# Patient Record
Sex: Male | Born: 1955
Health system: Southern US, Community
[De-identification: ages and names within clinical notes are randomized; demographics above are authoritative.]

## PROBLEM LIST (undated history)

## (undated) DIAGNOSIS — I10 Essential (primary) hypertension: Secondary | ICD-10-CM

## (undated) DIAGNOSIS — H9190 Unspecified hearing loss, unspecified ear: Secondary | ICD-10-CM

## (undated) DIAGNOSIS — F419 Anxiety disorder, unspecified: Secondary | ICD-10-CM

## (undated) DIAGNOSIS — M1990 Unspecified osteoarthritis, unspecified site: Secondary | ICD-10-CM

## (undated) DIAGNOSIS — K219 Gastro-esophageal reflux disease without esophagitis: Secondary | ICD-10-CM

## (undated) DIAGNOSIS — J189 Pneumonia, unspecified organism: Secondary | ICD-10-CM

## (undated) DIAGNOSIS — J449 Chronic obstructive pulmonary disease, unspecified: Secondary | ICD-10-CM

## (undated) DIAGNOSIS — E7800 Pure hypercholesterolemia, unspecified: Secondary | ICD-10-CM

## (undated) DIAGNOSIS — G4730 Sleep apnea, unspecified: Secondary | ICD-10-CM

## (undated) DIAGNOSIS — D649 Anemia, unspecified: Secondary | ICD-10-CM

## (undated) DIAGNOSIS — I2510 Atherosclerotic heart disease of native coronary artery without angina pectoris: Secondary | ICD-10-CM

## (undated) DIAGNOSIS — R519 Headache, unspecified: Secondary | ICD-10-CM

## (undated) DIAGNOSIS — C801 Malignant (primary) neoplasm, unspecified: Secondary | ICD-10-CM

## (undated) DIAGNOSIS — R51 Headache: Secondary | ICD-10-CM

## (undated) DIAGNOSIS — Z972 Presence of dental prosthetic device (complete) (partial): Secondary | ICD-10-CM

## (undated) DIAGNOSIS — Z8719 Personal history of other diseases of the digestive system: Secondary | ICD-10-CM

## (undated) DIAGNOSIS — R0600 Dyspnea, unspecified: Secondary | ICD-10-CM

## (undated) HISTORY — PX: CARDIAC CATHETERIZATION: SHX172

## (undated) HISTORY — PX: OTHER SURGICAL HISTORY: SHX169

## (undated) HISTORY — PX: APPENDECTOMY: SHX54

## (undated) HISTORY — PX: CATARACT EXTRACTION: SUR2

## (undated) HISTORY — PX: TONSILLECTOMY: SUR1361

## (undated) HISTORY — PX: CAROTID ENDARTERECTOMY: SUR193

## (undated) HISTORY — PX: CORONARY ARTERY BYPASS GRAFT: SHX141

---

## 2008-01-27 ENCOUNTER — Ambulatory Visit: Payer: Self-pay | Admitting: Gastroenterology

## 2008-11-01 ENCOUNTER — Ambulatory Visit: Payer: Self-pay | Admitting: Gastroenterology

## 2008-11-08 ENCOUNTER — Ambulatory Visit: Payer: Self-pay | Admitting: Gastroenterology

## 2011-06-04 ENCOUNTER — Ambulatory Visit: Payer: Self-pay

## 2011-06-26 ENCOUNTER — Ambulatory Visit: Payer: Self-pay

## 2011-11-25 ENCOUNTER — Ambulatory Visit: Payer: Self-pay | Admitting: Gastroenterology

## 2012-04-25 ENCOUNTER — Ambulatory Visit: Payer: Self-pay | Admitting: Physician Assistant

## 2012-04-25 ENCOUNTER — Observation Stay: Payer: Self-pay | Admitting: Surgery

## 2012-04-25 LAB — CBC WITH DIFFERENTIAL/PLATELET
Basophil: 2 %
Comment - H1-Com1: NORMAL
Eosinophil #: 0 10*3/uL (ref 0.0–0.7)
Eosinophil %: 0.2 %
HCT: 44 % (ref 40.0–52.0)
HGB: 15.2 g/dL (ref 13.0–18.0)
Lymphocyte #: 1.6 10*3/uL (ref 1.0–3.6)
Lymphocyte %: 7.1 %
Lymphocytes: 5 %
MCH: 32.3 pg (ref 26.0–34.0)
MCHC: 34.6 g/dL (ref 32.0–36.0)
Monocyte %: 6.1 %
Neutrophil %: 86 %
Platelet: 213 10*3/uL (ref 150–440)
RBC: 4.71 10*6/uL (ref 4.40–5.90)
Segmented Neutrophils: 89 %
WBC: 23.4 10*3/uL — ABNORMAL HIGH (ref 3.8–10.6)

## 2012-04-25 LAB — AMYLASE: Amylase: 53 U/L (ref 25–115)

## 2012-04-25 LAB — URINALYSIS, COMPLETE
Bilirubin,UR: NEGATIVE
Leukocyte Esterase: NEGATIVE
Ph: 7 (ref 4.5–8.0)
Protein: 30
RBC,UR: 6 /HPF (ref 0–5)
Specific Gravity: 1.015 (ref 1.003–1.030)
Squamous Epithelial: NONE SEEN
WBC UR: <1 /HPF (ref 0–5)

## 2012-04-25 LAB — COMPREHENSIVE METABOLIC PANEL
Albumin: 3.9 g/dL (ref 3.4–5.0)
Alkaline Phosphatase: 69 U/L (ref 50–136)
Anion Gap: 10 (ref 7–16)
BUN: 15 mg/dL (ref 7–18)
Chloride: 98 mmol/L (ref 98–107)
Creatinine: 0.97 mg/dL (ref 0.60–1.30)
EGFR (African American): >60
Osmolality: 274 (ref 275–301)
Potassium: 3.7 mmol/L (ref 3.5–5.1)
SGOT(AST): 22 U/L (ref 15–37)
Total Protein: 7.6 g/dL (ref 6.4–8.2)

## 2012-04-27 LAB — PATHOLOGY REPORT

## 2012-05-27 ENCOUNTER — Ambulatory Visit: Payer: Self-pay | Admitting: Vascular Surgery

## 2012-05-27 LAB — CREATININE, SERUM: EGFR (Non-African Amer.): >60

## 2012-12-08 DIAGNOSIS — I739 Peripheral vascular disease, unspecified: Secondary | ICD-10-CM | POA: Insufficient documentation

## 2013-12-06 DIAGNOSIS — S92302B Fracture of unspecified metatarsal bone(s), left foot, initial encounter for open fracture: Secondary | ICD-10-CM | POA: Insufficient documentation

## 2014-01-13 ENCOUNTER — Ambulatory Visit: Payer: Self-pay | Admitting: Vascular Surgery

## 2014-01-19 ENCOUNTER — Ambulatory Visit: Payer: Self-pay | Admitting: Vascular Surgery

## 2014-01-19 LAB — CREATININE, SERUM
Creatinine: 0.93 mg/dL (ref 0.60–1.30)
EGFR (African American): >60
EGFR (Non-African Amer.): >60

## 2014-01-19 LAB — BUN: BUN: 17 mg/dL (ref 7–18)

## 2014-02-22 ENCOUNTER — Ambulatory Visit: Payer: Self-pay | Admitting: Vascular Surgery

## 2014-02-22 LAB — BASIC METABOLIC PANEL
ANION GAP: 3 — AB (ref 7–16)
BUN: 12 mg/dL (ref 7–18)
CALCIUM: 8.6 mg/dL (ref 8.5–10.1)
CHLORIDE: 104 mmol/L (ref 98–107)
Co2: 32 mmol/L (ref 21–32)
Creatinine: 0.98 mg/dL (ref 0.60–1.30)
EGFR (African American): >60
EGFR (Non-African Amer.): >60
GLUCOSE: 95 mg/dL (ref 65–99)
Osmolality: 277 (ref 275–301)
Potassium: 3.8 mmol/L (ref 3.5–5.1)
Sodium: 139 mmol/L (ref 136–145)

## 2014-02-22 LAB — URINALYSIS, COMPLETE
BACTERIA: NONE SEEN
BLOOD: NEGATIVE
Bilirubin,UR: NEGATIVE
Glucose,UR: NEGATIVE mg/dL (ref 0–75)
KETONE: NEGATIVE
LEUKOCYTE ESTERASE: NEGATIVE
Nitrite: NEGATIVE
Ph: 8 (ref 4.5–8.0)
Protein: NEGATIVE
Specific Gravity: 1.011 (ref 1.003–1.030)
Squamous Epithelial: NONE SEEN

## 2014-02-22 LAB — CBC
HCT: 41.7 % (ref 40.0–52.0)
HGB: 14.2 g/dL (ref 13.0–18.0)
MCH: 31.8 pg (ref 26.0–34.0)
MCHC: 34.1 g/dL (ref 32.0–36.0)
MCV: 93 fL (ref 80–100)
Platelet: 241 10*3/uL (ref 150–440)
RBC: 4.47 10*6/uL (ref 4.40–5.90)
RDW: 14.1 % (ref 11.5–14.5)
WBC: 8.6 10*3/uL (ref 3.8–10.6)

## 2014-03-01 ENCOUNTER — Inpatient Hospital Stay: Payer: Self-pay | Admitting: Vascular Surgery

## 2014-03-02 LAB — BASIC METABOLIC PANEL
Anion Gap: 4 — ABNORMAL LOW (ref 7–16)
BUN: 13 mg/dL (ref 7–18)
CREATININE: 0.96 mg/dL (ref 0.60–1.30)
Calcium, Total: 7.6 mg/dL — ABNORMAL LOW (ref 8.5–10.1)
Chloride: 106 mmol/L (ref 98–107)
Co2: 29 mmol/L (ref 21–32)
EGFR (African American): >60
EGFR (Non-African Amer.): >60
Glucose: 123 mg/dL — ABNORMAL HIGH (ref 65–99)
Osmolality: 279 (ref 275–301)
POTASSIUM: 3.9 mmol/L (ref 3.5–5.1)
Sodium: 139 mmol/L (ref 136–145)

## 2014-03-02 LAB — CBC WITH DIFFERENTIAL/PLATELET
BASOS ABS: 0 10*3/uL (ref 0.0–0.1)
Basophil %: 0.2 %
EOS ABS: 0 10*3/uL (ref 0.0–0.7)
Eosinophil %: 0 %
HCT: 35 % — ABNORMAL LOW (ref 40.0–52.0)
HGB: 12.1 g/dL — AB (ref 13.0–18.0)
Lymphocyte #: 1.3 10*3/uL (ref 1.0–3.6)
Lymphocyte %: 10 %
MCH: 32.2 pg (ref 26.0–34.0)
MCHC: 34.6 g/dL (ref 32.0–36.0)
MCV: 93 fL (ref 80–100)
MONO ABS: 0.9 x10 3/mm (ref 0.2–1.0)
Monocyte %: 7 %
Neutrophil #: 10.7 10*3/uL — ABNORMAL HIGH (ref 1.4–6.5)
Neutrophil %: 82.8 %
PLATELETS: 210 10*3/uL (ref 150–440)
RBC: 3.77 10*6/uL — ABNORMAL LOW (ref 4.40–5.90)
RDW: 13.5 % (ref 11.5–14.5)
WBC: 13 10*3/uL — ABNORMAL HIGH (ref 3.8–10.6)

## 2014-03-02 LAB — PROTIME-INR
INR: 1.1
PROTHROMBIN TIME: 14 s (ref 11.5–14.7)

## 2014-03-02 LAB — APTT: ACTIVATED PTT: 28.1 s (ref 23.6–35.9)

## 2014-03-03 LAB — PATHOLOGY REPORT

## 2014-07-06 DIAGNOSIS — K219 Gastro-esophageal reflux disease without esophagitis: Secondary | ICD-10-CM | POA: Insufficient documentation

## 2014-07-06 DIAGNOSIS — F419 Anxiety disorder, unspecified: Secondary | ICD-10-CM | POA: Insufficient documentation

## 2014-12-19 DIAGNOSIS — G4733 Obstructive sleep apnea (adult) (pediatric): Secondary | ICD-10-CM | POA: Insufficient documentation

## 2015-01-10 DIAGNOSIS — L409 Psoriasis, unspecified: Secondary | ICD-10-CM | POA: Insufficient documentation

## 2015-01-16 ENCOUNTER — Ambulatory Visit: Payer: Self-pay | Admitting: Ophthalmology

## 2015-01-19 DIAGNOSIS — I447 Left bundle-branch block, unspecified: Secondary | ICD-10-CM | POA: Insufficient documentation

## 2015-01-23 ENCOUNTER — Ambulatory Visit: Payer: Self-pay | Admitting: Internal Medicine

## 2015-01-25 ENCOUNTER — Ambulatory Visit: Payer: Self-pay | Admitting: Ophthalmology

## 2015-02-06 DIAGNOSIS — I2510 Atherosclerotic heart disease of native coronary artery without angina pectoris: Secondary | ICD-10-CM | POA: Insufficient documentation

## 2015-03-31 NOTE — Op Note (Signed)
PATIENT NAME:  Charles Blake, Charles Blake MR#:  [id number] DATE OF BIRTH:  02/25/1956  DATE OF PROCEDURE:  01/19/2014  PREOPERATIVE DIAGNOSES:  1.  Peripheral arterial disease with claudication, bilateral lower extremities, left worse than right.  2.  Carotid disease.  3.  Tobacco dependence.  4.  Hypertension.  POSTOPERATIVE DIAGNOSES: 1.  Peripheral arterial disease with claudication, bilateral lower extremities, left worse than right.  2.  Carotid disease.  3.  Tobacco dependence.  4.  Hypertension.  PROCEDURES:  1.  Ultrasound guidance for vascular access to bilateral femoral arteries.  2.  Catheter placement to aorta from bilateral femoral approaches.  3.  Aortogram and iliofemoral arteriogram.  4.  Kissing balloon angioplasty with 6 mm Lutonix drug-coated balloons to bilateral common iliac arteries and distal aorta.  5.  StarClose closure device, bilateral femoral artery.   SURGEON: Leotis Pain, M.D.   ANESTHESIA: Local with moderate conscious sedation.   ESTIMATED BLOOD LOSS: 25 mL.  FLUOROSCOPY TIME: Approximately 4 minutes.   INDICATION FOR PROCEDURE: This is a 59 year old male with extensive atherosclerotic peripheral arterial disease. He has undergone previous interventions. Despite his young age, he has recurrent claudication symptoms particularly in the left. Noninvasive study showed bilateral common iliac artery elevated velocities and irregularity in the distal aorta. He is brought in for angiography for further evaluation and potential treatment. Risks and benefits were discussed. Informed consent was obtained.   DESCRIPTION OF PROCEDURE: The patient is brought to the vascular interventional radiology suite. Groins were draped and prepped, and a sterile surgical field was created. Ultrasound was used to visualize the femoral arteries. These are accessed under direct ultrasound guidance without difficulty with a Seldinger needle and permanent image was recorded. A 6-French sheath  was placed bilaterally. Intravenous heparin  4000 units given for systemic anticoagulation. Pigtail catheter was placed in the aorta and an AP aortogram of the pelvic obliques performed. This showed normal origins of the visceral vessels. The aorta had moderate irregularity in its distal segment. The left common iliac artery had a moderate stenosis in the 65% to 70% range proximally above the previously placed left iliac stent and there was moderate irregularity within the right common iliac stent with the degree of stenosis difficult to opacify. This appeared to be in the 60% range. The lesions were crossed without difficulty. Magic torque wires were placed bilaterally. 6 mm diameter Lutonix drug-coated balloons were inflated in the proximal common iliac arteries bilaterally encompassing both lesions going down into the stent in the left common iliac artery, encompassing almost the entirety of the stent in the right common iliac artery. These also went to the aorta for several centimeters and the distal aorta was treated as well. These were inflated to burst pressure. The balloons were then deflated and completion angiogram was performed. This showed significant improvement with less than 30% residual stenosis in the left common iliac artery, less than 20% residual stenosis in the right iliac artery and a mild residual stenosis in the 30% to 40% range in the distal aorta. At this point, I elected to terminate the procedure. Sheaths were removed. StarClose closure device was deployed in the usual fashion with excellent hemostatic result. The patient tolerated the procedure well and was taken to the recovery room in stable condition.    ____________________________ Charles Huxley, MD jsd:aw D: 01/19/2014 10:51:54 ET T: 01/19/2014 11:20:57 ET JOB#: [id number]  cc: Charles Huxley, MD, <Dictator> Charles Huxley MD ELECTRONICALLY SIGNED 01/25/2014 10:51

## 2015-03-31 NOTE — Op Note (Signed)
PATIENT NAME:  Charles Blake, COUPER MR#:  [id number] DATE OF BIRTH:  [birth year]  DATE OF PROCEDURE:  03/01/2014  PREOPERATIVE DIAGNOSES: 1. High-grade left carotid artery stenosis.  2. Moderate right carotid artery stenosis.  3. Peripheral vascular disease.  4. Tobacco dependence.   POSTOPERATIVE DIAGNOSES:  1. High-grade left carotid artery stenosis.  2. Moderate right carotid artery stenosis.  3. Peripheral vascular disease.  4. Tobacco dependence.  PROCEDURE: Left carotid endarterectomy with CorMatrix arterial patch reconstruction.   SURGEON: Algernon Huxley, M.D.   ANESTHESIA: General.   ESTIMATED BLOOD LOSS: 150 mL.   INDICATION FOR PROCEDURE: This is a 59 year old gentleman with recent diagnosis of high-grade left carotid artery stenosis, moderate right carotid artery stenosis and peripheral vascular disease, which has already been treated. He was offered carotid endarterectomy for stroke risk reduction and desired to proceed. Risks and benefits were discussed.   DESCRIPTION OF PROCEDURE: The patient is brought to the operative suite, and after an adequate level of general anesthesia was obtained, he was placed in modified beach chair position. His neck was flexed. A roll was placed under shoulders and his neck and chest were sterilely prepped and draped. Incision was then created along the anterior border of the sternocleidomastoid and we dissected through the platysma with electrocautery Wheatland retractor was used to help facilitate our exposure. He had thick, deep neck and two larger Wheatlanders were necessary. Several crossing veins were ligated and divided between silk ties. The facial vein was ligated and divided between silk ties. This exposed the carotid bifurcation. The common carotid artery, external carotid, superior thyroid artery and internal carotid artery and distal lesion were dissected out. The lesion tracked well into the internal carotid artery and we had to ligate  several branches around the hypoglossal nerve to move it out of the way and protect it. The patient was heparinized with 7000 units of intravenous heparin. Control was pulled up on the vessel loops. An anterior wall arteriotomy was created with an 11 blade and extended with Potts scissors. The Pruitt-Inahara shunt was placed first in the internal carotid artery, flushed and de-aired, then in the common carotid artery. Approximately four minutes elapsed from clamp until restoration of flow. I then performed endarterectomy in a standard fashion. The proximal endpoint was cut flush with tenotomy scissors. An eversion endarterectomy was performed on the external carotid artery and a nice feathered distal endpoint was created on the internal carotid artery. The internal carotid artery distal endpoint was tacked down with two 7-0 Prolene sutures and all loose flecks were removed. The vessel was locally heparinized. A CorMatrix patch was brought onto the field, and I cut and beveled. A 6-0 Prolene was started at the distal endpoint and run half the length of the arteriotomy medially and laterally. Then cut and beveled to an appropriate length to match the arteriotomy. A second 6-0 Prolene was started at the proximal endpoint. The medial suture line was run together and tied. The lateral suture line was run together until the shunt had to be removed. The shunt was removed. The vessel was flushed in the internal, external and common carotid arteries and locally heparinized. I then completed the suture line flushing through the external carotid artery with several cardiac cycles prior to releasing control. Approximately three minutes elapsed from shunt removal to restoration of flow. Two 6-0 Prolene patches were used for hemostasis Surgicel and Evicel were placed. The wound was then closed with 3 interrupted 3-0 Vicryl sternocleidomastoid space. The platysma was  closed with a running 3-0 Vicryl and the skin was closed with  4-0 Monocryl. Dermabond was placed as a dressing. The patient was awakened from anesthesia and taken to the recovery room in stable condition, having tolerated the procedure well.   ____________________________ Algernon Huxley, MD jsd:sg D: 03/01/2014 10:26:00 ET T: 03/01/2014 11:53:40 ET JOB#: [id number]  cc: Algernon Huxley, MD, <Dictator> Sofie Hartigan, MD  Algernon Huxley MD ELECTRONICALLY SIGNED 03/13/2014 9:49

## 2015-04-01 NOTE — Consult Note (Signed)
Patient admitted with acute appendicitis. Had appendectomy and is going home today.  Found to have aortoiliac disease on his Ct scan, which I have independently reviewed.  This shows what appears to be a right iliac occlusion, left iliac stenosis, and some aortic disease.  He does have short distance claudication and has atherosclerotic risk factors including current tobacco use.  Discussed pathophysiology and natural history of PAD.  Will see in office as an outpatient and schedule ABIs for further evaluation.    Electronic Signatures: Algernon Huxley (MD)  (Signed on 20-May-13 11:48)  Authored  Last Updated: 20-May-13 11:48 by Algernon Huxley (MD)

## 2015-04-01 NOTE — Consult Note (Signed)
PATIENT NAME:  Charles Blake, Charles Blake MR#:  [id number] DATE OF BIRTH:  07-13-1956  DATE OF CONSULTATION:  04/26/2012  REFERRING PHYSICIAN:   CONSULTING PHYSICIAN:  Algernon Huxley, MD  REASON FOR CONSULTATION: Peripheral arterial disease with claudication.   HISTORY OF PRESENT ILLNESS: This is a patient admitted with acute appendicitis who had an appendectomy yesterday and is going home today. On his CT scan is mention of aortoiliac arterial disease. I have independently reviewed this scan. This appears to show a right iliac occlusion, left iliac stenosis, and some aortic disease.  In discussion with the patient he does have reasonably short-distance claudication. He does not have rest pain or tissue loss. He has multiple atherosclerotic risk factors including ongoing tobacco use and the importance of tobacco cessation was discussed with him today. We discussed the pathophysiology and natural history of peripheral arterial disease as well today and he does seem to fit the profile.   PAST MEDICAL HISTORY:  1. Tobacco use. 2. Hypertension.  HOME MEDICATIONS: Amlodipine, Hyzaar, Lexapro, and Protonix.   ALLERGIES: None known.   FAMILY HISTORY: Noncontributory.   SOCIAL HISTORY: Positive alcohol and tobacco use. 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   REVIEW OF SYSTEMS:  GENERAL: No fevers or chills though he did have his appendix taken out yesterday. EYES: No blurred or double vision.  EARS:  No tinnitus or ear pain. CARDIOVASCULAR: No chest pain or palpitations. RESPIRATORY: No shortness breath or cough. GI: Positive for recent appendectomy. GU: No dysuria or hematuria. ENDOCRINE: No heat or cold intolerance. PSYCH: No anxiety or depression. NEURO: No transient ischemic attack, stroke, or seizure. MUSCULOSKELETAL: Positive for claudication as described above.     PHYSICAL EXAMINATION:  GENERAL: This is well-developed, well-nourished white male not in apparent distress.   VITAL SIGNS: Temperature 98.9,  pulse 94, blood pressure 120/72, saturations are 94%.   HEAD: Normocephalic, atraumatic.   EYES: Sclerae anicteric. Conjunctivae are clear.   EARS: Normal external appearance. Hearing is intact.   NECK: Neck is supple without adenopathy or jugular venous distention.   HEART: Regular rate and rhythm without murmurs, rubs, gallops.   LUNGS: Clear to auscultation bilaterally.   ABDOMEN: Soft. He has some mild tenderness around the incision, which is expected. He does not have rebound or guarding.   EXTREMITIES: His right femoral pulse is not palpable. His left femoral pulse is 1+. His right pedal pulses are trace and his left pedal pulses are 1+. Both feet have good capillary refill.   NEUROLOGIC: Normal strength and tone in all four extremities.   PSYCH: No anxiety or depression.   SKIN: Warm and dry.   LABORATORY, DIAGNOSTIC, AND RADIOLOGICAL DATA: Admission labs showing sodium 136, potassium 3.7, chloride 98, CO2 28, BUN 15, creatinine 0.97, glucose 127. White blood cell count is 23.4, hemoglobin 15.2, platelet count 213,000. CT scan of the abdomen and pelvis was performed which I have independently reviewed and described above.   ASSESSMENT AND PLAN: This is a 59 year old white male with what sounds like reasonably short distance hip and buttock claudication from aortoiliac disease. Right now he will need to recover from his appendectomy and we will arrange to see him in the office with ABIs for further followup.    Smoking cessation was discussed the patient as was lifestyle modifications for peripheral arterial disease.   This is a level-4 consultation.    ____________________________ Algernon Huxley, MD jsd:bjt D: 05/13/2012 13:41:15 ET T: 05/13/2012 14:16:51 ET JOB#: [id number]  cc: Corene Cornea  Bunnie Domino, MD, <Dictator> Algernon Huxley MD ELECTRONICALLY SIGNED 05/27/2012 9:04

## 2015-04-01 NOTE — H&P (Signed)
History of Present Illness Started having RLQ abdominal pain associated with nausea and anorexia this AM - getting worse. No fever. Last meal 6PM last night.   Past Med/Surgical Hx:  Hypertension:   ALLERGIES:  No Known Allergies:   HOME MEDICATIONS: Medication Instructions Status  amlodipine  orally  Active  Hyzaar  orally  Active  Lexapro  orally  Active  Protonix  orally  Active   Family and Social History:   Family History Non-Contributory    Social History positive tobacco (Greater than 1 year), positive ETOH, 1 ppd, works at [employer]   Review of Systems:   Subjective/Chief Complaint has classic 50 yard vbilateral buttock and hip claudication    Fever/Chills No    Cough No    Sputum No    Abdominal Pain Yes    Diarrhea No    Constipation No    Nausea/Vomiting Yes    SOB/DOE No    Chest Pain No    Dysuria No    Tolerating PT Yes    Tolerating Diet No  Nauseated    Medications/Allergies Reviewed Medications/Allergies reviewed   Physical Exam:   GEN well developed, well nourished, no acute distress    HEENT pink conjunctivae, PERRL, hearing intact to voice, moist oral mucosa, Oropharynx clear    NECK supple  thyroid not tender  trachea midline    RESP normal resp effort  clear BS    CARD regular rate  no murmur  no Rub    ABD positive tenderness  rebound and guarding RLQ    LYMPH negative neck    EXTR negative edema    SKIN normal to palpation, No ulcers, skin turgor good    NEURO cranial nerves intact, follows commands, strength:, motor/sensory function intact    PSYCH alert, A+O to time, place, person, good insight   Routine UA:  19-May-13 15:44    Color (UA) Yellow   Clarity (UA) Clear   Glucose (UA) Negative   Bilirubin (UA) Negative   Ketones (UA) 1+   Specific Gravity (UA) 1.015   Blood (UA) 1+   pH (UA) 7.0   Protein (UA) 30 mg/dL   Nitrite (UA) Negative   Leukocyte Esterase (UA)  Negative   RBC (UA) 6 /HPF   WBC (UA) <1 /HPF   Radiology Results: CT:    19-May-13 16:15, CT Abdomen and Pelvis With Contrast   CT Abdomen and Pelvis With Contrast   REASON FOR EXAM:    (1) RLQ pain; (2) RLQ pain  COMMENTS:       PROCEDURE: CT  - CT ABDOMEN / PELVIS  W  - Apr 25 2012  4:15PM     RESULT: Comparison:  None    Technique: Multiple axial images of the abdomen and pelvis were performed   from the lung bases to the pubic symphysis, with p.o. contrast and with   100 mL of Isovue 370 intravenous contrast.    Findings:  There is a small hiatal hernia. Minimal low-attenuation along the   falciform ligament likely represents focal fatty deposition. The   gallbladder, spleen, adrenals, and pancreas are unremarkable. The kidneys     enhance normally.    The small and large bowel are normal in caliber. There are several   prominent perirectal vessels, which is nonspecific. There is an   appendicolith inthe base of the appendix. The appendix is enlarged,   measuring 14 mm in  diameter. There is mild adjacent inflammatory   stranding. No discrete extraluminal fluid collection identified. There is   a fat-containing left inguinal hernia. There is a prominent degree of fat   in the right inguinal canal. Atherosclerotic plaques are demonstrated in   the abdominal aorta and iliac arteries. There is a high-grade stenosis in   the right common iliac artery.    No aggressive lytic or sclerotic osseous lesions are identified. There   are pars defects at L5 bilaterally.    IMPRESSION:   1. Acute appendicitis.  This was called to Dr. Graciella Freer at 1620   hours 04/25/2012.  2. High-grade stenosis in the right common iliac artery.          Verified By: Gregor Hams, M.D., MD     Assessment/Admission Diagnosis Acute appendicitis Aortoiliac PVOD    Plan Lap appy Vascular consult in AM   Electronic Signatures: Consuela Mimes (MD)  (Signed 19-May-13  16:50)  Authored: CHIEF COMPLAINT and HISTORY, PAST MEDICAL/SURGIAL HISTORY, ALLERGIES, HOME MEDICATIONS, FAMILY AND SOCIAL HISTORY, REVIEW OF SYSTEMS, PHYSICAL EXAM, LABS, Radiology, ASSESSMENT AND PLAN   Last Updated: 19-May-13 16:50 by Consuela Mimes (MD)

## 2015-04-01 NOTE — Op Note (Signed)
PATIENT NAME:  Charles Blake, Charles Blake MR#:  [id number] DATE OF BIRTH:  [birth year]  DATE OF PROCEDURE:  04/25/2012  PREOPERATIVE DIAGNOSIS: Acute appendicitis.   POSTOPERATIVE DIAGNOSIS: Acute appendicitis, gangrenous.   OPERATION PERFORMED: Laparoscopic appendectomy.   SURGEON: Consuela Mimes, M.D.   ANESTHESIA: General.   PROCEDURE IN DETAIL: The patient was placed supine on the operating room table and prepped and draped in the usual sterile fashion. A limited midline incision was made above the umbilicus for the insertion of a Hassan cannula and after cutting through the linea alba a preperitoneal artery was cut which necessitated enlargement of the skin incision and slight enlargement of the fascial incision. Multiple hemoclips were placed on this artery as well as a 3-0 silk ligature and then the fascia was partially closed with running 0 PDS suture and the peritoneum was entered and the Hassan cannula was introduced and a 15 mmHg CO2 pneumoperitoneum was created. Two additional 5 mm trocars were placed under direct visualization. The patient was noted to have a gangrenous appendix with minimal fluid surrounding it, but no true perforation. The mesoappendix was taken down with the Harmonic scalpel and again appendicular artery necessitated clipping with endoscopic hemoclips and then the appendectomy was performed right at the junction of the appendiceal base and the cecum with an Endo GIA stapling device. The appendix was placed in an Endo Catch bag and extracted from the abdomen via the epigastric port. The right lower quadrant was irrigated with warm normal saline. This was suctioned clear including the pelvis. Hemostasis was excellent. The peritoneum was desufflated and decannulated and the linea alba closure with a running 0 PDS          was completed and all three skin sites were closed with skin staples and sterile dressings were applied. The patient tolerated the procedure well.  There were no complications.  ____________________________ Consuela Mimes, MD wfm:slb D: 04/25/2012 19:18:00 ET T: 04/26/2012 08:42:01 ET JOB#: [id number]  cc: Consuela Mimes, MD, <Dictator> Consuela Mimes MD ELECTRONICALLY SIGNED 04/28/2012 9:20

## 2015-04-01 NOTE — Op Note (Signed)
PATIENT NAME:  Charles Blake, Charles Blake MR#:  [id number] DATE OF BIRTH:  03/04/1956  DATE OF PROCEDURE:  05/27/2012  PREOPERATIVE DIAGNOSES:  1. Peripheral arterial disease with claudication bilateral lower extremities.  2. Hypertension.   POSTOPERATIVE DIAGNOSES: 1. Peripheral arterial disease with claudication bilateral lower extremities.  2. Hypertension.  PROCEDURES:  1. Ultrasound guidance for vascular access, left femoral artery.  2. Catheter placement to right femoral artery from left femoral approach.  3. Aortogram and selective right lower extremity angiogram.  4. Percutaneous transluminal angioplasty of bilateral iliac arteries with 7 mm diameter angioplasty balloon.  5. Balloon expandable stent placement to proximal right common iliac artery with 7 mm diameter x 4 cm length balloon expandable stent.  6. Self-expanding stent placement to distal left common iliac artery at the hypogastric origin with an 8 mm diameter self-expanding stent, both of these for greater than 50% residual stenosis after angioplasty.  7. StarClose closure device left femoral artery.   SURGEON: Algernon Huxley, MD   ANESTHESIA: Local with moderate conscious sedation.   ESTIMATED BLOOD LOSS: Minimal.   INDICATION FOR PROCEDURE: This is a gentleman who was referred for claudication. He had significantly reduced ABIs bilaterally and aortoiliac disease had been seen on a previous CT although this was not a CT angiogram so evaluation was somewhat limited. His claudication was lifestyle limiting and he desired revascularization. Risks and benefits were discussed. Informed consent was obtained.   DESCRIPTION OF PROCEDURE: The patient was brought to the Vascular Interventional Radiology Suite. Groins were shaved and prepped and a sterile surgical field was created. The left femoral head was localized with fluoroscopy and the left femoral artery was accessed without difficulty with a Seldinger needle under direct ultrasound  guidance. Permanent image was recorded. J-wire and 5 French sheath were placed. There was a near occlusion in the distal left common iliac artery seen on a retrograde arteriogram through the left femoral sheath. I was able to cross this lesion without difficulty with the Terumo advantage wire and park a pigtail catheter in the aorta. Aortogram showed significant disease was seen in the aorta but this was not flow-limiting. The right common iliac artery occluded approximately 1 cm beyond the origin and there was a near occlusion in the distal left common iliac artery. I took a rim catheter with the advantage wire and was able to cross this occlusion with surprisingly little difficulty and confirm intraluminal flow in the right femoral artery. Right lower extremity angiogram showed no significant infrarenal disease. I then parked a Terumo advantage wire while in the superficial femoral artery. Balloon angioplasty was performed of both iliacs separately with a 7 mm diameter angioplasty balloon and both iliacs had a greater than 50% residual stenosis after angioplasty. Due to locations, a balloon expandable stent was placed on the right and a self-expanding stent was placed on the left. The balloon expandable was a 7 x 4 and the self-expanding stent was an 8 x 4. The self-expanding stent was ironed out with a 7 mm balloon. Completion angiogram after the stent placement showed widely patent stents without significant residual stenosis and StarClose closure device was deployed in the usual fashion with excellent hemostatic result. The patient tolerated the procedure well and was taken to the recovery room in stable condition.   ____________________________ Algernon Huxley, MD jsd:drc D: 05/27/2012 12:58:55 ET T: 05/27/2012 13:07:39 ET JOB#: [id number]  cc: Algernon Huxley, MD, <Dictator> Algernon Huxley MD ELECTRONICALLY SIGNED 06/03/2012 9:22

## 2015-04-08 NOTE — Op Note (Signed)
PATIENT NAME:  Charles Blake, Charles Blake MR#:  [id number] DATE OF BIRTH:  October 06, 1956  DATE OF PROCEDURE:  01/25/2015  PREOPERATIVE DIAGNOSIS:  Nuclear sclerotic cataract, left eye. Diagnosis code H25.12.  POSTOPERATIVE DIAGNOSIS:  Nuclear sclerotic cataract, left eye. Diagnosis code H25.12.  ANESTHESIA: Topical anesthesia.  PROCEDURE:  Phacoemulsification with posterior chamber intraocular lens left eye, model SN60WF  SURGEON:  Lyla Glassing, MD  INDICATIONS:  This is a 59 year old male with decreased vision in the left eye.  PROCEDURE:  The risks and benefits of cataract surgery were discussed at length with the patient, including bleeding, infection, retinal detachment, re-operation, diplopia, ptosis, loss of vision, and loss of the eye. Informed consent was obtained. On the day of surgery, several sets of preoperative medication were administered to the operative eye including 0.5% tetracaine,1% cyclopentolate, 10% phenylephrine, 0.5% ketorolac, 0.5% gatifloxacin, and 2% lidocaine .  The patient was taken to the operating room and sedated via IV sedation. Topical tetracaine was placed in the eye. The operative eye was prepped using a diluted 10% Betadine solution and then covered in sterile drapes leaving only the operative eye exposed. A Lieberman lid speculum was placed to provide exposure. Using 0.12 forceps and a sideport blade, a paracentesis was created. Then a mixture of BSS, preservative free lidocaine, and epinephrine was injected into the anterior chamber. Next, a 2.4 mm keratome blade was used to create a two-step full-thickness clear corneal incision temporally. The cystitome and Utrata forceps were used to create a continuous capsulorrhexis in the anterior lens capsule. BSS on a hydrodissection cannula was used to perform gentle hydrodissection. Phacoemulsification was then performed to remove the nucleus. Irrigation and aspiration was performed to remove the remaining cortical material.  Provisc was injected to fill the capsular bag and anterior chamber. A 23.5-diopter SN60WF intraocular lens was injected into the capsular bag. The Connor wand was used to rotate it into proper position in the capsular bag. Irrigation and aspiration was performed to remove the remaining Viscoelastic material from the eye. BSS on a 30-gauge cannula was used to hydrate the wound. An intracameral antibiotic was administered. The wounds were checked and found to be watertight. The lid speculum and drapes were carefully removed. Several drops of Vigamox were placed in the operative eye. The eye was covered with protective eyewear. The patient was taken to the recovery area in good condition. There were no complications.   ____________________________ Lyla Glassing, MD nm:mw D: 01/25/2015 10:56:07 ET T: 01/25/2015 11:17:08 ET JOB#: [id number]  cc: Lyla Glassing, MD, <Dictator> Lyla Glassing MD ELECTRONICALLY SIGNED 02/01/2015 7:41

## 2015-05-28 ENCOUNTER — Encounter: Payer: Self-pay | Admitting: *Deleted

## 2015-05-29 ENCOUNTER — Encounter
Admission: RE | Disposition: A | Discharge status: Home / Self Care | Payer: Self-pay | Source: Ambulatory Visit | Attending: Gastroenterology

## 2015-05-29 ENCOUNTER — Ambulatory Visit: Payer: BLUE CROSS/BLUE SHIELD | Admitting: Anesthesiology

## 2015-05-29 ENCOUNTER — Ambulatory Visit
Admission: RE | Admit: 2015-05-29 | Discharge: 2015-05-29 | Disposition: A | Discharge status: Home / Self Care | Payer: BLUE CROSS/BLUE SHIELD | Source: Ambulatory Visit | Attending: Gastroenterology | Admitting: Gastroenterology

## 2015-05-29 DIAGNOSIS — Z79899 Other long term (current) drug therapy: Secondary | ICD-10-CM | POA: Insufficient documentation

## 2015-05-29 DIAGNOSIS — K219 Gastro-esophageal reflux disease without esophagitis: Secondary | ICD-10-CM | POA: Insufficient documentation

## 2015-05-29 DIAGNOSIS — Z800 Family history of malignant neoplasm of digestive organs: Secondary | ICD-10-CM | POA: Diagnosis not present

## 2015-05-29 DIAGNOSIS — K5730 Diverticulosis of large intestine without perforation or abscess without bleeding: Secondary | ICD-10-CM | POA: Insufficient documentation

## 2015-05-29 DIAGNOSIS — Z7982 Long term (current) use of aspirin: Secondary | ICD-10-CM | POA: Insufficient documentation

## 2015-05-29 DIAGNOSIS — K621 Rectal polyp: Secondary | ICD-10-CM | POA: Diagnosis not present

## 2015-05-29 DIAGNOSIS — Z8371 Family history of colonic polyps: Secondary | ICD-10-CM | POA: Insufficient documentation

## 2015-05-29 DIAGNOSIS — M1990 Unspecified osteoarthritis, unspecified site: Secondary | ICD-10-CM | POA: Diagnosis not present

## 2015-05-29 DIAGNOSIS — K449 Diaphragmatic hernia without obstruction or gangrene: Secondary | ICD-10-CM | POA: Insufficient documentation

## 2015-05-29 DIAGNOSIS — K648 Other hemorrhoids: Secondary | ICD-10-CM | POA: Diagnosis not present

## 2015-05-29 DIAGNOSIS — Z1211 Encounter for screening for malignant neoplasm of colon: Secondary | ICD-10-CM | POA: Insufficient documentation

## 2015-05-29 DIAGNOSIS — I10 Essential (primary) hypertension: Secondary | ICD-10-CM | POA: Diagnosis not present

## 2015-05-29 HISTORY — PX: COLONOSCOPY: SHX5424

## 2015-05-29 HISTORY — DX: Personal history of other diseases of the digestive system: Z87.19

## 2015-05-29 HISTORY — DX: Essential (primary) hypertension: I10

## 2015-05-29 HISTORY — DX: Gastro-esophageal reflux disease without esophagitis: K21.9

## 2015-05-29 HISTORY — DX: Unspecified osteoarthritis, unspecified site: M19.90

## 2015-05-29 SURGERY — COLONOSCOPY
Anesthesia: General

## 2015-05-29 MED ORDER — SODIUM CHLORIDE 0.9 % IV SOLN: INTRAVENOUS | Status: DC

## 2015-05-29 MED ORDER — FENTANYL CITRATE (PF) 100 MCG/2ML IJ SOLN
INTRAMUSCULAR | Status: DC | PRN
  Administered 2015-05-29: 50 ug via INTRAVENOUS

## 2015-05-29 MED ORDER — MIDAZOLAM HCL 2 MG/2ML IJ SOLN
INTRAMUSCULAR | Status: DC | PRN
  Administered 2015-05-29: 1 mg via INTRAVENOUS

## 2015-05-29 NOTE — Anesthesia Postprocedure Evaluation (Signed)
  Anesthesia Post-op Note  Patient: Charles Blake  Procedure(s) Performed: Procedure(s): COLONOSCOPY (N/A)  Anesthesia type:General  Patient location: PACU  Post pain: Pain level controlled  Post assessment: Post-op Vital signs reviewed, Patient's Cardiovascular Status Stable, Respiratory Function Stable, Patent Airway and No signs of Nausea or vomiting  Post vital signs: Reviewed and stable  Last Vitals:  Filed Vitals:   05/29/15 1420  BP: 138/86  Pulse: 62  Temp:   Resp: 18    Level of consciousness: awake, alert  and patient cooperative  Complications: No apparent anesthesia complications

## 2015-05-29 NOTE — H&P (Signed)
Outpatient short stay form Pre-procedure 05/29/2015 1:20 PM Lollie Sails MD   Primary Physician: Dr Thereasa Distance  Reason for visit:  colonoscopy  History of present illness:  Patient with a family history of colon cancer and colon polyps. last colonoscopy was 11/ 2012. Tolerated his prep well. He is not taking any anticoagulation or aspirin medications.    Current facility-administered medications:  .  0.9 %  sodium chloride infusion, , Intravenous, Continuous, Lollie Sails, MD  Prescriptions prior to admission  Medication Sig Dispense Refill Last Dose  . acetaminophen (TYLENOL) 650 MG CR tablet Take 650 mg by mouth every 8 (eight) hours as needed for pain.     Marland Kitchen amLODipine (NORVASC) 10 MG tablet Take 10 mg by mouth daily.   05/29/2015 at 0700  . aspirin 81 MG tablet Take 81 mg by mouth daily.   05/29/2015 at 0700  . carvedilol (COREG) 3.125 MG tablet Take 3.125 mg by mouth 2 (two) times daily with a meal.   05/29/2015 at 0700  . clonazePAM (KLONOPIN) 0.5 MG tablet Take 0.5 mg by mouth at bedtime.   05/29/2015 at 0700  . escitalopram (LEXAPRO) 20 MG tablet Take 20 mg by mouth daily.   05/29/2015 at 0700  . losartan-hydrochlorothiazide (HYZAAR) 100-25 MG per tablet Take 1 tablet by mouth daily.   05/29/2015 at 0700  . pantoprazole (PROTONIX) 40 MG tablet Take 40 mg by mouth daily.   05/29/2015 at 0700  . pravastatin (PRAVACHOL) 80 MG tablet Take 80 mg by mouth daily.     . tadalafil (CIALIS) 20 MG tablet Take 20 mg by mouth daily as needed for erectile dysfunction.   Not Taking at Unknown time     Allergies  Allergen Reactions  . Ace Inhibitors      Past Medical History  Diagnosis Date  . Hypertension   . GERD (gastroesophageal reflux disease)   . Arthritis   . History of hiatal hernia     Review of systems:      Physical Exam    Heart and lungs: Regular rate and rhythm without rub or gallop lungs are bilaterally clear    HEENT: Norm cephalic atraumatic eyes  are anicteric    Other:     Pertinant exam for procedure: Soft nontender nondistended bowel sounds positive normoactive    Planned proceedures: Colonoscopy and indicated procedures I have discussed the risks benefits and complications of procedures to include not limited to bleeding, infection, perforation and the risk of sedation and the patient wishes to proceed.    Lollie Sails, MD Gastroenterology 05/29/2015  1:20 PM

## 2015-05-29 NOTE — Anesthesia Preprocedure Evaluation (Addendum)
Anesthesia Evaluation  Patient identified by MRN, date of birth, ID band Patient awake    Reviewed: Allergy & Precautions, NPO status , Patient's Chart, lab work & pertinent test results, reviewed documented beta blocker date and time   History of Anesthesia Complications Negative for: history of anesthetic complications  Airway Mallampati: II  TM Distance: >3 FB Neck ROM: Full    Dental  (+) Upper Dentures   Pulmonary sleep apnea and Continuous Positive Airway Pressure Ventilation ,  breath sounds clear to auscultation  Pulmonary exam normal       Cardiovascular hypertension, Pt. on medications and Pt. on home beta blockers + CAD Normal cardiovascular examRhythm:Regular Rate:Normal     Neuro/Psych negative neurological ROS  negative psych ROS   GI/Hepatic Neg liver ROS, hiatal hernia, GERD-  Medicated and Controlled,  Endo/Other  negative endocrine ROS  Renal/GU negative Renal ROS  negative genitourinary   Musculoskeletal  (+) Arthritis -, Osteoarthritis,    Abdominal   Peds negative pediatric ROS (+)  Hematology negative hematology ROS (+)   Anesthesia Other Findings   Reproductive/Obstetrics negative OB ROS                           Anesthesia Physical Anesthesia Plan  ASA: III  Anesthesia Plan: General   Post-op Pain Management:    Induction: Intravenous  Airway Management Planned: Nasal Cannula  Additional Equipment:   Intra-op Plan:   Post-operative Plan:   Informed Consent: I have reviewed the patients History and Physical, chart, labs and discussed the procedure including the risks, benefits and alternatives for the proposed anesthesia with the patient or authorized representative who has indicated his/her understanding and acceptance.     Plan Discussed with: CRNA and Surgeon  Anesthesia Plan Comments:        Anesthesia Quick Evaluation

## 2015-05-29 NOTE — Transfer of Care (Signed)
Immediate Anesthesia Transfer of Care Note  Patient: Charles Blake  Procedure(s) Performed: Procedure(s): COLONOSCOPY (N/A)  Patient Location: PACU  Anesthesia Type:General  Level of Consciousness: awake and alert   Airway & Oxygen Therapy: Patient Spontanous Breathing and Patient connected to nasal cannula oxygen  Post-op Assessment: Report given to RN  Post vital signs: stable  Last Vitals:  Filed Vitals:   05/29/15 1350  BP: 134/74  Pulse: 80  Temp: 36.3 C  Resp: 18    Complications: No apparent anesthesia complications

## 2015-05-29 NOTE — Op Note (Signed)
[facility] Gastroenterology Patient Name: Charles Blake Procedure Date: 05/29/2015 1:08 PM MRN: [id number] Account #: [id number] Date of Birth: 1956/03/02 Admit Type: Outpatient Age: 59 Room: [facility] ENDO ROOM 3 Gender: Male Note Status: Finalized Procedure:         Colonoscopy Indications:       Colon cancer screening in patient at increased risk:                     Family history of colon polyps, Screening in patient at                     increased risk: Family history of 1st-degree relative with                     colorectal cancer Providers:         Lollie Sails, MD Referring MD:      Sofie Hartigan (Referring MD) Medicines:         Monitored Anesthesia Care Complications:     No immediate complications. Procedure:         Pre-Anesthesia Assessment:                    - ASA Grade Assessment: II - A patient with mild systemic                     disease.                    After obtaining informed consent, the colonoscope was                     passed under direct vision. Throughout the procedure, the                     patient's blood pressure, pulse, and oxygen saturations                     were monitored continuously. The Colonoscope was                     introduced through the anus and advanced to the the cecum,                     identified by appendiceal orifice and ileocecal valve. The                     colonoscopy was performed without difficulty. The patient                     tolerated the procedure well. The quality of the bowel                     preparation was good. Findings:      A few small-mouthed diverticula were found in the sigmoid colon and in       the descending colon.      A less than 1 mm polyp was found in the rectum. The polyp was sessile.       The polyp was removed with a cold biopsy forceps. Resection and       retrieval were complete.      The digital rectal exam was normal.      Non-bleeding internal  hemorrhoids were found during retroflexion. The       hemorrhoids were small.  No additional abnormalities were found on retroflexion. Impression:        - Diverticulosis in the sigmoid colon and in the                     descending colon.                    - One less than 1 mm polyp in the rectum. Resected and                     retrieved.                    - Non-bleeding internal hemorrhoids. Recommendation:    - Await pathology results.                    - Telephone GI clinic for pathology results in 1 week. Procedure Code(s): --- Professional ---                    [phone], Colonoscopy, flexible; with biopsy, single or                     multiple Diagnosis Code(s): --- Professional ---                    V16.0, Family history of malignant neoplasm of                     gastrointestinal tract                    V18.51, Family history of colonic polyps                    569.0, Anal and rectal polyp                    455.0, Internal hemorrhoids without mention of complication                    562.10, Diverticulosis of colon (without mention of                     hemorrhage) CPT copyright 2014 American Medical Association. All rights reserved. The codes documented in this report are preliminary and upon coder review may  be revised to meet current compliance requirements. Lollie Sails, MD 05/29/2015 1:47:01 PM This report has been signed electronically. Number of Addenda: 0 Note Initiated On: 05/29/2015 1:08 PM Scope Withdrawal Time: 0 hours 7 minutes 55 seconds  Total Procedure Duration: 0 hours 11 minutes 12 seconds       [facility]

## 2015-05-30 ENCOUNTER — Encounter: Payer: Self-pay | Admitting: Gastroenterology

## 2015-05-30 LAB — SURGICAL PATHOLOGY

## 2015-06-14 DIAGNOSIS — I10 Essential (primary) hypertension: Secondary | ICD-10-CM | POA: Insufficient documentation

## 2015-11-02 ENCOUNTER — Encounter: Payer: Self-pay | Admitting: Emergency Medicine

## 2015-11-02 ENCOUNTER — Ambulatory Visit
Admission: EM | Admit: 2015-11-02 | Discharge: 2015-11-02 | Disposition: A | Discharge status: Home / Self Care | Payer: BLUE CROSS/BLUE SHIELD | Attending: Family Medicine | Admitting: Family Medicine

## 2015-11-02 DIAGNOSIS — J0101 Acute recurrent maxillary sinusitis: Secondary | ICD-10-CM

## 2015-11-02 DIAGNOSIS — H6983 Other specified disorders of Eustachian tube, bilateral: Secondary | ICD-10-CM | POA: Diagnosis not present

## 2015-11-02 MED ORDER — AMOXICILLIN-POT CLAVULANATE 875-125 MG PO TABS: 1.0000 | ORAL_TABLET | Freq: Two times a day (BID) | ORAL | Status: DC

## 2015-11-02 MED ORDER — IBUPROFEN 800 MG PO TABS
800.0000 mg | ORAL_TABLET | Freq: Once | ORAL | Status: AC
  Administered 2015-11-02: 800 mg via ORAL

## 2015-11-02 NOTE — ED Provider Notes (Signed)
CSN: [id number]     Arrival date & time 11/02/15  1253 History   First MD Initiated Contact with Patient 11/02/15 1327     Chief Complaint  Patient presents with  . URI  . Otalgia   (Consider location/radiation/quality/duration/timing/severity/associated sxs/prior Treatment) HPI 59 yo M presents feeling "miserable'. Has had cough and congestion URI 10 days - past 5 days  increasing with ear pressure R>L, eyes draining green, evening fevers,throat pain, facial pressure. Still smoking ! Has PCP - Dr Ellison Hughs    Past Medical History  Diagnosis Date  . Hypertension   . GERD (gastroesophageal reflux disease)   . Arthritis   . History of hiatal hernia    Past Surgical History  Procedure Laterality Date  . Tonsillectomy    . Colonoscopy N/A 05/29/2015    Procedure: COLONOSCOPY;  Surgeon: Lollie Sails, MD;  Location: [facility] ENDOSCOPY;  Service: Endoscopy;  Laterality: N/A;   No family history on file. Social History  Substance Use Topics  . Smoking status: Current Every Day Smoker    Types: Cigarettes  . Smokeless tobacco: None  . Alcohol Use: No    Review of Systems.10 Systems reviewed and are negative for acute change except as noted in the HPI.  Allergies  Ace inhibitors  Home Medications   Prior to Admission medications   Medication Sig Start Date End Date Taking? Authorizing Provider  acetaminophen (TYLENOL) 650 MG CR tablet Take 650 mg by mouth every 8 (eight) hours as needed for pain.   Yes Historical Provider, MD  amLODipine (NORVASC) 10 MG tablet Take 10 mg by mouth daily.   Yes Historical Provider, MD  aspirin 81 MG tablet Take 81 mg by mouth daily.   Yes Historical Provider, MD  carvedilol (COREG) 3.125 MG tablet Take 3.125 mg by mouth 2 (two) times daily with a meal.   Yes Historical Provider, MD  clonazePAM (KLONOPIN) 0.5 MG tablet Take 0.5 mg by mouth at bedtime.   Yes Historical Provider, MD  escitalopram (LEXAPRO) 20 MG tablet Take 20 mg by mouth daily.    Yes Historical Provider, MD  losartan-hydrochlorothiazide (HYZAAR) 100-25 MG per tablet Take 1 tablet by mouth daily.   Yes Historical Provider, MD  pantoprazole (PROTONIX) 40 MG tablet Take 40 mg by mouth daily.   Yes Historical Provider, MD  pravastatin (PRAVACHOL) 80 MG tablet Take 80 mg by mouth daily.   Yes Historical Provider, MD  tadalafil (CIALIS) 20 MG tablet Take 20 mg by mouth daily as needed for erectile dysfunction.   Yes Historical Provider, MD  amoxicillin-clavulanate (AUGMENTIN) 875-125 MG tablet Take 1 tablet by mouth every 12 (twelve) hours. 11/02/15   Jan Fireman, PA-C   Meds Ordered and Administered this Visit   Medications  ibuprofen (ADVIL,MOTRIN) tablet 800 mg (800 mg Oral Given 11/02/15 1405)  pt. preferred over IM- well tolerated  BP 118/67 mmHg  Pulse 81  Temp(Src) 98.7 F (37.1 C) (Tympanic)  Resp 18  Ht 5\' 7"  (1.702 m)  Wt 196 lb (88.905 kg)  BMI 30.69 kg/m2  SpO2 98% No data found.   Physical Exam   Constitutional -alert and oriented, tired appearing  General: Mild URI distress Head-atraumatic, normocephalic- discomfort with percussion sinuses frontal and maxillary Eyes- conjunctiva normal, EOMI ,conjugate gaze- tear ducts draining purulent discharge, denies tenderness Ears: Right ear feels blocked- hearing reported decreased- fluid behind TM, TM bulge, dull; left canal neg and TM dull Nose- nasal  congestion and rhinorrhea Mouth/throat- mucous membranes moist ,  oropharynx non-erythematous Neck- supple without glandular enlargement CV- regular rate and rhythmn Resp-no distress, normal respiratory effort,clear to auscultation bilaterally GI- soft,non-tender,no distention GU-  not examined MSK- non tender, normal ROM, ambulatory, no gait instability,on /off table solo Neuro- normal speech and language, no gross focal neurological deficit appreciated,  Skin-warm,dry ,intact;  Psych-mood and affect grossly normal; speech and behavior grossly  normal  ED Course  Procedures (including critical care time)  Labs Review Labs Reviewed - No data to display  Imaging Review No results found.   MDM   1. Acute recurrent maxillary sinusitis   2. Eustachian tube dysfunction, bilateral     Discussed therapeutic interventions- Strongly encouraged to stop/cut back smoking esp during acute illness- Techniques reviewed- pleased/encouraged/will try Reports issues with hearing decrease noted at job site- to f/u with PCP after Rx complete May need ENTconsultation /intervention Add Flonase BID x 3 then daily  Add Claritin BID x 3 then daily Add Augmentin Add guaifenesin DM Tylenol/ibuprofen- steam- increase fluids Diagnosis and treatment discussed.  Questions fielded, expectations and recommendations reviewed.Discussed follow up and return parameters including no resolution or any worsening condition.. Patient expresses understanding  and agrees to plan. Will return to [facility] with questions, concerns or exacerbation.    Discharge Medication List as of 11/02/2015  2:02 PM    START taking these medications   Details  amoxicillin-clavulanate (AUGMENTIN) 875-125 MG tablet Take 1 tablet by mouth every 12 (twelve) hours., Starting 11/02/2015, Until Discontinued, Print        Jan Fireman, PA-C 11/05/15 [phone]

## 2015-11-02 NOTE — Discharge Instructions (Signed)
Antibiotic by mouth twice daily x 10 days  Flonase ( fluticasone) 1 spray per nosrtril twice daily for 3 days then daily  Claritin ( loratadine) 10 mg 1 tablet daily   Robitussin DM  ( guaifenesin DM ) 1 tsp every 4 hours   Increased liquids by mouth---increased warm steamy showers- vaporizer  SECOND HALF OF EVERY CIGARETTE IS MINE !!!! Start " Buy Back " with yourself  !!!    Barotitis Media Barotitis media is inflammation of your middle ear. This occurs when the auditory tube (eustachian tube) leading from the back of your nose (nasopharynx) to your eardrum is blocked. This blockage may result from a cold, environmental allergies, or an upper respiratory infection. Unresolved barotitis media may lead to damage or hearing loss (barotrauma), which may become permanent. HOME CARE INSTRUCTIONS   Use medicines as recommended by your health care provider. Over-the-counter medicines will help unblock the canal and can help during times of air travel.  Do not put anything into your ears to clean or unplug them. Eardrops will not be helpful.  Do not swim, dive, or fly until your health care provider says it is all right to do so. If these activities are necessary, chewing gum with frequent, forceful swallowing may help. It is also helpful to hold your nose and gently blow to pop your ears for equalizing pressure changes. This forces air into the eustachian tube.  Only take over-the-counter or prescription medicines for pain, discomfort, or fever as directed by your health care provider.  A decongestant may be helpful in decongesting the middle ear and make pressure equalization easier. SEEK MEDICAL CARE IF:  You experience a serious form of dizziness in which you feel as if the room is spinning and you feel nauseated (vertigo).  Your symptoms only involve one ear. SEEK IMMEDIATE MEDICAL CARE IF:   You develop a severe headache, dizziness, or severe ear pain.  You have bloody or  pus-like drainage from your ears.  You develop a fever.  Your problems do not improve or become worse. MAKE SURE YOU:   Understand these instructions.  Will watch your condition.  Will get help right away if you are not doing well or get worse.   This information is not intended to replace advice given to you by your health care provider. Make sure you discuss any questions you have with your health care provider.   Document Released: 11/21/2000 Document Revised: 09/14/2013 Document Reviewed: 06/21/2013 Elsevier Interactive Patient Education [employer].

## 2015-11-02 NOTE — ED Notes (Signed)
Cough, congested, runny nose, ears stopped up for 5 days

## 2015-11-05 ENCOUNTER — Encounter: Payer: Self-pay | Admitting: Physician Assistant

## 2017-01-27 DIAGNOSIS — G4733 Obstructive sleep apnea (adult) (pediatric): Secondary | ICD-10-CM | POA: Diagnosis not present

## 2017-02-02 ENCOUNTER — Ambulatory Visit
Admission: EM | Admit: 2017-02-02 | Discharge: 2017-02-02 | Disposition: A | Discharge status: Home / Self Care | Payer: BLUE CROSS/BLUE SHIELD | Attending: Emergency Medicine | Admitting: Emergency Medicine

## 2017-02-02 ENCOUNTER — Encounter: Payer: Self-pay | Admitting: *Deleted

## 2017-02-02 ENCOUNTER — Ambulatory Visit (INDEPENDENT_AMBULATORY_CARE_PROVIDER_SITE_OTHER): Payer: BLUE CROSS/BLUE SHIELD

## 2017-02-02 DIAGNOSIS — R05 Cough: Secondary | ICD-10-CM | POA: Diagnosis not present

## 2017-02-02 DIAGNOSIS — R059 Cough, unspecified: Secondary | ICD-10-CM

## 2017-02-02 DIAGNOSIS — J189 Pneumonia, unspecified organism: Secondary | ICD-10-CM

## 2017-02-02 MED ORDER — HYDROCOD POLST-CPM POLST ER 10-8 MG/5ML PO SUER: 5.0000 mL | Freq: Every evening | ORAL | 0 refills | Status: DC | PRN

## 2017-02-02 MED ORDER — IPRATROPIUM-ALBUTEROL 0.5-2.5 (3) MG/3ML IN SOLN
3.0000 mL | Freq: Four times a day (QID) | RESPIRATORY_TRACT | Status: DC
  Administered 2017-02-02: 3 mL via RESPIRATORY_TRACT

## 2017-02-02 MED ORDER — PREDNISONE 10 MG PO TABS: ORAL_TABLET | ORAL | 0 refills | Status: DC

## 2017-02-02 MED ORDER — BENZONATATE 100 MG PO CAPS: 100.0000 mg | ORAL_CAPSULE | Freq: Three times a day (TID) | ORAL | 0 refills | Status: DC | PRN

## 2017-02-02 MED ORDER — AMOXICILLIN-POT CLAVULANATE ER 1000-62.5 MG PO TB12: 2.0000 | ORAL_TABLET | Freq: Two times a day (BID) | ORAL | 0 refills | Status: AC

## 2017-02-02 MED ORDER — DOXYCYCLINE HYCLATE 100 MG PO CAPS: 100.0000 mg | ORAL_CAPSULE | Freq: Two times a day (BID) | ORAL | 0 refills | Status: AC

## 2017-02-02 MED ORDER — ALBUTEROL SULFATE HFA 108 (90 BASE) MCG/ACT IN AERS: 2.0000 | INHALATION_SPRAY | RESPIRATORY_TRACT | 0 refills | Status: AC | PRN

## 2017-02-02 NOTE — ED Triage Notes (Signed)
Productive cough- clear, fever, chills, body aches, x1 week.

## 2017-02-02 NOTE — Discharge Instructions (Signed)
Take medication as prescribed. Take with food. Rest. Drink plenty of fluids.   Follow up with your primary care physician this week as discussed. Return to Urgent care or Emergency room for new or worsening concerns.

## 2017-02-02 NOTE — ED Provider Notes (Signed)
[facility] ____________________________________________  Time seen: Approximately 8:30 AM  I have reviewed the triage vital signs and the nursing notes.   HISTORY  Chief Complaint Cough; Fever; Chills; and Generalized Body Aches  HPI Charles Blake is a 61 y.o. male presenting for the complaints of approximately 2 weeks of runny nose, nasal congestion and cough. Patient reports that initially his wife had something very similar to influenza, and reports he then contacted shortly after her. Patient reports he has had intermittent fevers. Reports last fever was 100 yesterday. Reports some nasal congestion continues, but she needs with cough and chest congestion. Reports cough is a hacking cough, occasionally productive. Denies hemoptysis. Reports occasionally wheezing. Reports does have a history of COPD and intermittently has wheezing at baseline, as well as a smoker. Denies chest pain or shortness of breath. Reports has continued to remain active. Reports continues to eat and drink well. Denies pain at this time. Reports symptoms unresolved with over-the-counter cough and congestion medications.  Denies chest pain, shortness of breath, abdominal pain, dysuria, extremity pain, extremity swelling or rash. Denies recent sickness. Denies recent antibiotic use. Denies renal insufficiency.  [facility], DALE E, MD: PCP    Past Medical History:  Diagnosis Date  . Arthritis   . GERD (gastroesophageal reflux disease)   . History of hiatal hernia   . Hypertension     There are no active problems to display for this patient.   Past Surgical History:  Procedure Laterality Date  . COLONOSCOPY N/A 05/29/2015   Procedure: COLONOSCOPY;  Surgeon: Lollie Sails, MD;  Location: [facility] ENDOSCOPY;  Service: Endoscopy;  Laterality: N/A;  . TONSILLECTOMY        Current Facility-Administered Medications:  .  ipratropium-albuterol (DUONEB) 0.5-2.5 (3) MG/3ML nebulizer solution 3  mL, 3 mL, Nebulization, Q6H, Marylene Land, NP, 3 mL at 02/02/17 0847  Current Outpatient Prescriptions:  .  acetaminophen (TYLENOL) 650 MG CR tablet, Take 650 mg by mouth every 8 (eight) hours as needed for pain., Disp: , Rfl:  .  amLODipine (NORVASC) 10 MG tablet, Take 10 mg by mouth daily., Disp: , Rfl:  .  aspirin 81 MG tablet, Take 81 mg by mouth daily., Disp: , Rfl:  .  carvedilol (COREG) 3.125 MG tablet, Take 3.125 mg by mouth 2 (two) times daily with a meal., Disp: , Rfl:  .  clonazePAM (KLONOPIN) 0.5 MG tablet, Take 0.5 mg by mouth at bedtime., Disp: , Rfl:  .  escitalopram (LEXAPRO) 20 MG tablet, Take 20 mg by mouth daily., Disp: , Rfl:  .  losartan-hydrochlorothiazide (HYZAAR) 100-25 MG per tablet, Take 1 tablet by mouth daily., Disp: , Rfl:  .  pantoprazole (PROTONIX) 40 MG tablet, Take 40 mg by mouth daily., Disp: , Rfl:  .  pravastatin (PRAVACHOL) 80 MG tablet, Take 80 mg by mouth daily., Disp: , Rfl:  .  albuterol (PROVENTIL HFA;VENTOLIN HFA) 108 (90 Base) MCG/ACT inhaler, Inhale 2 puffs into the lungs every 4 (four) hours as needed for wheezing., Disp: 1 Inhaler, Rfl: 0 .  amoxicillin-clavulanate (AUGMENTIN XR) 1000-62.5 MG 12 hr tablet, Take 2 tablets by mouth 2 (two) times daily., Disp: 28 tablet, Rfl: 0 .  benzonatate (TESSALON PERLES) 100 MG capsule, Take 1 capsule (100 mg total) by mouth 3 (three) times daily as needed for cough., Disp: 15 capsule, Rfl: 0 .  chlorpheniramine-HYDROcodone (TUSSIONEX PENNKINETIC ER) 10-8 MG/5ML SUER, Take 5 mLs by mouth at bedtime as needed for cough. do not drive or operate  machinery while taking as can cause drowsiness., Disp: 75 mL, Rfl: 0 .  doxycycline (VIBRAMYCIN) 100 MG capsule, Take 1 capsule (100 mg total) by mouth 2 (two) times daily., Disp: 14 capsule, Rfl: 0 .  predniSONE (DELTASONE) 10 MG tablet, Start 60 mg po day one, then 50 mg po day two, taper by 10 mg daily until complete., Disp: 21 tablet, Rfl: 0 .  tadalafil (CIALIS) 20 MG  tablet, Take 20 mg by mouth daily as needed for erectile dysfunction., Disp: , Rfl:   Allergies Patient has no known allergies.  History reviewed. No pertinent family history.  Social History Social History  Substance Use Topics  . Smoking status: Current Every Day Smoker    Types: Cigarettes  . Smokeless tobacco: Never Used  . Alcohol use No    Review of Systems Constitutional: As above.  Eyes: No visual changes. ENT: States occasional scratchy throat, denies sore throat.  Cardiovascular: Denies chest pain. Respiratory: Denies shortness of breath. Gastrointestinal: No abdominal pain.  No nausea, no vomiting.  No diarrhea.  No constipation. Genitourinary: Negative for dysuria. Musculoskeletal: Negative for back pain. Skin: Negative for rash. Neurological: Negative for headaches, focal weakness or numbness.  10-point ROS otherwise negative.  ____________________________________________   PHYSICAL EXAM:  VITAL SIGNS: ED Triage Vitals  Enc Vitals Group     BP 02/02/17 0816 135/66     Pulse Rate 02/02/17 0816 73     Resp 02/02/17 0816 16     Temp 02/02/17 0816 99.3 F (37.4 C)     Temp Source 02/02/17 0816 Oral     SpO2 02/02/17 0816 96 %     Weight 02/02/17 0818 200 lb (90.7 kg)     Height 02/02/17 0818 5\' 7"  (1.702 m)     Head Circumference --      Peak Flow --      Pain Score --      Pain Loc --      Pain Edu? --      Excl. in [place]? --     Constitutional: Alert and oriented. Well appearing and in no acute distress. Eyes: Conjunctivae are normal. PERRL. EOMI. Head: Atraumatic. No sinus tenderness to palpation. No swelling. No erythema.  Ears: no erythema, normal TMs bilaterally.   Nose:Nasal congestion with clear rhinorrhea  Mouth/Throat: Mucous membranes are moist. No pharyngeal erythema. No tonsillar swelling or exudate.  Neck: No stridor.  No cervical spine tenderness to palpation. Hematological/Lymphatic/Immunilogical: No cervical  lymphadenopathy. Cardiovascular: Normal rate, regular rhythm. Grossly normal heart sounds.  Good peripheral circulation. Respiratory: Normal respiratory effort.  No retractions. Good air movement. Scattered inspiratory wheezes. Mild scattered rhonchi. Dry intermittent cough noted in room.  Gastrointestinal: Soft and nontender. No CVA tenderness. Musculoskeletal: Ambulatory with steady gait. No cervical, thoracic or lumbar tenderness to palpation. Bilateral lower extremities nontender and no edema noted. Neurologic:  Normal speech and language. No gait instability. Skin:  Skin appears warm, dry and intact. No rash noted. Psychiatric: Mood and affect are normal. Speech and behavior are normal. ___________________________________________   LABS (all labs ordered are listed, but only abnormal results are displayed)  Labs Reviewed - No data to display ____________________________________________  RADIOLOGY  Dg Chest 2 View  Result Date: 02/02/2017 CLINICAL DATA:  Productive cough and wheezing with fever for the past 2 weeks. Current smoker. History of bronchitis -Celsius OPD. EXAM: CHEST  2 VIEW COMPARISON:  Chest x-ray of February 22, 2014 FINDINGS: The lungs are mildly hyperinflated. There is no focal  infiltrate but there is subtle increased density that projects over the posterior aspect of the mid thoracic spine on the lateral view. There is no pleural effusion or pneumothorax. The heart and pulmonary vascularity are normal. There is calcification in the wall of the aortic arch. There is mild multilevel degenerative disc disease of the thoracic spine. IMPRESSION: COPD. No definite acute pneumonia. Subtle nonspecific increased density projects over the posterior aspect of the T5-6 disc level which may reflect osteophyte but is more conspicuous than in the past. Followup PA and lateral chest X-ray is recommended in 3-4 weeks following trial of antibiotic therapy to ensure resolution and exclude  underlying malignancy. Electronically Signed   By: David  Martinique M.D.   On: 02/02/2017 09:12   ____________________________________________   PROCEDURES Procedures   INITIAL IMPRESSION / ASSESSMENT AND PLAN / ED COURSE  Pertinent labs & imaging results that were available during my care of the patient were reviewed by me and considered in my medical decision making (see chart for details).  Well-appearing patient. No acute distress. DuoNeb administered once. Will evaluate chest x-ray.  After duoneb, patient reevaluated, reports feeling better, wheezes fully resolved. Chest x-ray per radiologist reviewed, per radiologist, no definite acute pneumonia, subtle nonspecific increased density projects over the posterior aspect may reflect osteophyte but more conspicuous in the past, recommend follow-up post antibiotic to exclude underlying malignancy. Discussed in detail with patient regarding these results. As patient also with history CAD as well as complete left bundle branch block, and concern for acute pneumonia, will place patient on combination therapy of high-dose oral Augmentin and doxycycline, prednisone taper, when necessary albuterol inhaler. When necessary Tessalon and Tussionex as needed for cough. Discussed with patient strict follow-up and return parameters. Patient will follow up with PCP at the end of this week. Patient also has preplanned cardiology follow-up tomorrow.Discussed indication, risks and benefits of medications with patient. As patient take with food as well as discussed use of probiotics.  Discussed follow up with Primary care physician this week. Discussed follow up and return parameters including no resolution or any worsening concerns. Patient verbalized understanding and agreed to plan.   ____________________________________________   FINAL CLINICAL IMPRESSION(S) / ED DIAGNOSES  Final diagnoses:  Cough  Pneumonia, unspecified organism     New Prescriptions    ALBUTEROL (PROVENTIL HFA;VENTOLIN HFA) 108 (90 BASE) MCG/ACT INHALER    Inhale 2 puffs into the lungs every 4 (four) hours as needed for wheezing.   AMOXICILLIN-CLAVULANATE (AUGMENTIN XR) 1000-62.5 MG 12 HR TABLET    Take 2 tablets by mouth 2 (two) times daily.   BENZONATATE (TESSALON PERLES) 100 MG CAPSULE    Take 1 capsule (100 mg total) by mouth 3 (three) times daily as needed for cough.   CHLORPHENIRAMINE-HYDROCODONE (TUSSIONEX PENNKINETIC ER) 10-8 MG/5ML SUER    Take 5 mLs by mouth at bedtime as needed for cough. do not drive or operate machinery while taking as can cause drowsiness.   DOXYCYCLINE (VIBRAMYCIN) 100 MG CAPSULE    Take 1 capsule (100 mg total) by mouth 2 (two) times daily.   PREDNISONE (DELTASONE) 10 MG TABLET    Start 60 mg po day one, then 50 mg po day two, taper by 10 mg daily until complete.    Note: This dictation was prepared with Dragon dictation along with smaller phrase technology. Any transcriptional errors that result from this process are unintentional.         Marylene Land, NP 02/02/17 1114

## 2017-02-03 DIAGNOSIS — I447 Left bundle-branch block, unspecified: Secondary | ICD-10-CM | POA: Diagnosis not present

## 2017-02-03 DIAGNOSIS — I739 Peripheral vascular disease, unspecified: Secondary | ICD-10-CM | POA: Diagnosis not present

## 2017-02-03 DIAGNOSIS — R0602 Shortness of breath: Secondary | ICD-10-CM | POA: Diagnosis not present

## 2017-02-03 DIAGNOSIS — I25118 Atherosclerotic heart disease of native coronary artery with other forms of angina pectoris: Secondary | ICD-10-CM | POA: Diagnosis not present

## 2017-02-06 DIAGNOSIS — J189 Pneumonia, unspecified organism: Secondary | ICD-10-CM | POA: Diagnosis not present

## 2017-02-19 DIAGNOSIS — G4733 Obstructive sleep apnea (adult) (pediatric): Secondary | ICD-10-CM | POA: Diagnosis not present

## 2017-02-24 DIAGNOSIS — G4733 Obstructive sleep apnea (adult) (pediatric): Secondary | ICD-10-CM | POA: Diagnosis not present

## 2017-03-02 DIAGNOSIS — I25118 Atherosclerotic heart disease of native coronary artery with other forms of angina pectoris: Secondary | ICD-10-CM | POA: Diagnosis not present

## 2017-03-02 DIAGNOSIS — R0602 Shortness of breath: Secondary | ICD-10-CM | POA: Diagnosis not present

## 2017-03-03 DIAGNOSIS — I2510 Atherosclerotic heart disease of native coronary artery without angina pectoris: Secondary | ICD-10-CM | POA: Diagnosis not present

## 2017-03-03 DIAGNOSIS — I420 Dilated cardiomyopathy: Secondary | ICD-10-CM | POA: Diagnosis not present

## 2017-03-03 DIAGNOSIS — I10 Essential (primary) hypertension: Secondary | ICD-10-CM | POA: Diagnosis not present

## 2017-03-03 DIAGNOSIS — R0602 Shortness of breath: Secondary | ICD-10-CM | POA: Diagnosis not present

## 2017-03-12 DIAGNOSIS — K219 Gastro-esophageal reflux disease without esophagitis: Secondary | ICD-10-CM | POA: Diagnosis not present

## 2017-03-12 DIAGNOSIS — E782 Mixed hyperlipidemia: Secondary | ICD-10-CM | POA: Diagnosis not present

## 2017-03-12 DIAGNOSIS — F419 Anxiety disorder, unspecified: Secondary | ICD-10-CM | POA: Diagnosis not present

## 2017-03-12 DIAGNOSIS — I10 Essential (primary) hypertension: Secondary | ICD-10-CM | POA: Diagnosis not present

## 2017-03-16 DIAGNOSIS — E782 Mixed hyperlipidemia: Secondary | ICD-10-CM | POA: Diagnosis not present

## 2017-03-17 DIAGNOSIS — C44521 Squamous cell carcinoma of skin of breast: Secondary | ICD-10-CM | POA: Diagnosis not present

## 2017-03-17 DIAGNOSIS — Z85820 Personal history of malignant melanoma of skin: Secondary | ICD-10-CM | POA: Diagnosis not present

## 2017-03-17 DIAGNOSIS — D485 Neoplasm of uncertain behavior of skin: Secondary | ICD-10-CM | POA: Diagnosis not present

## 2017-03-17 DIAGNOSIS — D0461 Carcinoma in situ of skin of right upper limb, including shoulder: Secondary | ICD-10-CM | POA: Diagnosis not present

## 2017-03-17 DIAGNOSIS — Z872 Personal history of diseases of the skin and subcutaneous tissue: Secondary | ICD-10-CM | POA: Diagnosis not present

## 2017-03-17 DIAGNOSIS — Z859 Personal history of malignant neoplasm, unspecified: Secondary | ICD-10-CM | POA: Diagnosis not present

## 2017-03-17 DIAGNOSIS — L570 Actinic keratosis: Secondary | ICD-10-CM | POA: Diagnosis not present

## 2017-03-22 DIAGNOSIS — G4733 Obstructive sleep apnea (adult) (pediatric): Secondary | ICD-10-CM | POA: Diagnosis not present

## 2017-03-26 DIAGNOSIS — R1011 Right upper quadrant pain: Secondary | ICD-10-CM | POA: Diagnosis not present

## 2017-03-27 DIAGNOSIS — G4733 Obstructive sleep apnea (adult) (pediatric): Secondary | ICD-10-CM | POA: Diagnosis not present

## 2017-03-30 ENCOUNTER — Emergency Department: Payer: BLUE CROSS/BLUE SHIELD

## 2017-03-30 ENCOUNTER — Encounter: Payer: Self-pay | Admitting: *Deleted

## 2017-03-30 ENCOUNTER — Ambulatory Visit
Admission: EM | Admit: 2017-03-30 | Discharge: 2017-03-30 | Disposition: A | Discharge status: Home / Self Care | Payer: BLUE CROSS/BLUE SHIELD | Attending: Family Medicine | Admitting: Family Medicine

## 2017-03-30 ENCOUNTER — Emergency Department
Admission: EM | Admit: 2017-03-30 | Discharge: 2017-03-30 | Disposition: A | Discharge status: Home / Self Care | Payer: BLUE CROSS/BLUE SHIELD | Attending: Emergency Medicine | Admitting: Emergency Medicine

## 2017-03-30 DIAGNOSIS — Z7982 Long term (current) use of aspirin: Secondary | ICD-10-CM | POA: Insufficient documentation

## 2017-03-30 DIAGNOSIS — A0472 Enterocolitis due to Clostridium difficile, not specified as recurrent: Secondary | ICD-10-CM | POA: Diagnosis not present

## 2017-03-30 DIAGNOSIS — R109 Unspecified abdominal pain: Secondary | ICD-10-CM | POA: Insufficient documentation

## 2017-03-30 DIAGNOSIS — Z79899 Other long term (current) drug therapy: Secondary | ICD-10-CM | POA: Insufficient documentation

## 2017-03-30 DIAGNOSIS — R197 Diarrhea, unspecified: Secondary | ICD-10-CM

## 2017-03-30 DIAGNOSIS — I10 Essential (primary) hypertension: Secondary | ICD-10-CM | POA: Insufficient documentation

## 2017-03-30 DIAGNOSIS — R110 Nausea: Secondary | ICD-10-CM

## 2017-03-30 DIAGNOSIS — F17210 Nicotine dependence, cigarettes, uncomplicated: Secondary | ICD-10-CM | POA: Diagnosis not present

## 2017-03-30 DIAGNOSIS — K529 Noninfective gastroenteritis and colitis, unspecified: Secondary | ICD-10-CM

## 2017-03-30 HISTORY — DX: Malignant (primary) neoplasm, unspecified: C80.1

## 2017-03-30 LAB — COMPREHENSIVE METABOLIC PANEL
ALT: 28 U/L (ref 17–63)
AST: 26 U/L (ref 15–41)
Albumin: 4 g/dL (ref 3.5–5.0)
Alkaline Phosphatase: 62 U/L (ref 38–126)
Anion gap: 9 (ref 5–15)
BUN: 12 mg/dL (ref 6–20)
CO2: 27 mmol/L (ref 22–32)
Calcium: 8.6 mg/dL — ABNORMAL LOW (ref 8.9–10.3)
Chloride: 97 mmol/L — ABNORMAL LOW (ref 101–111)
Creatinine, Ser: 0.87 mg/dL (ref 0.61–1.24)
GFR calc Af Amer: >60 mL/min (ref >60)
GFR calc non Af Amer: >60 mL/min (ref >60)
Glucose, Bld: 126 mg/dL — ABNORMAL HIGH (ref 65–99)
Potassium: 3.2 mmol/L — ABNORMAL LOW (ref 3.5–5.1)
Sodium: 133 mmol/L — ABNORMAL LOW (ref 135–145)
Total Bilirubin: 1.2 mg/dL (ref 0.3–1.2)
Total Protein: 7.9 g/dL (ref 6.5–8.1)

## 2017-03-30 LAB — GASTROINTESTINAL PANEL BY PCR, STOOL (REPLACES STOOL CULTURE)

## 2017-03-30 LAB — CBC WITH DIFFERENTIAL/PLATELET
Basophils Absolute: 0.2 10*3/uL — ABNORMAL HIGH (ref 0–0.1)
Basophils Relative: 1 %
Eosinophils Absolute: 0.1 10*3/uL (ref 0–0.7)
Eosinophils Relative: 1 %
HCT: 46.6 % (ref 40.0–52.0)
Hemoglobin: 16.3 g/dL (ref 13.0–18.0)
Lymphocytes Relative: 14 %
Lymphs Abs: 2.9 10*3/uL (ref 1.0–3.6)
MCH: 31.9 pg (ref 26.0–34.0)
MCHC: 34.9 g/dL (ref 32.0–36.0)
MCV: 91.3 fL (ref 80.0–100.0)
Monocytes Absolute: 1.6 10*3/uL — ABNORMAL HIGH (ref 0.2–1.0)
Monocytes Relative: 8 %
Neutro Abs: 15.4 10*3/uL — ABNORMAL HIGH (ref 1.4–6.5)
Neutrophils Relative %: 76 %
Platelets: 312 10*3/uL (ref 150–440)
RBC: 5.11 MIL/uL (ref 4.40–5.90)
RDW: 14.1 % (ref 11.5–14.5)
WBC: 20.2 10*3/uL — ABNORMAL HIGH (ref 3.8–10.6)

## 2017-03-30 MED ORDER — METRONIDAZOLE 500 MG PO TABS: 500.0000 mg | ORAL_TABLET | Freq: Three times a day (TID) | ORAL | 0 refills | Status: AC

## 2017-03-30 MED ORDER — ONDANSETRON HCL 4 MG/2ML IJ SOLN
4.0000 mg | Freq: Once | INTRAMUSCULAR | Status: AC
  Administered 2017-03-30: 4 mg via INTRAVENOUS
  Filled 2017-03-30: qty 2

## 2017-03-30 MED ORDER — IOPAMIDOL (ISOVUE-300) INJECTION 61%
100.0000 mL | Freq: Once | INTRAVENOUS | Status: AC | PRN
  Administered 2017-03-30: 100 mL via INTRAVENOUS
  Filled 2017-03-30: qty 100

## 2017-03-30 MED ORDER — SODIUM CHLORIDE 0.9 % IV BOLUS (SEPSIS)
1000.0000 mL | Freq: Once | INTRAVENOUS | Status: AC
  Administered 2017-03-30: 1000 mL via INTRAVENOUS

## 2017-03-30 MED ORDER — HYDROCODONE-ACETAMINOPHEN 5-325 MG PO TABS: 1.0000 | ORAL_TABLET | ORAL | 0 refills | Status: DC | PRN

## 2017-03-30 MED ORDER — IOPAMIDOL (ISOVUE-300) INJECTION 61%
30.0000 mL | Freq: Once | INTRAVENOUS | Status: AC | PRN
  Administered 2017-03-30: 30 mL via ORAL
  Filled 2017-03-30: qty 30

## 2017-03-30 MED ORDER — MORPHINE SULFATE (PF) 4 MG/ML IV SOLN
4.0000 mg | Freq: Once | INTRAVENOUS | Status: AC
  Administered 2017-03-30: 4 mg via INTRAVENOUS
  Filled 2017-03-30: qty 1

## 2017-03-30 MED ORDER — METRONIDAZOLE 500 MG PO TABS
500.0000 mg | ORAL_TABLET | Freq: Once | ORAL | Status: AC
  Administered 2017-03-30: 500 mg via ORAL

## 2017-03-30 NOTE — ED Triage Notes (Signed)
Diffuse abd pain, and N/V/D x1 week. Seen at [facility] last week for same and was advised if no better by today to come here.

## 2017-03-30 NOTE — Discharge Instructions (Signed)
Please take your antibiotics for their entire course. Please take pain medication as needed, as prescribed. Please follow-up with your primary care doctor in the next 1-2 weeks for recheck/reevaluation. Return to the emergency department for any worsening abdominal pain, fever, or if concerned about dehydration.

## 2017-03-30 NOTE — ED Notes (Signed)
Pt had blood work drawn at [facility] this AM.

## 2017-03-30 NOTE — ED Provider Notes (Signed)
CSN: [id number]     Arrival date & time 03/30/17  0805 History   First MD Initiated Contact with Patient 03/30/17 0845     Chief Complaint  Patient presents with  . Abdominal Pain  . Nausea  . Emesis   (Consider location/radiation/quality/duration/timing/severity/associated sxs/prior Treatment) HPI  This is a 61 year old male who presents with abdominal pain indicating a periumbilical which is worse with defecation but no nausea or vomiting for 1 week. He has had diarrhea multiple times per day which is described as watery without blood or mucus. He already had 3 bowel movements today. He was seen at [facility] clinic last week , not placed on any medications. He was taking a different medication for his psoriasis prescribed by his dermatologist on a weekly basis but discontinued that of to see if that was causing his diarrhea. He also states that the diarrhea started prior to initiation of that medication. In reviewing his medical records he had a colonoscopy in 2016 and remembers the gastroenterologist telling him that he had the onset of early Crohn's disease but he wasn't quite sure that that's exactly what he said. Procedure note did not specifically mentioned Crohn's disease.       Past Medical History:  Diagnosis Date  . Arthritis   . GERD (gastroesophageal reflux disease)   . History of hiatal hernia   . Hypertension    Past Surgical History:  Procedure Laterality Date  . COLONOSCOPY N/A 05/29/2015   Procedure: COLONOSCOPY;  Surgeon: Lollie Sails, MD;  Location: [facility] ENDOSCOPY;  Service: Endoscopy;  Laterality: N/A;  . TONSILLECTOMY     History reviewed. No pertinent family history. Social History  Substance Use Topics  . Smoking status: Current Every Day Smoker    Types: Cigarettes  . Smokeless tobacco: Never Used  . Alcohol use No    Review of Systems  Constitutional: Positive for activity change and chills. Negative for appetite change, fatigue and fever.   Gastrointestinal: Positive for abdominal distention, abdominal pain and diarrhea. Negative for nausea and vomiting.  All other systems reviewed and are negative.   Allergies  Patient has no known allergies.  Home Medications   Prior to Admission medications   Medication Sig Start Date End Date Taking? Authorizing Provider  amLODipine (NORVASC) 10 MG tablet Take 10 mg by mouth daily.   Yes Historical Provider, MD  aspirin 81 MG tablet Take 81 mg by mouth daily.   Yes Historical Provider, MD  carvedilol (COREG) 3.125 MG tablet Take 3.125 mg by mouth 2 (two) times daily with a meal.   Yes Historical Provider, MD  clonazePAM (KLONOPIN) 0.5 MG tablet Take 0.5 mg by mouth at bedtime.   Yes Historical Provider, MD  escitalopram (LEXAPRO) 20 MG tablet Take 20 mg by mouth daily.   Yes Historical Provider, MD  losartan-hydrochlorothiazide (HYZAAR) 100-25 MG per tablet Take 1 tablet by mouth daily.   Yes Historical Provider, MD  pantoprazole (PROTONIX) 40 MG tablet Take 40 mg by mouth daily.   Yes Historical Provider, MD  pravastatin (PRAVACHOL) 80 MG tablet Take 80 mg by mouth daily.   Yes Historical Provider, MD  acetaminophen (TYLENOL) 650 MG CR tablet Take 650 mg by mouth every 8 (eight) hours as needed for pain.    Historical Provider, MD  albuterol (PROVENTIL HFA;VENTOLIN HFA) 108 (90 Base) MCG/ACT inhaler Inhale 2 puffs into the lungs every 4 (four) hours as needed for wheezing. 02/02/17   Marylene Land, NP  benzonatate (TESSALON PERLES) 100  MG capsule Take 1 capsule (100 mg total) by mouth 3 (three) times daily as needed for cough. 02/02/17   Marylene Land, NP  tadalafil (CIALIS) 20 MG tablet Take 20 mg by mouth daily as needed for erectile dysfunction.    Historical Provider, MD   Meds Ordered and Administered this Visit  Medications - No data to display  BP (!) 158/79 (BP Location: Left Arm)   Pulse 88   Temp 99 F (37.2 C) (Oral)   Resp 16   Ht 5\' 1"  (1.549 m)   Wt 210 lb (95.3 kg)    SpO2 96%   BMI 39.68 kg/m  No data found.   Physical Exam  Constitutional: He is oriented to person, place, and time. He appears well-developed and well-nourished. No distress.  HENT:  Head: Normocephalic and atraumatic.  Eyes: Pupils are equal, round, and reactive to light.  Neck: Normal range of motion.  Pulmonary/Chest: Effort normal and breath sounds normal.  Abdominal: Soft. Bowel sounds are normal. He exhibits distension. He exhibits no mass. There is tenderness. There is no rebound and no guarding.  Patient has tenderness in the right upper quadrant as well as the mid quadrant just lateral to the umbilicus. Is no rebound and only slight guarding. The belly does appear to be distended.  Musculoskeletal: Normal range of motion.  Neurological: He is alert and oriented to person, place, and time.  Skin: Skin is warm and dry. He is not diaphoretic.  Psychiatric: He has a normal mood and affect. His behavior is normal. Judgment and thought content normal.  Nursing note and vitals reviewed.   Urgent Care Course     Procedures (including critical care time)  Labs Review Labs Reviewed  CBC WITH DIFFERENTIAL/PLATELET - Abnormal; Notable for the following:       Result Value   WBC 20.2 (*)    Neutro Abs 15.4 (*)    Monocytes Absolute 1.6 (*)    Basophils Absolute 0.2 (*)    All other components within normal limits  COMPREHENSIVE METABOLIC PANEL - Abnormal; Notable for the following:    Sodium 133 (*)    Potassium 3.2 (*)    Chloride 97 (*)    Glucose, Bld 126 (*)    Calcium 8.6 (*)    All other components within normal limits  GASTROINTESTINAL PANEL BY PCR, STOOL (REPLACES STOOL CULTURE)    Imaging Review No results found.   Visual Acuity Review  Right Eye Distance:   Left Eye Distance:   Bilateral Distance:    Right Eye Near:   Left Eye Near:    Bilateral Near:         MDM   1. Diarrhea of presumed infectious origin    I've had a discussion with  the patient regarding the laboratory and the findings on physical exam. His white count is elevated and his continued pain is bothersome. Need more of a workup then can be provided here. He will likely need imaging studies with contrast. He is agreeable and wishes to go to [facility]. I talked with April the triage nurse to notify her of  His arrival and  condition. He left facility in stable condition and went by privately owned vehicle.    Lorin Picket, PA-C 03/30/17 1019

## 2017-03-30 NOTE — ED Notes (Signed)
Return from CT scan. AAOx3.  Skin warm and dry.  Ambulates with easy and steady gait.  NAD

## 2017-03-30 NOTE — ED Notes (Signed)
AAOx3.  Skin warm and dry.  NAD 

## 2017-03-30 NOTE — ED Triage Notes (Signed)
Pt states abd cramping for a week with diarrhea. Denies vomiting. Seen at [facility] and told if got worse to come back. Saw UC this am and sent over here. Was told WBC is low. No distress noted, ambulatory. Alert, oriented.

## 2017-03-30 NOTE — ED Provider Notes (Signed)
[facility] Emergency Department Provider Note  Time seen: 2:37 PM  I have reviewed the triage vital signs and the nursing notes.   HISTORY  Chief Complaint Abdominal Cramping and Diarrhea    HPI Charles Blake is a 61 y.o. male with a past medical history of arthritis, gastric reflux, hypertension, presents to the emergency department for abdominal pain. According to the patient for the past one week he has had fairly diffuse abdominal pain left greater than right. Patient is also been experiencing diarrhea, up to 10-20 bowel movements per day but low volume. Denies black or bloody stool. Denies hematuria states mild burning occasionally with urination. Describes his pain as moderate diffuse, 7/10 dull aching pain. Patient went to urgent care last week they recommended a bland diet, he returned today was found that the white blood cell count of 20,000 and referred to the emergency department for further evaluation.  Past Medical History:  Diagnosis Date  . Arthritis   . GERD (gastroesophageal reflux disease)   . History of hiatal hernia   . Hypertension     There are no active problems to display for this patient.   Past Surgical History:  Procedure Laterality Date  . COLONOSCOPY N/A 05/29/2015   Procedure: COLONOSCOPY;  Surgeon: Lollie Sails, MD;  Location: [facility] ENDOSCOPY;  Service: Endoscopy;  Laterality: N/A;  . TONSILLECTOMY      Prior to Admission medications   Medication Sig Start Date End Date Taking? Authorizing Provider  acetaminophen (TYLENOL) 650 MG CR tablet Take 650 mg by mouth every 8 (eight) hours as needed for pain.    Historical Provider, MD  albuterol (PROVENTIL HFA;VENTOLIN HFA) 108 (90 Base) MCG/ACT inhaler Inhale 2 puffs into the lungs every 4 (four) hours as needed for wheezing. 02/02/17   Marylene Land, NP  amLODipine (NORVASC) 10 MG tablet Take 10 mg by mouth daily.    Historical Provider, MD  aspirin 81 MG tablet Take 81  mg by mouth daily.    Historical Provider, MD  benzonatate (TESSALON PERLES) 100 MG capsule Take 1 capsule (100 mg total) by mouth 3 (three) times daily as needed for cough. 02/02/17   Marylene Land, NP  carvedilol (COREG) 3.125 MG tablet Take 3.125 mg by mouth 2 (two) times daily with a meal.    Historical Provider, MD  clonazePAM (KLONOPIN) 0.5 MG tablet Take 0.5 mg by mouth at bedtime.    Historical Provider, MD  escitalopram (LEXAPRO) 20 MG tablet Take 20 mg by mouth daily.    Historical Provider, MD  losartan-hydrochlorothiazide (HYZAAR) 100-25 MG per tablet Take 1 tablet by mouth daily.    Historical Provider, MD  pantoprazole (PROTONIX) 40 MG tablet Take 40 mg by mouth daily.    Historical Provider, MD  pravastatin (PRAVACHOL) 80 MG tablet Take 80 mg by mouth daily.    Historical Provider, MD  tadalafil (CIALIS) 20 MG tablet Take 20 mg by mouth daily as needed for erectile dysfunction.    Historical Provider, MD    No Known Allergies  History reviewed. No pertinent family history.  Social History Social History  Substance Use Topics  . Smoking status: Current Every Day Smoker    Types: Cigarettes  . Smokeless tobacco: Never Used  . Alcohol use No    Review of Systems Constitutional: Negative for fever. Eyes: Negative for visual changes. ENT: Negative for congestion Cardiovascular: Negative for chest pain. Respiratory: Negative for shortness of breath. Gastrointestinal: Fairly diffuse abdominal discomfort. Positive for  diarrhea. Genitourinary: Negative for dysuria. Musculoskeletal: Negative for back pain. Skin: Negative for rash. Neurological: Negative for headache 10-point ROS otherwise negative.  ____________________________________________   PHYSICAL EXAM:  VITAL SIGNS: ED Triage Vitals  Enc Vitals Group     BP 03/30/17 1112 140/79     Pulse Rate 03/30/17 1112 93     Resp 03/30/17 1112 18     Temp 03/30/17 1112 98.7 F (37.1 C)     Temp Source 03/30/17 1112  Oral     SpO2 03/30/17 1112 97 %     Weight 03/30/17 1113 210 lb (95.3 kg)     Height 03/30/17 1113 5\' 7"  (1.702 m)     Head Circumference --      Peak Flow --      Pain Score 03/30/17 1111 8     Pain Loc --      Pain Edu? --      Excl. in [place]? --     Constitutional: Alert and oriented. Well appearing and in no distress. Eyes: Normal exam ENT   Head: Normocephalic and atraumatic.   Mouth/Throat: Mucous membranes are moist. Cardiovascular: Normal rate, regular rhythm. No murmur Respiratory: Normal respiratory effort without tachypnea nor retractions. Breath sounds are clear Gastrointestinal: Soft, moderate diffuse abdominal tenderness somewhat worse than the left upper and left lower quadrant. No rebound or guarding. No appreciable distention. Musculoskeletal: Nontender with normal range of motion in all extremities. Neurologic:  Normal speech and language. No gross focal neurologic deficits  Skin:  Skin is warm, dry and intact.  Psychiatric: Mood and affect are normal.  ____________________________________________   RADIOLOGY  IMPRESSION: Small sliding-type hiatal hernia.  Aortic atherosclerosis is noted. 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  Moderate size fat containing left inguinal hernia.  Diffuse wall thickening of the colon and rectum is noted suggesting infectious or inflammatory colitis.  ____________________________________________   INITIAL IMPRESSION / ASSESSMENT AND PLAN / ED COURSE  Pertinent labs & imaging results that were available during my care of the patient were reviewed by me and considered in my medical decision making (see chart for details).  The patient presents to the emergency department for abdominal discomfort and diarrhea ongoing for 1 week. Patient's labs from urgent care shows significant leukocytosis of 20,000. On exam patient has moderate left-sided abdominal tenderness with mild fairly  diffuse tenderness. Given the patient's leukocytosis with abdominal pain and diarrhea we'll obtain a stool culture and perform a CT scan of the abdomen to further evaluate. Differential this time would include colitis as well as diverticulitis. Urinalysis is pending.  CT is positive for diffuse rectal colitis. C. difficile is positive. I discussed results with the patient. He states he feels like he is well enough to go home. He feels occasional keep up with his fluids at home. We will place the patient on Flagyl for the next 2 weeks. Patient will follow up with his primary care doctor for further evaluation. I discussed return precautions for abdominal pain, fever or signs of dehydration.  ____________________________________________   FINAL CLINICAL IMPRESSION(S) / ED DIAGNOSES  Abdominal pain Diarrhea Clostridium difficile colitis   Harvest Dark, MD 03/30/17 1643

## 2017-03-30 NOTE — ED Notes (Signed)
Patient denies pain and is resting comfortably.  

## 2017-04-26 DIAGNOSIS — G4733 Obstructive sleep apnea (adult) (pediatric): Secondary | ICD-10-CM | POA: Diagnosis not present

## 2017-04-29 DIAGNOSIS — L570 Actinic keratosis: Secondary | ICD-10-CM | POA: Diagnosis not present

## 2017-04-29 DIAGNOSIS — C44521 Squamous cell carcinoma of skin of breast: Secondary | ICD-10-CM | POA: Diagnosis not present

## 2017-04-29 DIAGNOSIS — C44622 Squamous cell carcinoma of skin of right upper limb, including shoulder: Secondary | ICD-10-CM | POA: Diagnosis not present

## 2017-04-29 DIAGNOSIS — C44529 Squamous cell carcinoma of skin of other part of trunk: Secondary | ICD-10-CM | POA: Diagnosis not present

## 2017-04-30 DIAGNOSIS — G4733 Obstructive sleep apnea (adult) (pediatric): Secondary | ICD-10-CM | POA: Diagnosis not present

## 2017-04-30 DIAGNOSIS — E669 Obesity, unspecified: Secondary | ICD-10-CM | POA: Diagnosis not present

## 2017-05-29 ENCOUNTER — Encounter (INDEPENDENT_AMBULATORY_CARE_PROVIDER_SITE_OTHER): Payer: Self-pay

## 2017-05-29 ENCOUNTER — Ambulatory Visit (INDEPENDENT_AMBULATORY_CARE_PROVIDER_SITE_OTHER): Payer: Self-pay | Admitting: Vascular Surgery

## 2017-07-01 DIAGNOSIS — I2510 Atherosclerotic heart disease of native coronary artery without angina pectoris: Secondary | ICD-10-CM | POA: Diagnosis not present

## 2017-07-01 DIAGNOSIS — E782 Mixed hyperlipidemia: Secondary | ICD-10-CM | POA: Diagnosis not present

## 2017-07-01 DIAGNOSIS — I447 Left bundle-branch block, unspecified: Secondary | ICD-10-CM | POA: Diagnosis not present

## 2017-07-01 DIAGNOSIS — I10 Essential (primary) hypertension: Secondary | ICD-10-CM | POA: Diagnosis not present

## 2017-07-06 ENCOUNTER — Other Ambulatory Visit (INDEPENDENT_AMBULATORY_CARE_PROVIDER_SITE_OTHER): Payer: Self-pay | Admitting: Vascular Surgery

## 2017-07-06 DIAGNOSIS — I739 Peripheral vascular disease, unspecified: Secondary | ICD-10-CM

## 2017-07-06 DIAGNOSIS — I708 Atherosclerosis of other arteries: Principal | ICD-10-CM

## 2017-07-06 DIAGNOSIS — I700 Atherosclerosis of aorta: Secondary | ICD-10-CM

## 2017-07-06 DIAGNOSIS — I779 Disorder of arteries and arterioles, unspecified: Secondary | ICD-10-CM

## 2017-07-07 ENCOUNTER — Ambulatory Visit (INDEPENDENT_AMBULATORY_CARE_PROVIDER_SITE_OTHER): Payer: BLUE CROSS/BLUE SHIELD | Admitting: Vascular Surgery

## 2017-07-07 ENCOUNTER — Encounter (INDEPENDENT_AMBULATORY_CARE_PROVIDER_SITE_OTHER): Payer: Self-pay | Admitting: Vascular Surgery

## 2017-07-07 ENCOUNTER — Ambulatory Visit (INDEPENDENT_AMBULATORY_CARE_PROVIDER_SITE_OTHER): Payer: BLUE CROSS/BLUE SHIELD

## 2017-07-07 VITALS — BP 136/79 | HR 66 | Resp 16 | Wt 214.0 lb

## 2017-07-07 DIAGNOSIS — I708 Atherosclerosis of other arteries: Secondary | ICD-10-CM

## 2017-07-07 DIAGNOSIS — I6529 Occlusion and stenosis of unspecified carotid artery: Secondary | ICD-10-CM | POA: Insufficient documentation

## 2017-07-07 DIAGNOSIS — I700 Atherosclerosis of aorta: Secondary | ICD-10-CM

## 2017-07-07 DIAGNOSIS — I779 Disorder of arteries and arterioles, unspecified: Secondary | ICD-10-CM | POA: Diagnosis not present

## 2017-07-07 DIAGNOSIS — I6523 Occlusion and stenosis of bilateral carotid arteries: Secondary | ICD-10-CM

## 2017-07-07 DIAGNOSIS — I739 Peripheral vascular disease, unspecified: Secondary | ICD-10-CM

## 2017-07-07 DIAGNOSIS — I10 Essential (primary) hypertension: Secondary | ICD-10-CM

## 2017-07-07 NOTE — Patient Instructions (Signed)
Carotid Artery Disease The carotid arteries are arteries on both sides of the neck. They carry blood to the brain. Carotid artery disease is when the arteries get smaller (narrow) or get blocked. If these arteries get smaller or get blocked, you are more likely to have a stroke or warning stroke (transient ischemic attack). Follow these instructions at home:  Take medicines as told by your doctor. Make sure you understand all your medicine instructions. Do not stop your medicines without talking to your doctor first.  Follow your doctor's diet instructions. It is important to eat a healthy diet that includes plenty of: ? Fresh fruits. ? Vegetables. ? Lean meats.  Avoid: ? High-fat foods. ? High-sodium foods. ? Foods that are fried, overly processed, or have poor nutritional value.  Stay a healthy weight.  Stay active. Get at least 30 minutes of activity every day.  Do not smoke.  Limit alcohol use to: ? No more than 2 drinks a day for men. ? No more than 1 drink a day for women who are not pregnant.  Do not use illegal drugs.  Keep all doctor visits as told. Get help right away if:  You have sudden weakness or loss of feeling (numbness) on one side of the body, such as the face, arm, or leg.  You have sudden confusion.  You have trouble speaking (aphasia) or understanding.  You have sudden trouble seeing out of one or both eyes.  You have sudden trouble walking.  You have dizziness or feel like you might pass out (faint).  You have a loss of balance or your movements are not steady (uncoordinated).  You have a sudden, severe headache with no known cause.  You have trouble swallowing (dysphagia). Call your local emergency services (911 in [place] Do notdrive yourself to the clinic or hospital. This information is not intended to replace advice given to you by your health care provider. Make sure you discuss any questions you have with your health care  provider. Document Released: 11/10/2012 Document Revised: 05/01/2016 Document Reviewed: 05/25/2013 Elsevier Interactive Patient Education  2018 Elsevier Inc.  

## 2017-07-07 NOTE — Assessment & Plan Note (Signed)
blood pressure control important in reducing the progression of atherosclerotic disease. On appropriate oral medications.  

## 2017-07-07 NOTE — Assessment & Plan Note (Signed)
His duplex today shows good velocities in his left carotid endarterectomy site with no significant recurrent stenosis but his right carotid artery has had increased velocities and now follows in the 60-79%. This represents a significant progression from his study last year. He will continue his aspirin and statin agent. At this point with a significant progression in his disease, I would recommend shortening his follow-up and rechecking him in 6 months.

## 2017-07-07 NOTE — Assessment & Plan Note (Signed)
Several years status post aortoiliac intervention for short distance claudication. Markedly improved symptoms. ABIs are normal today at 1.03 on the right and 0.99 on the left with stable mildly elevated velocities in the aortoiliac system. Continue current medical regimen. Recommend formal exercise regimen. Recheck in 1 year.

## 2017-07-07 NOTE — Assessment & Plan Note (Signed)
Status post aortoiliac intervention several years ago and doing well.

## 2017-07-07 NOTE — Progress Notes (Signed)
MRN : [id number]  Charles Blake is a 61 y.o. (11-15-1956) male who presents with chief complaint of  Chief Complaint  Patient presents with  . ultrasound follow up  .  History of Present Illness: Patient returns today in follow up of carotid disease and aortoiliac disease. He denies any focal neurologic symptoms. He is about 3 years status post left carotid endarterectomy. His duplex today shows good velocities in his left carotid endarterectomy site with no significant recurrent stenosis but his right carotid artery has had increased velocities and now follows in the 60-79%. This represents a significant progression from his study last year. His claudication symptoms have been essentially resolved with his intervention several years ago. No rest pain, ulceration, or infection. No fever or chills. ABIs are normal today at 1.03 on the right and 0.99 on the left with stable mildly elevated velocities in the aortoiliac system.  Current Outpatient Prescriptions  Medication Sig Dispense Refill  . acetaminophen (TYLENOL) 650 MG CR tablet Take 650 mg by mouth every 8 (eight) hours as needed for pain.    Marland Kitchen albuterol (PROVENTIL HFA;VENTOLIN HFA) 108 (90 Base) MCG/ACT inhaler Inhale 2 puffs into the lungs every 4 (four) hours as needed for wheezing. 1 Inhaler 0  . amLODipine (NORVASC) 10 MG tablet Take 10 mg by mouth daily.    Marland Kitchen aspirin 81 MG tablet Take 81 mg by mouth daily.    Marland Kitchen atorvastatin (LIPITOR) 40 MG tablet Take by mouth.    . benzonatate (TESSALON PERLES) 100 MG capsule Take 1 capsule (100 mg total) by mouth 3 (three) times daily as needed for cough. 15 capsule 0  . carvedilol (COREG) 3.125 MG tablet Take 3.125 mg by mouth 2 (two) times daily with a meal.    . clonazePAM (KLONOPIN) 0.5 MG tablet Take 0.5 mg by mouth at bedtime.    Marland Kitchen escitalopram (LEXAPRO) 20 MG tablet Take 20 mg by mouth daily.    Marland Kitchen losartan-hydrochlorothiazide (HYZAAR) 100-25 MG per tablet Take 1 tablet by mouth daily.     . pantoprazole (PROTONIX) 40 MG tablet Take 40 mg by mouth daily.    Marland Kitchen HYDROcodone-acetaminophen (NORCO/VICODIN) 5-325 MG tablet Take 1 tablet by mouth every 4 (four) hours as needed. (Patient not taking: Reported on 07/07/2017) 15 tablet 0  . pravastatin (PRAVACHOL) 80 MG tablet Take 80 mg by mouth daily.    . tadalafil (CIALIS) 20 MG tablet Take 20 mg by mouth daily as needed for erectile dysfunction.     No current facility-administered medications for this visit.     Past Medical History:  Diagnosis Date  . Arthritis   . Cancer ([place])   . GERD (gastroesophageal reflux disease)   . History of hiatal hernia   . Hypertension     Past Surgical History:  Procedure Laterality Date  . COLONOSCOPY N/A 05/29/2015   Procedure: COLONOSCOPY;  Surgeon: Lollie Sails, MD;  Location[facility] ENDOSCOPY;  Service: Endoscopy;  Laterality: N/A;  . TONSILLECTOMY      Social History Social History  Substance Use Topics  . Smoking status: Current Every Day Smoker    Types: Cigarettes  . Smokeless tobacco: Never Used  . Alcohol use No   No IV drug use  Family History No bleeding or clotting disorders, no aneurysms, no autoimmune diseases, no porphyria  No Known Allergies   REVIEW OF SYSTEMS (Negative unless checked)  Constitutional: [] Weight loss  [] Fever  [] Chills Cardiac: [] Chest pain   [] Chest pressure   []   Palpitations   [] Shortness of breath when laying flat   [] Shortness of breath at rest   [] Shortness of breath with exertion. Vascular:  [] Pain in legs with walking   [] Pain in legs at rest   [] Pain in legs when laying flat   [] Claudication   [] Pain in feet when walking  [] Pain in feet at rest  [] Pain in feet when laying flat   [] History of DVT   [] Phlebitis   [] Swelling in legs   [] Varicose veins   [] Non-healing ulcers Pulmonary:   [] Uses home oxygen   [] Productive cough   [] Hemoptysis   [] Wheeze  [] COPD   [] Asthma Neurologic:  [] Dizziness  [] Blackouts   [] Seizures   [] History of  stroke   [] History of TIA  [] Aphasia   [] Temporary blindness   [] Dysphagia   [] Weakness or numbness in arms   [] Weakness or numbness in legs Musculoskeletal:  [x] Arthritis   [] Joint swelling   [] Joint pain   [] Low back pain Hematologic:  [] Easy bruising  [] Easy bleeding   [] Hypercoagulable state   [] Anemic   Gastrointestinal:  [] Blood in stool   [] Vomiting blood  [x] Gastroesophageal reflux/heartburn   [] Abdominal pain Genitourinary:  [] Chronic kidney disease   [] Difficult urination  [] Frequent urination  [] Burning with urination   [] Hematuria Skin:  [] Rashes   [] Ulcers   [] Wounds Psychological:  [] History of anxiety   []  History of major depression.  Physical Examination  BP 136/79   Pulse 66   Resp 16   Wt 214 lb (97.1 kg)   BMI 33.52 kg/m  Gen:  WD/WN, NAD Head: [place]/AT, No temporalis wasting. Ear/Nose/Throat: Hearing grossly intact, nares w/o erythema or drainage, trachea midline Eyes: Conjunctiva clear. Sclera non-icteric Neck: Supple.  No JVD. Right carotid bruit is present Pulmonary:  Good air movement, no use of accessory muscles.  Cardiac: RRR, normal S1, S2 Vascular:  Vessel Right Left  Radial Palpable Palpable                          PT Palpable Palpable  DP Palpable Palpable   Gastrointestinal: soft, non-tender/non-distended. No guarding/reflex.  Musculoskeletal: M/S 5/5 throughout.  No deformity or atrophy. Trace lower extremity edema. Neurologic: Sensation grossly intact in extremities.  Symmetrical.  Speech is fluent.  Psychiatric: Judgment intact, Mood & affect appropriate for pt's clinical situation. Dermatologic: No rashes or ulcers noted.  No cellulitis or open wounds.       Labs No results found for this or any previous visit (from the past 2160 hour(s)).  Radiology No results found.   Assessment/Plan  Hypertension blood pressure control important in reducing the progression of atherosclerotic disease. On appropriate oral  medications.   Aortic atherosclerosis ([place]) Status post aortoiliac intervention several years ago and doing well.  Carotid artery stenosis His duplex today shows good velocities in his left carotid endarterectomy site with no significant recurrent stenosis but his right carotid artery has had increased velocities and now follows in the 60-79%. This represents a significant progression from his study last year. He will continue his aspirin and statin agent. At this point with a significant progression in his disease, I would recommend shortening his follow-up and rechecking him in 6 months.  PAD (peripheral artery disease) ([facility]) Several years status post aortoiliac intervention for short distance claudication. Markedly improved symptoms. ABIs are normal today at 1.03 on the right and 0.99 on the left with stable mildly elevated velocities in the aortoiliac system. Continue current medical regimen.  Recommend formal exercise regimen. Recheck in 1 year.    Leotis Pain, MD  07/07/2017 11:55 AM    This note was created with Dragon medical transcription system.  Any errors from dictation are purely unintentional

## 2017-08-06 DIAGNOSIS — G4733 Obstructive sleep apnea (adult) (pediatric): Secondary | ICD-10-CM | POA: Diagnosis not present

## 2017-08-06 DIAGNOSIS — E782 Mixed hyperlipidemia: Secondary | ICD-10-CM | POA: Diagnosis not present

## 2017-08-06 DIAGNOSIS — I2510 Atherosclerotic heart disease of native coronary artery without angina pectoris: Secondary | ICD-10-CM | POA: Diagnosis not present

## 2017-08-06 DIAGNOSIS — I10 Essential (primary) hypertension: Secondary | ICD-10-CM | POA: Diagnosis not present

## 2017-09-22 DIAGNOSIS — L570 Actinic keratosis: Secondary | ICD-10-CM | POA: Diagnosis not present

## 2017-09-22 DIAGNOSIS — Z872 Personal history of diseases of the skin and subcutaneous tissue: Secondary | ICD-10-CM | POA: Diagnosis not present

## 2017-09-22 DIAGNOSIS — L578 Other skin changes due to chronic exposure to nonionizing radiation: Secondary | ICD-10-CM | POA: Diagnosis not present

## 2017-09-22 DIAGNOSIS — Z859 Personal history of malignant neoplasm, unspecified: Secondary | ICD-10-CM | POA: Diagnosis not present

## 2017-09-22 DIAGNOSIS — Z85820 Personal history of malignant melanoma of skin: Secondary | ICD-10-CM | POA: Diagnosis not present

## 2017-10-12 DIAGNOSIS — G4733 Obstructive sleep apnea (adult) (pediatric): Secondary | ICD-10-CM | POA: Diagnosis not present

## 2017-12-10 DIAGNOSIS — E782 Mixed hyperlipidemia: Secondary | ICD-10-CM | POA: Diagnosis not present

## 2017-12-10 DIAGNOSIS — I447 Left bundle-branch block, unspecified: Secondary | ICD-10-CM | POA: Diagnosis not present

## 2017-12-10 DIAGNOSIS — I2510 Atherosclerotic heart disease of native coronary artery without angina pectoris: Secondary | ICD-10-CM | POA: Diagnosis not present

## 2017-12-10 DIAGNOSIS — I10 Essential (primary) hypertension: Secondary | ICD-10-CM | POA: Diagnosis not present

## 2017-12-15 DIAGNOSIS — E782 Mixed hyperlipidemia: Secondary | ICD-10-CM | POA: Diagnosis not present

## 2017-12-15 DIAGNOSIS — K219 Gastro-esophageal reflux disease without esophagitis: Secondary | ICD-10-CM | POA: Diagnosis not present

## 2017-12-15 DIAGNOSIS — I10 Essential (primary) hypertension: Secondary | ICD-10-CM | POA: Diagnosis not present

## 2017-12-15 DIAGNOSIS — I739 Peripheral vascular disease, unspecified: Secondary | ICD-10-CM | POA: Diagnosis not present

## 2017-12-28 DIAGNOSIS — R739 Hyperglycemia, unspecified: Secondary | ICD-10-CM | POA: Diagnosis not present

## 2018-01-05 ENCOUNTER — Ambulatory Visit (INDEPENDENT_AMBULATORY_CARE_PROVIDER_SITE_OTHER): Payer: BLUE CROSS/BLUE SHIELD | Admitting: Vascular Surgery

## 2018-01-05 ENCOUNTER — Encounter (INDEPENDENT_AMBULATORY_CARE_PROVIDER_SITE_OTHER): Payer: BLUE CROSS/BLUE SHIELD

## 2018-01-12 ENCOUNTER — Ambulatory Visit (INDEPENDENT_AMBULATORY_CARE_PROVIDER_SITE_OTHER): Payer: BLUE CROSS/BLUE SHIELD | Admitting: Vascular Surgery

## 2018-01-12 ENCOUNTER — Encounter (INDEPENDENT_AMBULATORY_CARE_PROVIDER_SITE_OTHER): Payer: Self-pay | Admitting: Vascular Surgery

## 2018-01-12 ENCOUNTER — Ambulatory Visit (INDEPENDENT_AMBULATORY_CARE_PROVIDER_SITE_OTHER): Payer: BLUE CROSS/BLUE SHIELD

## 2018-01-12 VITALS — BP 134/70 | HR 69 | Resp 16 | Ht 66.0 in | Wt 225.0 lb

## 2018-01-12 DIAGNOSIS — I2510 Atherosclerotic heart disease of native coronary artery without angina pectoris: Secondary | ICD-10-CM | POA: Diagnosis not present

## 2018-01-12 DIAGNOSIS — I6523 Occlusion and stenosis of bilateral carotid arteries: Secondary | ICD-10-CM

## 2018-01-12 DIAGNOSIS — G4733 Obstructive sleep apnea (adult) (pediatric): Secondary | ICD-10-CM | POA: Diagnosis not present

## 2018-01-12 DIAGNOSIS — E782 Mixed hyperlipidemia: Secondary | ICD-10-CM | POA: Diagnosis not present

## 2018-01-12 DIAGNOSIS — I700 Atherosclerosis of aorta: Secondary | ICD-10-CM

## 2018-01-12 DIAGNOSIS — I10 Essential (primary) hypertension: Secondary | ICD-10-CM | POA: Diagnosis not present

## 2018-01-12 NOTE — Patient Instructions (Signed)
Carotid Artery Disease The carotid arteries are the two main arteries on either side of the neck that supply blood to the brain. Carotid artery disease, also called carotid artery stenosis, is the narrowing or blockage of one or both carotid arteries. Carotid artery disease increases your risk for a stroke or a transient ischemic attack (TIA). A TIA is an episode in which a waxy, fatty substance that accumulates within the artery (plaque) blocks blood flow to the brain. A TIA is considered a "warning stroke." What are the causes?  Buildup of plaque inside the carotid arteries (atherosclerosis) (common).  A weakened outpouching in an artery (aneurysm).  Inflammation of the carotid artery (arteritis).  A fibrous growth within the carotid artery (fibromuscular dysplasia).  Tissue death within the carotid artery due to radiation treatment (post-radiation necrosis).  Decreased blood flow due to spasms of the carotid artery (vasospasm).  Separation of the walls of the carotid artery (carotid dissection). What increases the risk?  High cholesterol (dyslipidemia).  High blood pressure (hypertension).  Smoking.  Obesity.  Diabetes.  Family history of cardiovascular disease.  Inactivity or lack of regular exercise.  Being male. Men have an increased risk of developing atherosclerosis earlier in life than women. What are the signs or symptoms? Carotid artery disease does not cause symptoms. How is this diagnosed? Diagnosis of carotid artery disease may include:  A physical exam. Your health care provider may hear an abnormal sound (bruit) when listening to the carotid arteries.  Specific tests that look at the blood flow in the carotid arteries. These tests include: ? Carotid artery ultrasonography. ? Carotid or cerebral angiography. ? Computerized tomographic angiography (CTA). ? Magnetic resonance angiography (MRA).  How is this treated? Treatment of carotid artery disease can  include a combination of treatments. Treatment options include:  Surgery. You may have: ? A carotid endarterectomy. This is a surgery to remove the blockages in the carotid arteries. ? A carotid angioplasty with stenting. This is a nonsurgical interventional procedure. A wire mesh (stent) is used to widen the blocked carotid arteries.  Medicines to control blood pressure, cholesterol, and reduce blood clotting (antiplatelet therapy).  Adjusting your diet.  Lifestyle changes such as: ? Quitting smoking. ? Exercising as tolerated or as directed by your health care provider. ? Controlling and maintaining a good blood pressure. ? Keeping cholesterol levels under control.  Follow these instructions at home:  Take medicines only as directed by your health care provider. Make sure you understand all your medicine instructions. Do not stop your medicines without talking to your health care provider.  Follow your health care provider's diet instructions. It is important to eat a healthy diet that is low in saturated fats and includes plenty of fresh fruits, vegetables, and lean meats. High-fat, high-sodium foods as well as foods that are fried, overly processed, or have poor nutritional value should be avoided.  Maintain a healthy weight.  Stay physically active. It is recommended that you get at least 30 minutes of activity every day.  Do not use any tobacco products including cigarettes, chewing tobacco, or electronic cigarettes. If you need help quitting, ask your health care provider.  Limit alcohol use to: ? No more than 2 drinks per day for men. ? No more than 1 drink per day for nonpregnant women.  Do not use illegal drugs.  Keep all follow-up visits as directed by your health care provider. Get help right away if: You develop TIA or stroke symptoms. These include:  Sudden   weakness or numbness on one side of the body, such as in the face, arm, or leg.  Sudden  confusion.  Trouble speaking (aphasia) or understanding.  Sudden trouble seeing out of one or both eyes.  Sudden trouble walking.  Dizziness or feeling like you might faint.  Loss of balance or coordination.  Sudden severe headache with no known cause.  Sudden trouble swallowing (dysphagia).  If you have any of these symptoms, call your local emergency services (911 in [place] Do not drive yourself to the clinic or hospital. This is a medical emergency. This information is not intended to replace advice given to you by your health care provider. Make sure you discuss any questions you have with your health care provider. Document Released: 02/16/2012 Document Revised: 05/01/2016 Document Reviewed: 05/25/2013 Elsevier Interactive Patient Education  2017 [employer]vier Inc.  

## 2018-01-12 NOTE — Progress Notes (Signed)
MRN : [id number]  Charles Blake is a 62 y.o. (December 13, 1955) male who presents with chief complaint of  Chief Complaint  Patient presents with  . Follow-up  . Carotid  .  History of Present Illness: Patient returns in follow-up of his carotid disease.  He is about 4 years status post left carotid endarterectomy for high-grade stenosis.  He is doing well currently without any focal neurologic symptoms. Specifically, the patient denies amaurosis fugax, speech or swallowing difficulties, or arm or leg weakness or numbness.  His carotid duplex today actually shows slightly better velocities than on his last visit which now measure in the upper end of the 40-59% range on the right.  His left carotid endarterectomy has mild intimal hyperplasia but no significant stenosis.  Previously, his right carotid measured on the lower end of the 60-79%.         Current Outpatient Prescriptions  Medication Sig Dispense Refill  . acetaminophen (TYLENOL) 650 MG CR tablet Take 650 mg by mouth every 8 (eight) hours as needed for pain.    Marland Kitchen albuterol (PROVENTIL HFA;VENTOLIN HFA) 108 (90 Base) MCG/ACT inhaler Inhale 2 puffs into the lungs every 4 (four) hours as needed for wheezing. 1 Inhaler 0  . amLODipine (NORVASC) 10 MG tablet Take 10 mg by mouth daily.    Marland Kitchen aspirin 81 MG tablet Take 81 mg by mouth daily.    Marland Kitchen atorvastatin (LIPITOR) 40 MG tablet Take by mouth.    . benzonatate (TESSALON PERLES) 100 MG capsule Take 1 capsule (100 mg total) by mouth 3 (three) times daily as needed for cough. 15 capsule 0  . carvedilol (COREG) 3.125 MG tablet Take 3.125 mg by mouth 2 (two) times daily with a meal.    . clonazePAM (KLONOPIN) 0.5 MG tablet Take 0.5 mg by mouth at bedtime.    Marland Kitchen escitalopram (LEXAPRO) 20 MG tablet Take 20 mg by mouth daily.    Marland Kitchen losartan-hydrochlorothiazide (HYZAAR) 100-25 MG per tablet Take 1 tablet by mouth daily.    . pantoprazole (PROTONIX) 40 MG tablet Take 40 mg by mouth  daily.    Marland Kitchen HYDROcodone-acetaminophen (NORCO/VICODIN) 5-325 MG tablet Take 1 tablet by mouth every 4 (four) hours as needed. (Patient not taking: Reported on 07/07/2017) 15 tablet 0  . pravastatin (PRAVACHOL) 80 MG tablet Take 80 mg by mouth daily.    . tadalafil (CIALIS) 20 MG tablet Take 20 mg by mouth daily as needed for erectile dysfunction.     No current facility-administered medications for this visit.         Past Medical History:  Diagnosis Date  . Arthritis   . Cancer ([place])   . GERD (gastroesophageal reflux disease)   . History of hiatal hernia   . Hypertension          Past Surgical History:  Procedure Laterality Date  . COLONOSCOPY N/A 05/29/2015   Procedure: COLONOSCOPY;  Surgeon: Lollie Sails, MD;  Location: [facility] ENDOSCOPY;  Service: Endoscopy;  Laterality: N/A;  . TONSILLECTOMY      Social History      Social History  Substance Use Topics  . Smoking status: Current Every Day Smoker    Types: Cigarettes  . Smokeless tobacco: Never Used  . Alcohol use No   No IV drug use  Family History No bleeding or clotting disorders, no aneurysms, no autoimmune diseases, no porphyria  No Known Allergies   REVIEW OF SYSTEMS (Negative unless checked)  Constitutional: [] Weight loss  []   Fever  [] Chills Cardiac: [] Chest pain   [] Chest pressure   [] Palpitations   [] Shortness of breath when laying flat   [] Shortness of breath at rest   [] Shortness of breath with exertion. Vascular:  [] Pain in legs with walking   [] Pain in legs at rest   [] Pain in legs when laying flat   [] Claudication   [] Pain in feet when walking  [] Pain in feet at rest  [] Pain in feet when laying flat   [] History of DVT   [] Phlebitis   [] Swelling in legs   [] Varicose veins   [] Non-healing ulcers Pulmonary:   [] Uses home oxygen   [] Productive cough   [] Hemoptysis   [] Wheeze  [] COPD   [] Asthma Neurologic:  [] Dizziness  [] Blackouts   [] Seizures   [] History of stroke   [] History of  TIA  [] Aphasia   [] Temporary blindness   [] Dysphagia   [] Weakness or numbness in arms   [] Weakness or numbness in legs Musculoskeletal:  [x] Arthritis   [] Joint swelling   [] Joint pain   [] Low back pain Hematologic:  [] Easy bruising  [] Easy bleeding   [] Hypercoagulable state   [] Anemic   Gastrointestinal:  [] Blood in stool   [] Vomiting blood  [x] Gastroesophageal reflux/heartburn   [] Abdominal pain Genitourinary:  [] Chronic kidney disease   [] Difficult urination  [] Frequent urination  [] Burning with urination   [] Hematuria Skin:  [] Rashes   [] Ulcers   [] Wounds Psychological:  [] History of anxiety   []  History of major depression.     Physical Examination  Vitals:   01/12/18 1002  BP: 134/70  Pulse: 69  Resp: 16  Weight: 102.1 kg (225 lb)  Height: 5\' 6"  (1.676 m)   Body mass index is 36.32 kg/m. Gen:  WD/WN, NAD Head: [place]/AT, No temporalis wasting. Ear/Nose/Throat: Hearing grossly intact, nares w/o erythema or drainage, trachea midline Eyes: Conjunctiva clear. Sclera non-icteric Neck: Supple.  No bruits. Left CEA incision well healed.  Pulmonary:  Good air movement, equal and clear to auscultation bilaterally.  Cardiac: RRR, no JVD Vascular:  Vessel Right Left  Radial Palpable Palpable                                    Musculoskeletal: M/S 5/5 throughout.  No deformity or atrophy.  Neurologic: CN 2-12 intact. Sensation grossly intact in extremities.  Symmetrical.  Speech is fluent. Motor exam as listed above. Psychiatric: Judgment intact, Mood & affect appropriate for pt's clinical situation. Dermatologic: No rashes or ulcers noted.  No cellulitis or open wounds.      CBC Lab Results  Component Value Date   WBC 20.2 (H) 03/30/2017   HGB 16.3 03/30/2017   HCT 46.6 03/30/2017   MCV 91.3 03/30/2017   PLT 312 03/30/2017    BMET    Component Value Date/Time   NA 133 (L) 03/30/2017 0914   NA 139 03/02/2014 0430   K 3.2 (L) 03/30/2017 0914   K 3.9 03/02/2014  0430   CL 97 (L) 03/30/2017 0914   CL 106 03/02/2014 0430   CO2 27 03/30/2017 0914   CO2 29 03/02/2014 0430   GLUCOSE 126 (H) 03/30/2017 0914   GLUCOSE 123 (H) 03/02/2014 0430   BUN 12 03/30/2017 0914   BUN 13 03/02/2014 0430   CREATININE 0.87 03/30/2017 0914   CREATININE 0.96 03/02/2014 0430   CALCIUM 8.6 (L) 03/30/2017 0914   CALCIUM 7.6 (L) 03/02/2014 0430   GFRNONAA >60 03/30/2017 9323  GFRNONAA >60 03/02/2014 0430   GFRAA >60 03/30/2017 0914   GFRAA >60 03/02/2014 0430   CrCl cannot be calculated (Patient's most recent lab result is older than the maximum 21 days allowed.).  COAG Lab Results  Component Value Date   INR 1.1 03/02/2014    Radiology No results found.    Assessment/Plan  Hypertension blood pressure control important in reducing the progression of atherosclerotic disease. On appropriate oral medications.   Aortic atherosclerosis ([place]) Status post aortoiliac intervention several years ago and doing well.   Carotid artery stenosis His carotid duplex today actually shows slightly better velocities than on his last visit which now measure in the upper end of the 40-59% range on the right.  His left carotid endarterectomy has mild intimal hyperplasia but no significant stenosis.  Previously, his right carotid measured on the lower end of the 60-79%. He will continue his aspirin and statin agent.  I will go ahead and see him back in 6 months rather than a year given his moderate stenosis that is on the borderline higher degree today and has previously been in the 60-79% range.     Leotis Pain, MD  01/12/2018 10:58 AM    This note was created with Dragon medical transcription system.  Any errors from dictation are purely unintentional

## 2018-01-12 NOTE — Assessment & Plan Note (Signed)
His carotid duplex today actually shows slightly better velocities than on his last visit which now measure in the upper end of the 40-59% range on the right.  His left carotid endarterectomy has mild intimal hyperplasia but no significant stenosis.  Previously, his right carotid measured on the lower end of the 60-79%. He will continue his aspirin and statin agent.  I will go ahead and see him back in 6 months rather than a year given his moderate stenosis that is on the borderline higher degree today and has previously been in the 60-79% range.

## 2018-01-16 ENCOUNTER — Other Ambulatory Visit: Payer: Self-pay

## 2018-01-16 ENCOUNTER — Encounter: Payer: Self-pay | Admitting: Emergency Medicine

## 2018-01-16 ENCOUNTER — Emergency Department
Admission: EM | Admit: 2018-01-16 | Discharge: 2018-01-16 | Disposition: A | Discharge status: Home / Self Care | Payer: BLUE CROSS/BLUE SHIELD | Attending: Emergency Medicine | Admitting: Emergency Medicine

## 2018-01-16 DIAGNOSIS — Y93H2 Activity, gardening and landscaping: Secondary | ICD-10-CM | POA: Insufficient documentation

## 2018-01-16 DIAGNOSIS — Z79899 Other long term (current) drug therapy: Secondary | ICD-10-CM | POA: Insufficient documentation

## 2018-01-16 DIAGNOSIS — I10 Essential (primary) hypertension: Secondary | ICD-10-CM | POA: Diagnosis not present

## 2018-01-16 DIAGNOSIS — S79921A Unspecified injury of right thigh, initial encounter: Secondary | ICD-10-CM | POA: Diagnosis present

## 2018-01-16 DIAGNOSIS — S76911A Strain of unspecified muscles, fascia and tendons at thigh level, right thigh, initial encounter: Secondary | ICD-10-CM | POA: Diagnosis not present

## 2018-01-16 DIAGNOSIS — X500XXA Overexertion from strenuous movement or load, initial encounter: Secondary | ICD-10-CM | POA: Diagnosis not present

## 2018-01-16 DIAGNOSIS — Y998 Other external cause status: Secondary | ICD-10-CM | POA: Insufficient documentation

## 2018-01-16 DIAGNOSIS — Z7982 Long term (current) use of aspirin: Secondary | ICD-10-CM | POA: Insufficient documentation

## 2018-01-16 DIAGNOSIS — Y92007 Garden or yard of unspecified non-institutional (private) residence as the place of occurrence of the external cause: Secondary | ICD-10-CM | POA: Diagnosis not present

## 2018-01-16 DIAGNOSIS — S86911A Strain of unspecified muscle(s) and tendon(s) at lower leg level, right leg, initial encounter: Secondary | ICD-10-CM | POA: Diagnosis not present

## 2018-01-16 DIAGNOSIS — F17210 Nicotine dependence, cigarettes, uncomplicated: Secondary | ICD-10-CM | POA: Insufficient documentation

## 2018-01-16 MED ORDER — CYCLOBENZAPRINE HCL 10 MG PO TABS
10.0000 mg | ORAL_TABLET | Freq: Once | ORAL | Status: AC
Start: 2018-01-16 — End: 2018-01-16
  Administered 2018-01-16: 10 mg via ORAL
  Filled 2018-01-16: qty 1

## 2018-01-16 MED ORDER — KETOROLAC TROMETHAMINE 30 MG/ML IJ SOLN
30.0000 mg | Freq: Once | INTRAMUSCULAR | Status: AC
  Administered 2018-01-16: 30 mg via INTRAMUSCULAR
  Filled 2018-01-16: qty 1

## 2018-01-16 MED ORDER — TRAMADOL HCL 50 MG PO TABS: 50.0000 mg | ORAL_TABLET | Freq: Four times a day (QID) | ORAL | 0 refills | Status: DC | PRN

## 2018-01-16 MED ORDER — CYCLOBENZAPRINE HCL 10 MG PO TABS: 10.0000 mg | ORAL_TABLET | Freq: Three times a day (TID) | ORAL | 0 refills | Status: DC | PRN

## 2018-01-16 MED ORDER — TRAMADOL HCL 50 MG PO TABS
50.0000 mg | ORAL_TABLET | Freq: Once | ORAL | Status: AC
Start: 2018-01-16 — End: 2018-01-16
  Administered 2018-01-16: 50 mg via ORAL
  Filled 2018-01-16: qty 1

## 2018-01-16 MED ORDER — KETOROLAC TROMETHAMINE 10 MG PO TABS: 10.0000 mg | ORAL_TABLET | Freq: Four times a day (QID) | ORAL | 0 refills | Status: DC | PRN

## 2018-01-16 NOTE — Discharge Instructions (Signed)
Take medication as directed.

## 2018-01-16 NOTE — ED Triage Notes (Signed)
Pt to ed with c/o right leg pain starting at knee and radiating up to thigh.  Pt states pain x 3 days.

## 2018-01-16 NOTE — ED Provider Notes (Signed)
[facility] Emergency Department Provider Note   ____________________________________________   First MD Initiated Contact with Patient 01/16/18 1158     (approximate)  I have reviewed the triage vital signs and the nursing notes.   HISTORY  Chief Complaint Leg Injury    HPI Charles Blake is a 62 y.o. male patient complain posterior right thigh pain for 3 days.  Patient states there has been increased physical activity [place] is warm weather performing yard work.  Patient denies chest pain or dyspnea.  Patient rates the pain as a 10/10.  Patient described the pain is "aching".  Patient states pain increases with flexion of the knee and squatting.  Patient had no relief and anti-inflammatory medications.  Patient state pain is  interfering with sleeping.   Past Medical History:  Diagnosis Date  . Arthritis   . Cancer ([place])   . GERD (gastroesophageal reflux disease)   . History of hiatal hernia   . Hypertension     Patient Active Problem List   Diagnosis Date Noted  . Hypertension 07/07/2017  . Aortic atherosclerosis ([place]) 07/07/2017  . Carotid artery stenosis 07/07/2017  . PAD (peripheral artery disease) ([place]) 07/07/2017    Past Surgical History:  Procedure Laterality Date  . COLONOSCOPY N/A 05/29/2015   Procedure: COLONOSCOPY;  Surgeon: Lollie Sails, MD;  Location: [facility] ENDOSCOPY;  Service: Endoscopy;  Laterality: N/A;  . TONSILLECTOMY      Prior to Admission medications   Medication Sig Start Date End Date Taking? Authorizing Provider  acetaminophen (TYLENOL) 650 MG CR tablet Take 650 mg by mouth every 8 (eight) hours as needed for pain.    [provider]  albuterol (PROVENTIL HFA;VENTOLIN HFA) 108 (90 Base) MCG/ACT inhaler Inhale 2 puffs into the lungs every 4 (four) hours as needed for wheezing. 02/02/17   Marylene Land, NP  amLODipine (NORVASC) 10 MG tablet Take 10 mg by mouth daily.    [provider]  aspirin  81 MG tablet Take 81 mg by mouth daily.    [provider]  atorvastatin (LIPITOR) 40 MG tablet Take by mouth. 07/01/17 07/01/18  [provider]  benzonatate (TESSALON PERLES) 100 MG capsule Take 1 capsule (100 mg total) by mouth 3 (three) times daily as needed for cough. Patient not taking: Reported on 01/12/2018 02/02/17   Marylene Land, NP  carvedilol (COREG) 3.125 MG tablet Take 3.125 mg by mouth 2 (two) times daily with a meal.    [provider]  clonazePAM (KLONOPIN) 0.5 MG tablet Take 0.5 mg by mouth at bedtime.    [provider]  cyclobenzaprine (FLEXERIL) 10 MG tablet Take 1 tablet (10 mg total) by mouth 3 (three) times daily as needed. 01/16/18   Sable Feil, PA-C  escitalopram (LEXAPRO) 20 MG tablet Take 20 mg by mouth daily.    [provider]  HYDROcodone-acetaminophen (NORCO/VICODIN) 5-325 MG tablet Take 1 tablet by mouth every 4 (four) hours as needed. Patient not taking: Reported on 07/07/2017 03/30/17   Harvest Dark, MD  ketorolac (TORADOL) 10 MG tablet Take 1 tablet (10 mg total) by mouth every 6 (six) hours as needed. 01/16/18   Sable Feil, PA-C  losartan-hydrochlorothiazide (HYZAAR) 100-25 MG per tablet Take 1 tablet by mouth daily.    [provider]  pantoprazole (PROTONIX) 40 MG tablet Take 40 mg by mouth daily.    [provider]  pravastatin (PRAVACHOL) 80 MG tablet Take 80 mg by mouth daily.  [provider]  tadalafil (CIALIS) 20 MG tablet Take 20 mg by mouth daily as needed for erectile dysfunction.    [provider]  tiotropium (SPIRIVA) 18 MCG inhalation capsule Place 18 mcg into inhaler and inhale daily.    [provider]  traMADol (ULTRAM) 50 MG tablet Take 1 tablet (50 mg total) by mouth every 6 (six) hours as needed for moderate pain. 01/16/18   Sable Feil, PA-C    Allergies Patient has no known allergies.  History reviewed. No pertinent family  history.  Social History Social History   Tobacco Use  . Smoking status: Current Every Day Smoker    Types: Cigarettes  . Smokeless tobacco: Never Used  Substance Use Topics  . Alcohol use: No  . Drug use: Not on file    Review of Systems Constitutional: No fever/chills Eyes: No visual changes. ENT: No sore throat. Cardiovascular: Denies chest pain. Respiratory: Denies shortness of breath. Gastrointestinal: No abdominal pain.  No nausea, no vomiting.  No diarrhea.  No constipation. Genitourinary: Negative for dysuria. Musculoskeletal: Right posterior thigh pain. Skin: Negative for rash. Neurological: Negative for headaches, focal weakness or numbness. Psychiatric: Endocrine:Hypertension ____________________________________________   PHYSICAL EXAM:  VITAL SIGNS: ED Triage Vitals  Enc Vitals Group     BP 01/16/18 1029 (!) 158/75     Pulse Rate 01/16/18 1029 81     Resp 01/16/18 1029 18     Temp 01/16/18 1029 97.7 F (36.5 C)     Temp Source 01/16/18 1029 Oral     SpO2 01/16/18 1029 99 %     Weight 01/16/18 1030 225 lb (102.1 kg)     Height --      Head Circumference --      Peak Flow --      Pain Score 01/16/18 1030 10     Pain Loc --      Pain Edu? --      Excl. in [place]? --    Constitutional: Alert and oriented. Well appearing and in no acute distress. Cardiovascular: Normal rate, regular rhythm. Grossly normal heart sounds.  Good peripheral circulation. Respiratory: Normal respiratory effort.  No retractions. Lungs CTAB. Musculoskeletal: No obvious deformity of the right lower leg.  No edema or ecchymosis.  Patient has moderate guarding posterior right thigh.   Neurologic:  Normal speech and language. No gross focal neurologic deficits are appreciated. No gait instability. Skin:  Skin is warm, dry and intact. No rash noted. Psychiatric: Mood and affect are normal. Speech and behavior are normal.  ____________________________________________   LABS (all  labs ordered are listed, but only abnormal results are displayed)  Labs Reviewed - No data to display ____________________________________________  EKG   ____________________________________________  RADIOLOGY  ED MD interpretation:    Official radiology report(s): No results found.  ____________________________________________   PROCEDURES  Procedure(s) performed: None  Procedures  Critical Care performed: No  ____________________________________________   INITIAL IMPRESSION / ASSESSMENT AND PLAN / ED COURSE  As part of my medical decision making, I reviewed the following data within the electronic MEDICAL RECORD NUMBER    Leg pain secondary to muscle strain the right posterior thigh.  Patient given discharge care instruction.  Patient advised take medication as directed.  Patient advised follow-up PCP in 1 week if no improvement.      ____________________________________________   FINAL CLINICAL IMPRESSION(S) / ED DIAGNOSES  Final diagnoses:  Muscle strain of right thigh, initial encounter     ED Discharge Orders  Ordered    cyclobenzaprine (FLEXERIL) 10 MG tablet  3 times daily PRN     01/16/18 1315    traMADol (ULTRAM) 50 MG tablet  Every 6 hours PRN     01/16/18 1315    ketorolac (TORADOL) 10 MG tablet  Every 6 hours PRN     01/16/18 1315       Note:  This document was prepared using Dragon voice recognition software and may include unintentional dictation errors.    Linsey, Hirota, PA-C 01/16/18 1317    Lisa Roca, MD 01/16/18 [phone]

## 2018-01-16 NOTE — ED Notes (Signed)
Pt c/o right thigh pain, states it has been a few days he has been having pain but starting to get worse especially when he bends his leg. Pt states he recently started working out with his weight bench doing leg curls and may have hurt himself that way.

## 2018-02-27 DIAGNOSIS — H524 Presbyopia: Secondary | ICD-10-CM | POA: Diagnosis not present

## 2018-03-15 DIAGNOSIS — G4733 Obstructive sleep apnea (adult) (pediatric): Secondary | ICD-10-CM | POA: Diagnosis not present

## 2018-03-24 DIAGNOSIS — Z872 Personal history of diseases of the skin and subcutaneous tissue: Secondary | ICD-10-CM | POA: Diagnosis not present

## 2018-03-24 DIAGNOSIS — L578 Other skin changes due to chronic exposure to nonionizing radiation: Secondary | ICD-10-CM | POA: Diagnosis not present

## 2018-03-24 DIAGNOSIS — Z85820 Personal history of malignant melanoma of skin: Secondary | ICD-10-CM | POA: Diagnosis not present

## 2018-03-24 DIAGNOSIS — L570 Actinic keratosis: Secondary | ICD-10-CM | POA: Diagnosis not present

## 2018-03-24 DIAGNOSIS — Z859 Personal history of malignant neoplasm, unspecified: Secondary | ICD-10-CM | POA: Diagnosis not present

## 2018-04-12 DIAGNOSIS — H2511 Age-related nuclear cataract, right eye: Secondary | ICD-10-CM | POA: Diagnosis not present

## 2018-04-19 DIAGNOSIS — G4734 Idiopathic sleep related nonobstructive alveolar hypoventilation: Secondary | ICD-10-CM | POA: Diagnosis not present

## 2018-04-19 DIAGNOSIS — I10 Essential (primary) hypertension: Secondary | ICD-10-CM | POA: Diagnosis not present

## 2018-04-19 DIAGNOSIS — E669 Obesity, unspecified: Secondary | ICD-10-CM | POA: Diagnosis not present

## 2018-04-19 DIAGNOSIS — G4733 Obstructive sleep apnea (adult) (pediatric): Secondary | ICD-10-CM | POA: Diagnosis not present

## 2018-04-19 DIAGNOSIS — I259 Chronic ischemic heart disease, unspecified: Secondary | ICD-10-CM | POA: Diagnosis not present

## 2018-05-11 DIAGNOSIS — I2510 Atherosclerotic heart disease of native coronary artery without angina pectoris: Secondary | ICD-10-CM | POA: Diagnosis not present

## 2018-05-11 DIAGNOSIS — I420 Dilated cardiomyopathy: Secondary | ICD-10-CM | POA: Diagnosis not present

## 2018-05-11 DIAGNOSIS — I447 Left bundle-branch block, unspecified: Secondary | ICD-10-CM | POA: Diagnosis not present

## 2018-05-11 DIAGNOSIS — G4733 Obstructive sleep apnea (adult) (pediatric): Secondary | ICD-10-CM | POA: Diagnosis not present

## 2018-05-13 DIAGNOSIS — H2511 Age-related nuclear cataract, right eye: Secondary | ICD-10-CM | POA: Diagnosis not present

## 2018-05-19 ENCOUNTER — Other Ambulatory Visit: Payer: Self-pay

## 2018-05-21 NOTE — Discharge Instructions (Signed)

## 2018-05-24 ENCOUNTER — Ambulatory Visit: Payer: BLUE CROSS/BLUE SHIELD | Admitting: Anesthesiology

## 2018-05-24 ENCOUNTER — Encounter
Admission: RE | Disposition: A | Discharge status: Home / Self Care | Payer: Self-pay | Source: Ambulatory Visit | Attending: Ophthalmology

## 2018-05-24 ENCOUNTER — Ambulatory Visit
Admission: RE | Admit: 2018-05-24 | Discharge: 2018-05-24 | Disposition: A | Discharge status: Home / Self Care | Payer: BLUE CROSS/BLUE SHIELD | Source: Ambulatory Visit | Attending: Ophthalmology | Admitting: Ophthalmology

## 2018-05-24 DIAGNOSIS — H2511 Age-related nuclear cataract, right eye: Secondary | ICD-10-CM | POA: Diagnosis not present

## 2018-05-24 DIAGNOSIS — I10 Essential (primary) hypertension: Secondary | ICD-10-CM | POA: Insufficient documentation

## 2018-05-24 DIAGNOSIS — J449 Chronic obstructive pulmonary disease, unspecified: Secondary | ICD-10-CM | POA: Insufficient documentation

## 2018-05-24 DIAGNOSIS — I2510 Atherosclerotic heart disease of native coronary artery without angina pectoris: Secondary | ICD-10-CM | POA: Diagnosis not present

## 2018-05-24 DIAGNOSIS — H25811 Combined forms of age-related cataract, right eye: Secondary | ICD-10-CM | POA: Diagnosis not present

## 2018-05-24 DIAGNOSIS — Z85828 Personal history of other malignant neoplasm of skin: Secondary | ICD-10-CM | POA: Diagnosis not present

## 2018-05-24 DIAGNOSIS — G4730 Sleep apnea, unspecified: Secondary | ICD-10-CM | POA: Insufficient documentation

## 2018-05-24 HISTORY — DX: Anemia, unspecified: D64.9

## 2018-05-24 HISTORY — DX: Presence of dental prosthetic device (complete) (partial): Z97.2

## 2018-05-24 HISTORY — DX: Sleep apnea, unspecified: G47.30

## 2018-05-24 HISTORY — PX: CATARACT EXTRACTION W/PHACO: SHX586

## 2018-05-24 HISTORY — DX: Headache: R51

## 2018-05-24 HISTORY — DX: Atherosclerotic heart disease of native coronary artery without angina pectoris: I25.10

## 2018-05-24 HISTORY — DX: Pneumonia, unspecified organism: J18.9

## 2018-05-24 HISTORY — DX: Chronic obstructive pulmonary disease, unspecified: J44.9

## 2018-05-24 HISTORY — DX: Unspecified hearing loss, unspecified ear: H91.90

## 2018-05-24 HISTORY — DX: Anxiety disorder, unspecified: F41.9

## 2018-05-24 HISTORY — DX: Pure hypercholesterolemia, unspecified: E78.00

## 2018-05-24 HISTORY — DX: Dyspnea, unspecified: R06.00

## 2018-05-24 HISTORY — DX: Headache, unspecified: R51.9

## 2018-05-24 SURGERY — PHACOEMULSIFICATION, CATARACT, WITH IOL INSERTION
Anesthesia: Monitor Anesthesia Care | Site: Eye | Laterality: Right | Wound class: "Clean "

## 2018-05-24 MED ORDER — ONDANSETRON HCL 4 MG/2ML IJ SOLN: 4.0000 mg | Freq: Once | INTRAMUSCULAR | Status: DC | PRN

## 2018-05-24 MED ORDER — MIDAZOLAM HCL 2 MG/2ML IJ SOLN
INTRAMUSCULAR | Status: DC | PRN
  Administered 2018-05-24: 1.5 mg via INTRAVENOUS

## 2018-05-24 MED ORDER — LACTATED RINGERS IV SOLN: 10.0000 mL/h | INTRAVENOUS | Status: DC

## 2018-05-24 MED ORDER — FENTANYL CITRATE (PF) 100 MCG/2ML IJ SOLN
INTRAMUSCULAR | Status: DC | PRN
  Administered 2018-05-24: 50 ug via INTRAVENOUS

## 2018-05-24 MED ORDER — BSS IO SOLN
INTRAOCULAR | Status: DC | PRN
  Administered 2018-05-24: 96 mL via OPHTHALMIC

## 2018-05-24 MED ORDER — LIDOCAINE HCL (PF) 2 % IJ SOLN
INTRAOCULAR | Status: DC | PRN
  Administered 2018-05-24: 1 mL via INTRAOCULAR

## 2018-05-24 MED ORDER — SODIUM HYALURONATE 10 MG/ML IO SOLN
INTRAOCULAR | Status: DC | PRN
  Administered 2018-05-24: 0.55 mL via INTRAOCULAR

## 2018-05-24 MED ORDER — MOXIFLOXACIN HCL 0.5 % OP SOLN
OPHTHALMIC | Status: DC | PRN
  Administered 2018-05-24: 0.2 mL via OPHTHALMIC

## 2018-05-24 MED ORDER — SODIUM HYALURONATE 23 MG/ML IO SOLN
INTRAOCULAR | Status: DC | PRN
  Administered 2018-05-24: 0.6 mL via INTRAOCULAR

## 2018-05-24 MED ORDER — ARMC OPHTHALMIC DILATING DROPS
1.0000 "application " | OPHTHALMIC | Status: DC | PRN
  Administered 2018-05-24 (×3): 1 via OPHTHALMIC

## 2018-05-24 SURGICAL SUPPLY — 18 items
CANNULA ANT/CHMB 27G (MISCELLANEOUS) ×1 IMPLANT
CANNULA ANT/CHMB 27GA (MISCELLANEOUS) ×3 IMPLANT
DISSECTOR HYDRO NUCLEUS 50X22 (MISCELLANEOUS) ×3 IMPLANT
GLOVE BIO SURGEON STRL SZ8 (GLOVE) ×3 IMPLANT
GLOVE SURG LX 7.5 STRW (GLOVE) ×2
GLOVE SURG LX STRL 7.5 STRW (GLOVE) ×1 IMPLANT
GOWN STRL REUS W/ TWL LRG LVL3 (GOWN DISPOSABLE) ×2 IMPLANT
GOWN STRL REUS W/TWL LRG LVL3 (GOWN DISPOSABLE) ×4
LENS IOL ACRSF IQ ULTRA 24.0 (Intraocular Lens) IMPLANT
LENS IOL ACRYSOF IQ 24.0 (Intraocular Lens) ×3 IMPLANT
MARKER SKIN DUAL TIP RULER LAB (MISCELLANEOUS) ×3 IMPLANT
PACK CATARACT (MISCELLANEOUS) ×3 IMPLANT
PACK DR. KING ARMS (PACKS) ×3 IMPLANT
PACK EYE AFTER SURG (MISCELLANEOUS) ×3 IMPLANT
SYR 3ML LL SCALE MARK (SYRINGE) ×3 IMPLANT
SYR TB 1ML LUER SLIP (SYRINGE) ×3 IMPLANT
WATER STERILE IRR 500ML POUR (IV SOLUTION) ×3 IMPLANT
WIPE NON LINTING 3.25X3.25 (MISCELLANEOUS) ×3 IMPLANT

## 2018-05-24 NOTE — Anesthesia Preprocedure Evaluation (Signed)
Anesthesia Evaluation  Patient identified by MRN, date of birth, ID band Patient awake    Reviewed: Allergy & Precautions, NPO status , Patient's Chart, lab work & pertinent test results  History of Anesthesia Complications Negative for: history of anesthetic complications  Airway Mallampati: I  TM Distance: >3 FB Neck ROM: Full    Dental  (+) Upper Dentures Lower bridge:   Pulmonary sleep apnea and Continuous Positive Airway Pressure Ventilation , COPD, Current Smoker (1/2 ppd),    Pulmonary exam normal breath sounds clear to auscultation       Cardiovascular hypertension, + CAD and + Peripheral Vascular Disease (carotid stenosis)  Normal cardiovascular exam Rhythm:Regular Rate:Normal  CM, LBBB   Neuro/Psych  Headaches, PSYCHIATRIC DISORDERS Anxiety    GI/Hepatic hiatal hernia, GERD  ,  Endo/Other  negative endocrine ROS  Renal/GU negative Renal ROS     Musculoskeletal   Abdominal   Peds  Hematology  (+) Blood dyscrasia, anemia ,   Anesthesia Other Findings   Reproductive/Obstetrics                             Anesthesia Physical Anesthesia Plan  ASA: III  Anesthesia Plan: MAC   Post-op Pain Management:    Induction: Intravenous  PONV Risk Score and Plan: 0 and TIVA and Midazolam  Airway Management Planned: Natural Airway  Additional Equipment:   Intra-op Plan:   Post-operative Plan:   Informed Consent: I have reviewed the patients History and Physical, chart, labs and discussed the procedure including the risks, benefits and alternatives for the proposed anesthesia with the patient or authorized representative who has indicated his/her understanding and acceptance.     Plan Discussed with: CRNA  Anesthesia Plan Comments:         Anesthesia Quick Evaluation

## 2018-05-24 NOTE — H&P (Signed)
The History and Physical notes are on paper, have been signed, and are to be scanned.   I have examined the patient and there are no changes to the H&P.   Charles Blake 05/24/2018 9:06 AM

## 2018-05-24 NOTE — Anesthesia Postprocedure Evaluation (Signed)
Anesthesia Post Note  Patient: Charles Blake  Procedure(s) Performed: CATARACT EXTRACTION PHACO AND INTRAOCULAR LENS PLACEMENT (IOC) RIGHT (Right Eye)  Patient location during evaluation: PACU Anesthesia Type: MAC Level of consciousness: awake and alert, oriented and patient cooperative Pain management: pain level controlled Vital Signs Assessment: post-procedure vital signs reviewed and stable Respiratory status: spontaneous breathing, nonlabored ventilation and respiratory function stable Cardiovascular status: blood pressure returned to baseline and stable Postop Assessment: adequate PO intake Anesthetic complications: no    Darrin Nipper

## 2018-05-24 NOTE — Anesthesia Procedure Notes (Signed)
Procedure Name: MAC Performed by: Roylee Chaffin, CRNA Pre-anesthesia Checklist: Patient identified, Emergency Drugs available, Suction available, Timeout performed and Patient being monitored Patient Re-evaluated:Patient Re-evaluated prior to induction Oxygen Delivery Method: Nasal cannula Placement Confirmation: positive ETCO2       

## 2018-05-24 NOTE — Transfer of Care (Signed)
Immediate Anesthesia Transfer of Care Note  Patient: Charles Blake  Procedure(s) Performed: CATARACT EXTRACTION PHACO AND INTRAOCULAR LENS PLACEMENT (IOC) RIGHT (Right Eye)  Patient Location: PACU  Anesthesia Type: MAC  Level of Consciousness: awake, alert  and patient cooperative  Airway and Oxygen Therapy: Patient Spontanous Breathing and Patient connected to supplemental oxygen  Post-op Assessment: Post-op Vital signs reviewed, Patient's Cardiovascular Status Stable, Respiratory Function Stable, Patent Airway and No signs of Nausea or vomiting  Post-op Vital Signs: Reviewed and stable  Complications: No apparent anesthesia complications

## 2018-05-24 NOTE — Op Note (Signed)
OPERATIVE NOTE  Charles Blake [id number] 05/24/2018   PREOPERATIVE DIAGNOSIS:  Nuclear sclerotic cataract right eye.  H25.11   POSTOPERATIVE DIAGNOSIS:    Nuclear sclerotic cataract right eye.     PROCEDURE:  Phacoemusification with posterior chamber intraocular lens placement of the right eye   LENS:   Implant Name Type Inv. Item Serial No. Manufacturer Lot No. LRB No. Used  LENS IOL ACRYSOF IQ 24.0 - [id number] Intraocular Lens LENS IOL ACRYSOF IQ 24.0 [id number] ALCON  Right 1       [id number] +24.0   ULTRASOUND TIME: 0 minutes 49 seconds.  CDE 9.66   SURGEON:  Benay Pillow, MD, MPH  ANESTHESIOLOGIST: Anesthesiologist: Darrin Nipper, MD CRNA: Mayme Genta, CRNA   ANESTHESIA:  Topical with tetracaine drops augmented with 1% preservative-free intracameral lidocaine.  ESTIMATED BLOOD LOSS: less than 1 mL.   COMPLICATIONS:  None.   DESCRIPTION OF PROCEDURE:  The patient was identified in the holding room and transported to the operating room and placed in the supine position under the operating microscope.  The right eye was identified as the operative eye and it was prepped and draped in the usual sterile ophthalmic fashion.   A 1.0 millimeter clear-corneal paracentesis was made at the 10:30 position. 0.5 ml of preservative-free 1% lidocaine with epinephrine was injected into the anterior chamber.  The anterior chamber was filled with Healon 5 viscoelastic.  A 2.4 millimeter keratome was used to make a near-clear corneal incision at the 8:00 position.  A curvilinear capsulorrhexis was made with a cystotome and capsulorrhexis forceps.  Balanced salt solution was used to hydrodissect and hydrodelineate the nucleus.   Phacoemulsification was then used in stop and chop fashion to remove the lens nucleus and epinucleus.  The remaining cortex was then removed using the irrigation and aspiration handpiece. Healon was then placed into the capsular bag to distend it for lens  placement.  A lens was then injected into the capsular bag.  The remaining viscoelastic was aspirated.   Wounds were hydrated with balanced salt solution.  The anterior chamber was inflated to a physiologic pressure with balanced salt solution.   Intracameral vigamox 0.1 mL undiluted was injected into the eye and a drop placed onto the ocular surface.  No wound leaks were noted.  The patient was taken to the recovery room in stable condition without complications of anesthesia or surgery  Benay Pillow 05/24/2018, 9:44 AM

## 2018-05-25 ENCOUNTER — Encounter: Payer: Self-pay | Admitting: Ophthalmology

## 2018-06-21 DIAGNOSIS — E782 Mixed hyperlipidemia: Secondary | ICD-10-CM | POA: Diagnosis not present

## 2018-06-21 DIAGNOSIS — I739 Peripheral vascular disease, unspecified: Secondary | ICD-10-CM | POA: Diagnosis not present

## 2018-06-21 DIAGNOSIS — E119 Type 2 diabetes mellitus without complications: Secondary | ICD-10-CM | POA: Diagnosis not present

## 2018-06-21 DIAGNOSIS — I10 Essential (primary) hypertension: Secondary | ICD-10-CM | POA: Diagnosis not present

## 2018-07-06 ENCOUNTER — Ambulatory Visit (INDEPENDENT_AMBULATORY_CARE_PROVIDER_SITE_OTHER): Payer: BLUE CROSS/BLUE SHIELD

## 2018-07-06 ENCOUNTER — Encounter (INDEPENDENT_AMBULATORY_CARE_PROVIDER_SITE_OTHER): Payer: Self-pay | Admitting: Vascular Surgery

## 2018-07-06 ENCOUNTER — Encounter (INDEPENDENT_AMBULATORY_CARE_PROVIDER_SITE_OTHER): Payer: Self-pay

## 2018-07-06 ENCOUNTER — Ambulatory Visit (INDEPENDENT_AMBULATORY_CARE_PROVIDER_SITE_OTHER): Payer: BLUE CROSS/BLUE SHIELD | Admitting: Vascular Surgery

## 2018-07-06 VITALS — BP 146/75 | HR 68 | Resp 15 | Ht 68.0 in | Wt 225.0 lb

## 2018-07-06 DIAGNOSIS — E785 Hyperlipidemia, unspecified: Secondary | ICD-10-CM | POA: Diagnosis not present

## 2018-07-06 DIAGNOSIS — I739 Peripheral vascular disease, unspecified: Secondary | ICD-10-CM | POA: Diagnosis not present

## 2018-07-06 DIAGNOSIS — I10 Essential (primary) hypertension: Secondary | ICD-10-CM | POA: Diagnosis not present

## 2018-07-06 DIAGNOSIS — I6523 Occlusion and stenosis of bilateral carotid arteries: Secondary | ICD-10-CM | POA: Diagnosis not present

## 2018-07-06 NOTE — Progress Notes (Signed)
Subjective:    Patient ID: Charles Blake, male    DOB: 1956/05/06, 62 y.o.   MRN: [id number] Chief Complaint  Patient presents with  . Follow-up    1 year ABI, Aortic Iliac f/u   Patient presents for a yearly peripheral artery disease follow-up.  The patient was last seen on July 07, 2017.  The patient presents today without complaint.  The patient denies any claudication-like symptoms, rest pain or ulcer formation to the bilateral lower extremity.  Patient denies any edema to the bilateral legs.  The patient underwent an aortoiliac which was notable for biphasic blood flow through the aorta and iliacs.  Patent stents.  This is stable when compared to the previous aortoiliac duplex performed on July 07, 2017. The patient also underwent a bilateral ABI which is notable for triphasic tibials with normal great toe waveforms bilaterally.  Today's ABI 0.97 on the right and 1.03 on the left this is also stable when compared to the prior study on July 07, 2017.  Patient was last seen in February 2019 for his carotid artery disease.  He presents asymptomatically for this today as well.  Patient denies any fever, nausea vomiting.  Review of Systems  Constitutional: Negative.   HENT: Negative.   Eyes: Negative.   Respiratory: Negative.   Cardiovascular: Negative.   Gastrointestinal: Negative.   Endocrine: Negative.   Genitourinary: Negative.   Musculoskeletal: Negative.   Skin: Negative.   Allergic/Immunologic: Negative.   Neurological: Negative.   Hematological: Negative.   Psychiatric/Behavioral: Negative.       Objective:   Physical Exam  Constitutional: He is oriented to person, place, and time. He appears well-developed and well-nourished. No distress.  HENT:  Head: Normocephalic and atraumatic.  Right Ear: External ear normal.  Left Ear: External ear normal.  Eyes: Pupils are equal, round, and reactive to light. Conjunctivae are normal.  Neck: Normal range of motion.    Cardiovascular: Normal rate, regular rhythm, normal heart sounds and intact distal pulses.  Pulses:      Radial pulses are 2+ on the right side, and 2+ on the left side.       Dorsalis pedis pulses are 2+ on the right side, and 2+ on the left side.       Posterior tibial pulses are 2+ on the right side, and 2+ on the left side.  Pulmonary/Chest: Effort normal and breath sounds normal.  Musculoskeletal: Normal range of motion. He exhibits no edema.  Neurological: He is alert and oriented to person, place, and time.  Skin: Skin is warm and dry. He is not diaphoretic.  Psychiatric: He has a normal mood and affect. His behavior is normal. Judgment and thought content normal.  Vitals reviewed.  BP (!) 146/75 (BP Location: Right Arm, Patient Position: Sitting)   Pulse 68   Resp 15   Ht 5\' 8"  (1.727 m)   Wt 225 lb (102.1 kg)   BMI 34.21 kg/m   Past Medical History:  Diagnosis Date  . Anemia   . Anxiety   . Arthritis    fingers and hands  . Cancer ([place])    skin cancer  . COPD (chronic obstructive pulmonary disease) (HCC)    wheezing  . Coronary artery disease   . Dyspnea   . GERD (gastroesophageal reflux disease)   . Headache    2-3 times/ week  . History of hiatal hernia   . HOH (hard of hearing)   . Hypercholesteremia   .  Hypertension    controlled  . Pneumonia   . Sleep apnea    CPAP  . Wears dentures    upper   Social History   Socioeconomic History  . Marital status: Single    Spouse name: Not on file  . Number of children: Not on file  . Years of education: Not on file  . Highest education level: Not on file  Occupational History  . Not on file  Social Needs  . Financial resource strain: Not on file  . Food insecurity:    Worry: Not on file    Inability: Not on file  . Transportation needs:    Medical: Not on file    Non-medical: Not on file  Tobacco Use  . Smoking status: Current Every Day Smoker    Packs/day: 0.50    Years: 40.00    Pack years:  20.00    Types: Cigarettes  . Smokeless tobacco: Former Network engineer and Sexual Activity  . Alcohol use: Yes    Comment: rarely  . Drug use: Never  . Sexual activity: Not on file  Lifestyle  . Physical activity:    Days per week: Not on file    Minutes per session: Not on file  . Stress: Not on file  Relationships  . Social connections:    Talks on phone: Not on file    Gets together: Not on file    Attends religious service: Not on file    Active member of club or organization: Not on file    Attends meetings of clubs or organizations: Not on file    Relationship status: Not on file  . Intimate partner violence:    Fear of current or ex partner: Not on file    Emotionally abused: Not on file    Physically abused: Not on file    Forced sexual activity: Not on file  Other Topics Concern  . Not on file  Social History Narrative  . Not on file   Past Surgical History:  Procedure Laterality Date  . APPENDECTOMY    . CARDIAC CATHETERIZATION    . CAROTID ENDARTERECTOMY Left   . CATARACT EXTRACTION Left   . CATARACT EXTRACTION W/PHACO Right 05/24/2018   Procedure: CATARACT EXTRACTION PHACO AND INTRAOCULAR LENS PLACEMENT ([place]) RIGHT;  Surgeon: Eulogio Bear, MD;  Location: [place];  Service: Ophthalmology;  Laterality: Right;  CPAP, sleep apnea  . COLONOSCOPY N/A 05/29/2015   Procedure: COLONOSCOPY;  Surgeon: Lollie Sails, MD;  Location[facility] ENDOSCOPY;  Service: Endoscopy;  Laterality: N/A;  . iliac stents    . TONSILLECTOMY     History reviewed. No pertinent family history.  Allergies  Allergen Reactions  . Rosuvastatin     Other reaction(s): Other (See Comments) Extreme joint pain & fatigue       Assessment & Plan:  Patient presents for a yearly peripheral artery disease follow-up.  The patient was last seen on July 07, 2017.  The patient presents today without complaint.  The patient denies any claudication-like symptoms, rest pain or ulcer  formation to the bilateral lower extremity.  Patient denies any edema to the bilateral legs.  The patient underwent an aortoiliac which was notable for biphasic blood flow through the aorta and iliacs.  Patent stents.  This is stable when compared to the previous aortoiliac duplex performed on July 07, 2017. The patient also underwent a bilateral ABI which is notable for triphasic tibials with normal great toe  waveforms bilaterally.  Today's ABI 0.97 on the right and 1.03 on the left this is also stable when compared to the prior study on July 07, 2017.  Patient was last seen in February 2019 for his carotid artery disease.  He presents asymptomatically for this today as well.  Patient denies any fever, nausea vomiting.  1. PAD (peripheral artery disease) ([place]) - Stable Patient presents today asymptomatically. Physical exam is unremarkable. Aorta iliac duplex and ABI are stable when compared to the previous on July 07, 2017 At this point, there is no indication for vascular intervention. Patient is to follow-up in 1 year with a repeat ABI and aortoiliac to surveilled his peripheral artery disease. I have discussed with the patient at length the risk factors for and pathogenesis of atherosclerotic disease and encouraged a healthy diet, regular exercise regimen and blood pressure / glucose control.  The patient was encouraged to call the office in the interim if he experiences any claudication like symptoms, rest pain or ulcers to his feet / toes.  - VAS [place] ABI WITH/WO TBI; Future - VAS US AORTA/IVC/ILIACS; Future  2. Bilateral carotid artery stenosis - Stable Patient presents asymptomatically. Checked on a yearly basis. Patient is to follow-up in 1 year for carotid duplex  - VAS US CAROTID; Future  3. Hyperlipidemia, unspecified hyperlipidemia type - Stable Encouraged good control as its slows the progression of atherosclerotic disease  4. Essential hypertension - Stable Encouraged good  control as its slows the progression of atherosclerotic disease  Current Outpatient Medications on File Prior to Visit  Medication Sig Dispense Refill  . acetaminophen (TYLENOL) 650 MG CR tablet Take 650 mg by mouth every 8 (eight) hours as needed for pain.    Marland Kitchen albuterol (PROVENTIL HFA;VENTOLIN HFA) 108 (90 Base) MCG/ACT inhaler Inhale 2 puffs into the lungs every 4 (four) hours as needed for wheezing. 1 Inhaler 0  . amLODipine (NORVASC) 10 MG tablet Take 10 mg by mouth daily. am    . aspirin 81 MG tablet Take 81 mg by mouth daily. am    . carvedilol (COREG) 3.125 MG tablet Take 3.125 mg by mouth 2 (two) times daily with a meal. Am and pm    . clonazePAM (KLONOPIN) 0.5 MG tablet Take 0.5 mg by mouth as needed.     . cyclobenzaprine (FLEXERIL) 10 MG tablet Take 1 tablet (10 mg total) by mouth 3 (three) times daily as needed. 15 tablet 0  . Doxycycline Hyclate 200 MG TBEC   0  . escitalopram (LEXAPRO) 20 MG tablet Take 20 mg by mouth daily. Am    . fluconazole (DIFLUCAN) 200 MG tablet Take 200 mg by mouth once a week.     . hydrochlorothiazide (HYDRODIURIL) 25 MG tablet   6  . HYDROcodone-acetaminophen (NORCO/VICODIN) 5-325 MG tablet Take 1 tablet by mouth every 4 (four) hours as needed. 15 tablet 0  . ketorolac (TORADOL) 10 MG tablet Take 1 tablet (10 mg total) by mouth every 6 (six) hours as needed. 20 tablet 0  . losartan-hydrochlorothiazide (HYZAAR) 100-25 MG per tablet Take 1 tablet by mouth daily. am    . lovastatin (MEVACOR) 20 MG tablet Take 20 mg by mouth at bedtime.    . Melatonin 3 MG CAPS Take by mouth. pm    . Multiple Vitamin (MULTI-VITAMINS) TABS Take by mouth.    . Omega-3 Fatty Acids (FISH OIL) 1000 MG CAPS Take by mouth daily. am    . pantoprazole (PROTONIX) 40 MG tablet  Take 40 mg by mouth daily. am    . pravastatin (PRAVACHOL) 80 MG tablet Take 80 mg by mouth daily.    . tadalafil (CIALIS) 20 MG tablet Take 20 mg by mouth daily as needed for erectile dysfunction.    Marland Kitchen  telmisartan-hydrochlorothiazide (MICARDIS HCT) 80-12.5 MG tablet Take 1 tablet by mouth daily.  3  . tiotropium (SPIRIVA) 18 MCG inhalation capsule Place 18 mcg into inhaler and inhale daily. am    . atorvastatin (LIPITOR) 40 MG tablet Take 40 mg by mouth. am    . traMADol (ULTRAM) 50 MG tablet Take 1 tablet (50 mg total) by mouth every 6 (six) hours as needed for moderate pain. (Patient not taking: Reported on 07/06/2018) 12 tablet 0   No current facility-administered medications on file prior to visit.    There are no Patient Instructions on file for this visit. No follow-ups on file.  Dajsha Massaro A Dorinda Stehr, PA-C

## 2018-07-13 ENCOUNTER — Ambulatory Visit (INDEPENDENT_AMBULATORY_CARE_PROVIDER_SITE_OTHER): Payer: BLUE CROSS/BLUE SHIELD | Admitting: Vascular Surgery

## 2018-07-13 ENCOUNTER — Encounter (INDEPENDENT_AMBULATORY_CARE_PROVIDER_SITE_OTHER): Payer: BLUE CROSS/BLUE SHIELD

## 2018-07-14 DIAGNOSIS — I2510 Atherosclerotic heart disease of native coronary artery without angina pectoris: Secondary | ICD-10-CM | POA: Diagnosis not present

## 2018-07-14 DIAGNOSIS — G4733 Obstructive sleep apnea (adult) (pediatric): Secondary | ICD-10-CM | POA: Diagnosis not present

## 2018-07-14 DIAGNOSIS — I10 Essential (primary) hypertension: Secondary | ICD-10-CM | POA: Diagnosis not present

## 2018-09-08 DIAGNOSIS — I2510 Atherosclerotic heart disease of native coronary artery without angina pectoris: Secondary | ICD-10-CM | POA: Diagnosis not present

## 2018-09-08 DIAGNOSIS — E782 Mixed hyperlipidemia: Secondary | ICD-10-CM | POA: Diagnosis not present

## 2018-09-08 DIAGNOSIS — I420 Dilated cardiomyopathy: Secondary | ICD-10-CM | POA: Diagnosis not present

## 2018-09-08 DIAGNOSIS — I10 Essential (primary) hypertension: Secondary | ICD-10-CM | POA: Diagnosis not present

## 2018-09-21 DIAGNOSIS — Z23 Encounter for immunization: Secondary | ICD-10-CM | POA: Diagnosis not present

## 2018-09-30 DIAGNOSIS — L400 Psoriasis vulgaris: Secondary | ICD-10-CM | POA: Diagnosis not present

## 2018-09-30 DIAGNOSIS — Z1283 Encounter for screening for malignant neoplasm of skin: Secondary | ICD-10-CM | POA: Diagnosis not present

## 2018-09-30 DIAGNOSIS — L570 Actinic keratosis: Secondary | ICD-10-CM | POA: Diagnosis not present

## 2018-11-07 DIAGNOSIS — G4733 Obstructive sleep apnea (adult) (pediatric): Secondary | ICD-10-CM | POA: Diagnosis not present

## 2018-11-24 DIAGNOSIS — I2510 Atherosclerotic heart disease of native coronary artery without angina pectoris: Secondary | ICD-10-CM | POA: Diagnosis not present

## 2018-11-24 DIAGNOSIS — I447 Left bundle-branch block, unspecified: Secondary | ICD-10-CM | POA: Diagnosis not present

## 2018-11-24 DIAGNOSIS — I739 Peripheral vascular disease, unspecified: Secondary | ICD-10-CM | POA: Diagnosis not present

## 2018-11-24 DIAGNOSIS — G4733 Obstructive sleep apnea (adult) (pediatric): Secondary | ICD-10-CM | POA: Diagnosis not present

## 2018-12-08 DIAGNOSIS — G4733 Obstructive sleep apnea (adult) (pediatric): Secondary | ICD-10-CM | POA: Diagnosis not present

## 2018-12-22 DIAGNOSIS — L400 Psoriasis vulgaris: Secondary | ICD-10-CM | POA: Diagnosis not present

## 2018-12-22 DIAGNOSIS — B372 Candidiasis of skin and nail: Secondary | ICD-10-CM | POA: Diagnosis not present

## 2018-12-22 DIAGNOSIS — Z79899 Other long term (current) drug therapy: Secondary | ICD-10-CM | POA: Diagnosis not present

## 2018-12-22 DIAGNOSIS — L2089 Other atopic dermatitis: Secondary | ICD-10-CM | POA: Diagnosis not present

## 2018-12-29 DIAGNOSIS — E119 Type 2 diabetes mellitus without complications: Secondary | ICD-10-CM | POA: Diagnosis not present

## 2018-12-29 DIAGNOSIS — E782 Mixed hyperlipidemia: Secondary | ICD-10-CM | POA: Diagnosis not present

## 2018-12-29 DIAGNOSIS — I739 Peripheral vascular disease, unspecified: Secondary | ICD-10-CM | POA: Diagnosis not present

## 2018-12-29 DIAGNOSIS — I10 Essential (primary) hypertension: Secondary | ICD-10-CM | POA: Diagnosis not present

## 2019-01-08 DIAGNOSIS — G4733 Obstructive sleep apnea (adult) (pediatric): Secondary | ICD-10-CM | POA: Diagnosis not present

## 2019-02-06 DIAGNOSIS — G4733 Obstructive sleep apnea (adult) (pediatric): Secondary | ICD-10-CM | POA: Diagnosis not present

## 2019-03-09 DIAGNOSIS — G4733 Obstructive sleep apnea (adult) (pediatric): Secondary | ICD-10-CM | POA: Diagnosis not present

## 2019-04-04 DIAGNOSIS — Z85820 Personal history of malignant melanoma of skin: Secondary | ICD-10-CM | POA: Diagnosis not present

## 2019-04-04 DIAGNOSIS — L578 Other skin changes due to chronic exposure to nonionizing radiation: Secondary | ICD-10-CM | POA: Diagnosis not present

## 2019-04-04 DIAGNOSIS — Z872 Personal history of diseases of the skin and subcutaneous tissue: Secondary | ICD-10-CM | POA: Diagnosis not present

## 2019-04-04 DIAGNOSIS — L570 Actinic keratosis: Secondary | ICD-10-CM | POA: Diagnosis not present

## 2019-04-04 DIAGNOSIS — Z859 Personal history of malignant neoplasm, unspecified: Secondary | ICD-10-CM | POA: Diagnosis not present

## 2019-04-04 DIAGNOSIS — L400 Psoriasis vulgaris: Secondary | ICD-10-CM | POA: Diagnosis not present

## 2019-04-08 DIAGNOSIS — G4733 Obstructive sleep apnea (adult) (pediatric): Secondary | ICD-10-CM | POA: Diagnosis not present

## 2019-05-09 DIAGNOSIS — G4733 Obstructive sleep apnea (adult) (pediatric): Secondary | ICD-10-CM | POA: Diagnosis not present

## 2019-05-16 DIAGNOSIS — G4733 Obstructive sleep apnea (adult) (pediatric): Secondary | ICD-10-CM | POA: Diagnosis not present

## 2019-05-16 DIAGNOSIS — I10 Essential (primary) hypertension: Secondary | ICD-10-CM | POA: Diagnosis not present

## 2019-05-19 DIAGNOSIS — I2510 Atherosclerotic heart disease of native coronary artery without angina pectoris: Secondary | ICD-10-CM | POA: Diagnosis not present

## 2019-05-19 DIAGNOSIS — I208 Other forms of angina pectoris: Secondary | ICD-10-CM | POA: Diagnosis not present

## 2019-06-01 DIAGNOSIS — I208 Other forms of angina pectoris: Secondary | ICD-10-CM | POA: Diagnosis not present

## 2019-06-08 DIAGNOSIS — G4733 Obstructive sleep apnea (adult) (pediatric): Secondary | ICD-10-CM | POA: Diagnosis not present

## 2019-06-21 DIAGNOSIS — E782 Mixed hyperlipidemia: Secondary | ICD-10-CM | POA: Diagnosis not present

## 2019-06-21 DIAGNOSIS — G4733 Obstructive sleep apnea (adult) (pediatric): Secondary | ICD-10-CM | POA: Diagnosis not present

## 2019-06-21 DIAGNOSIS — I2510 Atherosclerotic heart disease of native coronary artery without angina pectoris: Secondary | ICD-10-CM | POA: Diagnosis not present

## 2019-06-21 DIAGNOSIS — I10 Essential (primary) hypertension: Secondary | ICD-10-CM | POA: Diagnosis not present

## 2019-07-05 DIAGNOSIS — E782 Mixed hyperlipidemia: Secondary | ICD-10-CM | POA: Diagnosis not present

## 2019-07-05 DIAGNOSIS — E119 Type 2 diabetes mellitus without complications: Secondary | ICD-10-CM | POA: Diagnosis not present

## 2019-07-05 DIAGNOSIS — I10 Essential (primary) hypertension: Secondary | ICD-10-CM | POA: Diagnosis not present

## 2019-07-05 DIAGNOSIS — I739 Peripheral vascular disease, unspecified: Secondary | ICD-10-CM | POA: Diagnosis not present

## 2019-07-08 ENCOUNTER — Ambulatory Visit (INDEPENDENT_AMBULATORY_CARE_PROVIDER_SITE_OTHER): Payer: BC Managed Care – PPO | Admitting: Vascular Surgery

## 2019-07-08 ENCOUNTER — Ambulatory Visit (INDEPENDENT_AMBULATORY_CARE_PROVIDER_SITE_OTHER): Payer: BC Managed Care – PPO

## 2019-07-08 ENCOUNTER — Other Ambulatory Visit: Payer: Self-pay

## 2019-07-08 ENCOUNTER — Encounter (INDEPENDENT_AMBULATORY_CARE_PROVIDER_SITE_OTHER): Payer: Self-pay | Admitting: Vascular Surgery

## 2019-07-08 VITALS — BP 113/67 | HR 76 | Resp 14 | Ht 67.0 in | Wt 225.0 lb

## 2019-07-08 DIAGNOSIS — I10 Essential (primary) hypertension: Secondary | ICD-10-CM | POA: Diagnosis not present

## 2019-07-08 DIAGNOSIS — I6523 Occlusion and stenosis of bilateral carotid arteries: Secondary | ICD-10-CM

## 2019-07-08 DIAGNOSIS — I739 Peripheral vascular disease, unspecified: Secondary | ICD-10-CM | POA: Diagnosis not present

## 2019-07-08 DIAGNOSIS — I700 Atherosclerosis of aorta: Secondary | ICD-10-CM

## 2019-07-08 DIAGNOSIS — E785 Hyperlipidemia, unspecified: Secondary | ICD-10-CM

## 2019-07-08 NOTE — Assessment & Plan Note (Signed)
Noninvasive studies today show normal ABIs of 1.0 bilaterally with mild dilatation of the distal aorta of about 2.6 cm.  He does have some elevated velocities in the right common iliac artery more so than the left.  At this point, with no symptoms and preserved ABIs we will not plan any and mention.  Continue to follow with noninvasive studies going forward.

## 2019-07-08 NOTE — Assessment & Plan Note (Signed)
Duplex today shows a patent left carotid endarterectomy without recurrent stenosis.  Right carotid stenosis is stable in the 40 to 59% range.  No role for intervention at that level.  Continue current medical regimen.  Recheck in 1 year

## 2019-07-08 NOTE — Progress Notes (Signed)
MRN : [id number]  Charles Blake is a 63 y.o. (1956-04-18) male who presents with chief complaint of  Chief Complaint  Patient presents with   Follow-up  .  History of Present Illness: Patient returns in follow-up of multiple vascular issues.  He is doing well today without specific complaints.  He had aortoiliac intervention over 5 years ago and had a left carotid endarterectomy about 5 years ago as well.  He currently has no lifestyle limiting claudication, ischemic rest pain, or ulceration.  He has no focal neurologic symptoms. Specifically, the patient denies amaurosis fugax, speech or swallowing difficulties, or arm or leg weakness or numbness.  Noninvasive studies today show normal ABIs of 1.0 bilaterally with mild dilatation of the distal aorta of about 2.6 cm.  He does have some elevated velocities in the right common iliac artery more so than the left. Duplex today shows a patent left carotid endarterectomy without recurrent stenosis.  Right carotid stenosis is stable in the 40 to 59% range.  Current Outpatient Medications  Medication Sig Dispense Refill   acetaminophen (TYLENOL) 650 MG CR tablet Take 650 mg by mouth every 8 (eight) hours as needed for pain.     albuterol (PROVENTIL HFA;VENTOLIN HFA) 108 (90 Base) MCG/ACT inhaler Inhale 2 puffs into the lungs every 4 (four) hours as needed for wheezing. 1 Inhaler 0   aspirin 81 MG tablet Take 81 mg by mouth daily. am     carvedilol (COREG) 3.125 MG tablet Take 3.125 mg by mouth 2 (two) times daily with a meal. Am and pm     clonazePAM (KLONOPIN) 0.5 MG tablet Take 0.5 mg by mouth as needed.      escitalopram (LEXAPRO) 20 MG tablet Take 20 mg by mouth daily. Am     lovastatin (MEVACOR) 20 MG tablet Take 20 mg by mouth at bedtime.     Melatonin 3 MG CAPS Take by mouth. pm     Multiple Vitamin (MULTI-VITAMINS) TABS Take by mouth.     Omega-3 Fatty Acids (FISH OIL) 1000 MG CAPS Take by mouth daily. am     pantoprazole  (PROTONIX) 40 MG tablet Take 40 mg by mouth daily. am     telmisartan-hydrochlorothiazide (MICARDIS HCT) 80-12.5 MG tablet Take 1 tablet by mouth daily.  3   tiotropium (SPIRIVA) 18 MCG inhalation capsule Place 18 mcg into inhaler and inhale daily. am     amLODipine (NORVASC) 10 MG tablet Take 10 mg by mouth daily. am     atorvastatin (LIPITOR) 40 MG tablet Take 40 mg by mouth. am     cyclobenzaprine (FLEXERIL) 10 MG tablet Take 1 tablet (10 mg total) by mouth 3 (three) times daily as needed. (Patient not taking: Reported on 07/08/2019) 15 tablet 0   Doxycycline Hyclate 200 MG TBEC   0   fluconazole (DIFLUCAN) 200 MG tablet Take 200 mg by mouth once a week.      hydrochlorothiazide (HYDRODIURIL) 25 MG tablet   6   HYDROcodone-acetaminophen (NORCO/VICODIN) 5-325 MG tablet Take 1 tablet by mouth every 4 (four) hours as needed. (Patient not taking: Reported on 07/08/2019) 15 tablet 0   ketorolac (TORADOL) 10 MG tablet Take 1 tablet (10 mg total) by mouth every 6 (six) hours as needed. (Patient not taking: Reported on 07/08/2019) 20 tablet 0   losartan-hydrochlorothiazide (HYZAAR) 100-25 MG per tablet Take 1 tablet by mouth daily. am     pravastatin (PRAVACHOL) 80 MG tablet Take 80 mg by mouth  daily.     tadalafil (CIALIS) 20 MG tablet Take 20 mg by mouth daily as needed for erectile dysfunction.     traMADol (ULTRAM) 50 MG tablet Take 1 tablet (50 mg total) by mouth every 6 (six) hours as needed for moderate pain. (Patient not taking: Reported on 07/06/2018) 12 tablet 0   No current facility-administered medications for this visit.     Past Medical History:  Diagnosis Date   Anemia    Anxiety    Arthritis    fingers and hands   Cancer (HCC)    skin cancer   COPD (chronic obstructive pulmonary disease) (HCC)    wheezing   Coronary artery disease    Dyspnea    GERD (gastroesophageal reflux disease)    Headache    2-3 times/ week   History of hiatal hernia    HOH  (hard of hearing)    Hypercholesteremia    Hypertension    controlled   Pneumonia    Sleep apnea    CPAP   Wears dentures    upper    Past Surgical History:  Procedure Laterality Date   APPENDECTOMY     CARDIAC CATHETERIZATION     CAROTID ENDARTERECTOMY Left    CATARACT EXTRACTION Left    CATARACT EXTRACTION W/PHACO Right 05/24/2018   Procedure: CATARACT EXTRACTION PHACO AND INTRAOCULAR LENS PLACEMENT ([place]) RIGHT;  Surgeon: Eulogio Bear, MD;  Location: [place];  Service: Ophthalmology;  Laterality: Right;  CPAP, sleep apnea   COLONOSCOPY N/A 05/29/2015   Procedure: COLONOSCOPY;  Surgeon: Lollie Sails, MD;  Location: [facility] ENDOSCOPY;  Service: Endoscopy;  Laterality: N/A;   iliac stents     TONSILLECTOMY      Social History  Substance Use Topics   Smoking status: Current Every Day Smoker    Types: Cigarettes   Smokeless tobacco: Never Used   Alcohol use No  No IV drug use  Family History No bleeding or clotting disorders,no aneurysms, no autoimmune diseases, no porphyria  No Known Allergies   REVIEW OF SYSTEMS(Negative unless checked)  Constitutional: [] ?Weight loss[] ?Fever[] ?Chills Cardiac:[] ?Chest pain[] ?Chest pressure[] ?Palpitations [] ?Shortness of breath when laying flat [] ?Shortness of breath at rest [] ?Shortness of breath with exertion. Vascular: [] ?Pain in legs with walking[] ?Pain in legsat rest[] ?Pain in legs when laying flat [] ?Claudication [] ?Pain in feet when walking [] ?Pain in feet at rest [] ?Pain in feet when laying flat [] ?History of DVT [] ?Phlebitis [] ?Swelling in legs [] ?Varicose veins [] ?Non-healing ulcers Pulmonary: [] ?Uses home oxygen [] ?Productive cough[] ?Hemoptysis [] ?Wheeze [] ?COPD [] ?Asthma Neurologic: [] ?Dizziness [] ?Blackouts [] ?Seizures [] ?History of stroke [] ?History of TIA[] ?Aphasia [] ?Temporary blindness[] ?Dysphagia [] ?Weaknessor  numbness in arms [] ?Weakness or numbnessin legs Musculoskeletal: [x] ?Arthritis [] ?Joint swelling [] ?Joint pain [] ?Low back pain Hematologic:[] ?Easy bruising[] ?Easy bleeding [] ?Hypercoagulable state [] ?Anemic  Gastrointestinal:[] ?Blood in stool[] ?Vomiting blood[x] ?Gastroesophageal reflux/heartburn[] ?Abdominal pain Genitourinary: [] ?Chronic kidney disease [] ?Difficulturination [] ?Frequenturination [] ?Burning with urination[] ?Hematuria Skin: [] ?Rashes [] ?Ulcers [] ?Wounds Psychological: [] ?History of anxiety[] ?History of major depression.    Physical Examination  Vitals:   07/08/19 1001  BP: 113/67  Pulse: 76  Resp: 14  Weight: 225 lb (102.1 kg)  Height: 5\' 7"  (1.702 m)   Body mass index is 35.24 kg/m. Gen:  WD/WN, NAD Head: [place]/AT, No temporalis wasting. Ear/Nose/Throat: Hearing grossly intact, nares w/o erythema or drainage, trachea midline Eyes: Conjunctiva clear. Sclera non-icteric Neck: Supple.  No bruit  Pulmonary:  Good air movement, equal and clear to auscultation bilaterally.  Cardiac: RRR, No JVD Vascular:  Vessel Right Left  Radial Palpable Palpable  PT Palpable Palpable  DP Palpable Palpable    Musculoskeletal: M/S 5/5 throughout.  No deformity or atrophy. No edema. Neurologic: CN 2-12 intact. Sensation grossly intact in extremities.  Symmetrical.  Speech is fluent. Motor exam as listed above. Psychiatric: Judgment intact, Mood & affect appropriate for pt's clinical situation. Dermatologic: No rashes or ulcers noted.  No cellulitis or open wounds.      CBC Lab Results  Component Value Date   WBC 20.2 (H) 03/30/2017   HGB 16.3 03/30/2017   HCT 46.6 03/30/2017   MCV 91.3 03/30/2017   PLT 312 03/30/2017    BMET    Component Value Date/Time   NA 133 (L) 03/30/2017 0914   NA 139 03/02/2014 0430   K 3.2 (L) 03/30/2017 0914   K 3.9 03/02/2014 0430   CL 97 (L) 03/30/2017 0914     CL 106 03/02/2014 0430   CO2 27 03/30/2017 0914   CO2 29 03/02/2014 0430   GLUCOSE 126 (H) 03/30/2017 0914   GLUCOSE 123 (H) 03/02/2014 0430   BUN 12 03/30/2017 0914   BUN 13 03/02/2014 0430   CREATININE 0.87 03/30/2017 0914   CREATININE 0.96 03/02/2014 0430   CALCIUM 8.6 (L) 03/30/2017 0914   CALCIUM 7.6 (L) 03/02/2014 0430   GFRNONAA >60 03/30/2017 0914   GFRNONAA >60 03/02/2014 0430   GFRAA >60 03/30/2017 0914   GFRAA >60 03/02/2014 0430   CrCl cannot be calculated (Patient's most recent lab result is older than the maximum 21 days allowed.).  COAG Lab Results  Component Value Date   INR 1.1 03/02/2014    Radiology No results found.   Assessment/Plan Hypertension blood pressure control important in reducing the progression of atherosclerotic disease. On appropriate oral medications.   Aortic atherosclerosis ([place]) Status post aortoiliac intervention several years ago and doing well.  Hyperlipidemia lipid control important in reducing the progression of atherosclerotic disease. Continue statin therapy   PAD (peripheral artery disease) (HCC) Noninvasive studies today show normal ABIs of 1.0 bilaterally with mild dilatation of the distal aorta of about 2.6 cm.  He does have some elevated velocities in the right common iliac artery more so than the left.  At this point, with no symptoms and preserved ABIs we will not plan any and mention.  Continue to follow with noninvasive studies going forward.  Carotid artery stenosis Duplex today shows a patent left carotid endarterectomy without recurrent stenosis.  Right carotid stenosis is stable in the 40 to 59% range.  No role for intervention at that level.  Continue current medical regimen.  Recheck in 1 year    Leotis Pain, MD  07/08/2019 10:55 AM    This note was created with Dragon medical transcription system.  Any errors from dictation are purely unintentional

## 2019-07-08 NOTE — Assessment & Plan Note (Signed)
lipid control important in reducing the progression of atherosclerotic disease. Continue statin therapy  

## 2019-07-09 DIAGNOSIS — G4733 Obstructive sleep apnea (adult) (pediatric): Secondary | ICD-10-CM | POA: Diagnosis not present

## 2019-07-12 DIAGNOSIS — H906 Mixed conductive and sensorineural hearing loss, bilateral: Secondary | ICD-10-CM | POA: Diagnosis not present

## 2019-07-12 DIAGNOSIS — H903 Sensorineural hearing loss, bilateral: Secondary | ICD-10-CM | POA: Diagnosis not present

## 2019-07-12 DIAGNOSIS — H6980 Other specified disorders of Eustachian tube, unspecified ear: Secondary | ICD-10-CM | POA: Diagnosis not present

## 2019-07-12 DIAGNOSIS — J301 Allergic rhinitis due to pollen: Secondary | ICD-10-CM | POA: Diagnosis not present

## 2019-07-25 DIAGNOSIS — G4733 Obstructive sleep apnea (adult) (pediatric): Secondary | ICD-10-CM | POA: Diagnosis not present

## 2019-07-25 DIAGNOSIS — E669 Obesity, unspecified: Secondary | ICD-10-CM | POA: Diagnosis not present

## 2019-07-25 DIAGNOSIS — I10 Essential (primary) hypertension: Secondary | ICD-10-CM | POA: Diagnosis not present

## 2019-07-26 DIAGNOSIS — H903 Sensorineural hearing loss, bilateral: Secondary | ICD-10-CM | POA: Diagnosis not present

## 2019-07-26 DIAGNOSIS — H6980 Other specified disorders of Eustachian tube, unspecified ear: Secondary | ICD-10-CM | POA: Diagnosis not present

## 2019-07-26 DIAGNOSIS — J301 Allergic rhinitis due to pollen: Secondary | ICD-10-CM | POA: Diagnosis not present

## 2019-07-26 DIAGNOSIS — H60549 Acute eczematoid otitis externa, unspecified ear: Secondary | ICD-10-CM | POA: Diagnosis not present

## 2019-07-26 DIAGNOSIS — H6983 Other specified disorders of Eustachian tube, bilateral: Secondary | ICD-10-CM | POA: Diagnosis not present

## 2019-08-09 DIAGNOSIS — G4733 Obstructive sleep apnea (adult) (pediatric): Secondary | ICD-10-CM | POA: Diagnosis not present

## 2019-08-16 DIAGNOSIS — K219 Gastro-esophageal reflux disease without esophagitis: Secondary | ICD-10-CM | POA: Diagnosis not present

## 2019-08-16 DIAGNOSIS — H6122 Impacted cerumen, left ear: Secondary | ICD-10-CM | POA: Diagnosis not present

## 2019-08-16 DIAGNOSIS — H903 Sensorineural hearing loss, bilateral: Secondary | ICD-10-CM | POA: Diagnosis not present

## 2019-08-16 DIAGNOSIS — H6060 Unspecified chronic otitis externa, unspecified ear: Secondary | ICD-10-CM | POA: Diagnosis not present

## 2019-08-16 DIAGNOSIS — H6980 Other specified disorders of Eustachian tube, unspecified ear: Secondary | ICD-10-CM | POA: Diagnosis not present

## 2019-09-08 DIAGNOSIS — G4733 Obstructive sleep apnea (adult) (pediatric): Secondary | ICD-10-CM | POA: Diagnosis not present

## 2019-10-04 DIAGNOSIS — B353 Tinea pedis: Secondary | ICD-10-CM | POA: Diagnosis not present

## 2019-10-04 DIAGNOSIS — Z79899 Other long term (current) drug therapy: Secondary | ICD-10-CM | POA: Diagnosis not present

## 2019-10-04 DIAGNOSIS — L218 Other seborrheic dermatitis: Secondary | ICD-10-CM | POA: Diagnosis not present

## 2019-10-04 DIAGNOSIS — L400 Psoriasis vulgaris: Secondary | ICD-10-CM | POA: Diagnosis not present

## 2019-10-09 DIAGNOSIS — G4733 Obstructive sleep apnea (adult) (pediatric): Secondary | ICD-10-CM | POA: Diagnosis not present

## 2019-10-19 DIAGNOSIS — I420 Dilated cardiomyopathy: Secondary | ICD-10-CM | POA: Diagnosis not present

## 2019-10-19 DIAGNOSIS — I25118 Atherosclerotic heart disease of native coronary artery with other forms of angina pectoris: Secondary | ICD-10-CM | POA: Diagnosis not present

## 2019-10-19 DIAGNOSIS — E782 Mixed hyperlipidemia: Secondary | ICD-10-CM | POA: Diagnosis not present

## 2019-10-19 DIAGNOSIS — I739 Peripheral vascular disease, unspecified: Secondary | ICD-10-CM | POA: Diagnosis not present

## 2019-10-20 DIAGNOSIS — M25512 Pain in left shoulder: Secondary | ICD-10-CM | POA: Diagnosis not present

## 2019-11-08 DIAGNOSIS — G4733 Obstructive sleep apnea (adult) (pediatric): Secondary | ICD-10-CM | POA: Diagnosis not present

## 2019-11-11 DIAGNOSIS — M25512 Pain in left shoulder: Secondary | ICD-10-CM | POA: Diagnosis not present

## 2019-11-15 DIAGNOSIS — M7542 Impingement syndrome of left shoulder: Secondary | ICD-10-CM | POA: Diagnosis not present

## 2019-11-15 DIAGNOSIS — M25512 Pain in left shoulder: Secondary | ICD-10-CM | POA: Diagnosis not present

## 2019-11-24 DIAGNOSIS — M19072 Primary osteoarthritis, left ankle and foot: Secondary | ICD-10-CM | POA: Diagnosis not present

## 2019-11-24 DIAGNOSIS — L02612 Cutaneous abscess of left foot: Secondary | ICD-10-CM | POA: Diagnosis not present

## 2019-11-24 DIAGNOSIS — L409 Psoriasis, unspecified: Secondary | ICD-10-CM | POA: Diagnosis not present

## 2019-11-24 DIAGNOSIS — M7989 Other specified soft tissue disorders: Secondary | ICD-10-CM | POA: Diagnosis not present

## 2019-11-24 DIAGNOSIS — L02416 Cutaneous abscess of left lower limb: Secondary | ICD-10-CM | POA: Diagnosis not present

## 2019-11-24 DIAGNOSIS — F17210 Nicotine dependence, cigarettes, uncomplicated: Secondary | ICD-10-CM | POA: Diagnosis not present

## 2019-11-24 DIAGNOSIS — L03116 Cellulitis of left lower limb: Secondary | ICD-10-CM | POA: Diagnosis not present

## 2019-11-24 DIAGNOSIS — R238 Other skin changes: Secondary | ICD-10-CM | POA: Diagnosis not present

## 2019-11-24 DIAGNOSIS — I119 Hypertensive heart disease without heart failure: Secondary | ICD-10-CM | POA: Diagnosis not present

## 2019-11-24 DIAGNOSIS — I2510 Atherosclerotic heart disease of native coronary artery without angina pectoris: Secondary | ICD-10-CM | POA: Diagnosis not present

## 2019-11-24 DIAGNOSIS — J449 Chronic obstructive pulmonary disease, unspecified: Secondary | ICD-10-CM | POA: Diagnosis not present

## 2019-11-24 DIAGNOSIS — E785 Hyperlipidemia, unspecified: Secondary | ICD-10-CM | POA: Diagnosis not present

## 2019-11-24 DIAGNOSIS — M79672 Pain in left foot: Secondary | ICD-10-CM | POA: Diagnosis not present

## 2020-07-10 ENCOUNTER — Other Ambulatory Visit: Payer: Self-pay

## 2020-07-10 ENCOUNTER — Ambulatory Visit (INDEPENDENT_AMBULATORY_CARE_PROVIDER_SITE_OTHER): Payer: Medicare Other

## 2020-07-10 ENCOUNTER — Ambulatory Visit (INDEPENDENT_AMBULATORY_CARE_PROVIDER_SITE_OTHER): Payer: Medicare Other | Admitting: Nurse Practitioner

## 2020-07-10 ENCOUNTER — Encounter (INDEPENDENT_AMBULATORY_CARE_PROVIDER_SITE_OTHER): Payer: Self-pay | Admitting: Nurse Practitioner

## 2020-07-10 VITALS — BP 154/76 | HR 80 | Resp 16 | Wt 234.0 lb

## 2020-07-10 DIAGNOSIS — I739 Peripheral vascular disease, unspecified: Secondary | ICD-10-CM | POA: Diagnosis not present

## 2020-07-10 DIAGNOSIS — I10 Essential (primary) hypertension: Secondary | ICD-10-CM

## 2020-07-10 DIAGNOSIS — I700 Atherosclerosis of aorta: Secondary | ICD-10-CM

## 2020-07-10 DIAGNOSIS — I6523 Occlusion and stenosis of bilateral carotid arteries: Secondary | ICD-10-CM

## 2020-07-10 DIAGNOSIS — E785 Hyperlipidemia, unspecified: Secondary | ICD-10-CM

## 2020-07-10 NOTE — Progress Notes (Signed)
Subjective:    Patient ID: Charles Blake, male    DOB: 12-09-1955, 63 y.o.   MRN: [id number] Chief Complaint  Patient presents with  . Follow-up    ultrasound follow up    The patient returns to the office for followup and review of the noninvasive studies. There have been no interval changes in lower extremity symptoms. No interval shortening of the patient's claudication distance or development of rest pain symptoms. No new ulcers or wounds have occurred since the last visit.The patient is seen for follow up evaluation of carotid stenosis. The carotid stenosis followed by ultrasound.   Patient has had carotid surgery within the last year.  The patient denies amaurosis fugax or recent TIA symptoms. There are no recent neurological changes noted. The patient denies history of DVT, PE or superficial thrombophlebitis. The patient denies recent episodes of angina or shortness of breath.   The patient is taking enteric-coated aspirin 81 mg daily.  There is no history of migraine headaches. There is no history of seizures.  The patient has a history of coronary artery disease, no recent episodes of angina or shortness of breath.  There is a history of hyperlipidemia which is being treated with a statin.    ABI Rt=1.03 and Lt=1.10  (previous ABI's Rt=1.05 and Lt=1.02) Duplex ultrasound of the bilateral tibial arteries reveals triphasic waveforms with good toe waveforms.  The patient also underwent an aortoiliac duplex which shows no evidence of an abdominal aortic aneurysm.  The largest aortic measurement was 1.9 cm.  There is some moderate atherosclerosis throughout however velocities do not indicate a greater than 50% stenosis.  The previously placed stents are patent no evidence of significant stenosis.    Carotid Duplex done today shows 40 to 59% stenosis of the right internal carotid artery with 80 to 99% stenosis of the left internal carotid artery.  This is a significant elevation  from previous studies done last year.  On 07/08/2019 in the left internal carotid artery velocities were consistent with a 1 to 39% stenosis.  This increase in velocities suggest restenosis of the previous left carotid endarterectomy.   Review of Systems  Musculoskeletal: Positive for myalgias.  All other systems reviewed and are negative.      Objective:   Physical Exam Vitals reviewed.  HENT:     Head: Normocephalic.  Neck:     Vascular: No carotid bruit.  Cardiovascular:     Rate and Rhythm: Normal rate and regular rhythm.     Pulses: Normal pulses.     Heart sounds: Normal heart sounds.  Pulmonary:     Effort: Pulmonary effort is normal.  Musculoskeletal:        General: Normal range of motion.  Skin:    General: Skin is warm.     Capillary Refill: Capillary refill takes less than 2 seconds.  Neurological:     Mental Status: He is alert and oriented to person, place, and time.     Motor: Weakness present.  Psychiatric:        Mood and Affect: Mood normal.        Behavior: Behavior normal.        Thought Content: Thought content normal.        Judgment: Judgment normal.     BP (!) 154/76 (BP Location: Right Arm)   Pulse 80   Resp 16   Wt 234 lb (106.1 kg)   BMI 36.65 kg/m   Past Medical History:  Diagnosis  Date  . Anemia   . Anxiety   . Arthritis    fingers and hands  . Cancer ([place])    skin cancer  . COPD (chronic obstructive pulmonary disease) (HCC)    wheezing  . Coronary artery disease   . Dyspnea   . GERD (gastroesophageal reflux disease)   . Headache    2-3 times/ week  . History of hiatal hernia   . HOH (hard of hearing)   . Hypercholesteremia   . Hypertension    controlled  . Pneumonia   . Sleep apnea    CPAP  . Wears dentures    upper    Social History   Socioeconomic History  . Marital status: Single    Spouse name: Not on file  . Number of children: Not on file  . Years of education: Not on file  . Highest education level: Not  on file  Occupational History  . Not on file  Tobacco Use  . Smoking status: Current Every Day Smoker    Packs/day: 0.50    Years: 40.00    Pack years: 20.00    Types: Cigarettes  . Smokeless tobacco: Former Network engineer and Sexual Activity  . Alcohol use: Yes    Comment: rarely  . Drug use: Never  . Sexual activity: Not on file  Other Topics Concern  . Not on file  Social History Narrative  . Not on file   Social Determinants of Health   Financial Resource Strain:   . Difficulty of Paying Living Expenses:   Food Insecurity:   . Worried About Charity fundraiser in the Last Year:   . Arboriculturist in the Last Year:   Transportation Needs:   . Film/video editor (Medical):   Marland Kitchen Lack of Transportation (Non-Medical):   Physical Activity:   . Days of Exercise per Week:   . Minutes of Exercise per Session:   Stress:   . Feeling of Stress :   Social Connections:   . Frequency of Communication with Friends and Family:   . Frequency of Social Gatherings with Friends and Family:   . Attends Religious Services:   . Active Member of Clubs or Organizations:   . Attends Archivist Meetings:   Marland Kitchen Marital Status:   Intimate Partner Violence:   . Fear of Current or Ex-Partner:   . Emotionally Abused:   Marland Kitchen Physically Abused:   . Sexually Abused:     Past Surgical History:  Procedure Laterality Date  . APPENDECTOMY    . CARDIAC CATHETERIZATION    . CAROTID ENDARTERECTOMY Left   . CATARACT EXTRACTION Left   . CATARACT EXTRACTION W/PHACO Right 05/24/2018   Procedure: CATARACT EXTRACTION PHACO AND INTRAOCULAR LENS PLACEMENT ([place]) RIGHT;  Surgeon: Eulogio Bear, MD;  Location: [place];  Service: Ophthalmology;  Laterality: Right;  CPAP, sleep apnea  . COLONOSCOPY N/A 05/29/2015   Procedure: COLONOSCOPY;  Surgeon: Lollie Sails, MD;  Location: [facility] ENDOSCOPY;  Service: Endoscopy;  Laterality: N/A;  . iliac stents    . TONSILLECTOMY       Family History  Problem Relation Age of Onset  . Heart attack Mother   . Hypertension Mother   . Kidney disease Mother   . Heart attack Father   . Hypertension Father   . Colon cancer Father   . Heart attack Brother     Allergies  Allergen Reactions  . Rosuvastatin  Other reaction(s): Other (See Comments) Extreme joint pain & fatigue        Assessment & Plan:   1. Bilateral carotid artery stenosis The patient remains asymptomatic with respect to the carotid stenosis.  However, the patient has now progressed and has a lesion the is >70%.  Patient should undergo CT angiography of the carotid arteries to define the degree of stenosis of the internal carotid arteries bilaterally and the anatomic suitability for surgery vs. intervention.  If the patient does indeed need surgery cardiac clearance will be required, once cleared the patient will be scheduled for surgery.  The risks, benefits and alternative therapies were reviewed in detail with the patient.  All questions were answered.  The patient agrees to proceed with imaging.  Continue antiplatelet therapy as prescribed. Continue management of CAD, HTN and Hyperlipidemia. Healthy heart diet, encouraged exercise at least 4 times per week.   - CT ANGIO HEAD W OR WO CONTRAST; Future - CT ANGIO NECK W OR WO CONTRAST; Future  2. PAD (peripheral artery disease) (HCC)  Recommend:  The patient has evidence of atherosclerosis of the lower extremities with claudication.  The patient does not voice lifestyle limiting changes at this point in time.  Noninvasive studies do not suggest clinically significant change.  No invasive studies, angiography or surgery at this time The patient should continue walking and begin a more formal exercise program.  The patient should continue antiplatelet therapy and aggressive treatment of the lipid abnormalities  No changes in the patient's medications at this time  The patient should  continue wearing graduated compression socks 10-15 mmHg strength to control the mild edema.   Patient will follow up in 1 year with noninvasive studies as ordered - VAS [place] ABI WITH/WO TBI; Future - VAS US AORTA/IVC/ILIACS; Future  3. Essential hypertension Continue antihypertensive medications as already ordered, these medications have been reviewed and there are no changes at this time.   4. Hyperlipidemia, unspecified hyperlipidemia type Continue statin as ordered and reviewed, no changes at this time    Current Outpatient Medications on File Prior to Visit  Medication Sig Dispense Refill  . acetaminophen (TYLENOL) 650 MG CR tablet Take 650 mg by mouth every 8 (eight) hours as needed for pain.    Marland Kitchen albuterol (PROVENTIL HFA;VENTOLIN HFA) 108 (90 Base) MCG/ACT inhaler Inhale 2 puffs into the lungs every 4 (four) hours as needed for wheezing. 1 Inhaler 0  . aspirin 81 MG tablet Take 81 mg by mouth daily. am    . carvedilol (COREG) 3.125 MG tablet Take 3.125 mg by mouth 2 (two) times daily with a meal. Am and pm    . clonazePAM (KLONOPIN) 0.5 MG tablet Take 0.5 mg by mouth as needed.     Marland Kitchen escitalopram (LEXAPRO) 20 MG tablet Take 20 mg by mouth daily. Am    . fluconazole (DIFLUCAN) 200 MG tablet Take 200 mg by mouth once a week.     . fluticasone (FLONASE) 50 MCG/ACT nasal spray Place into the nose.    . furosemide (LASIX) 20 MG tablet Take by mouth.    . isosorbide mononitrate (IMDUR) 60 MG 24 hr tablet Take by mouth.    Marland Kitchen ketoconazole (NIZORAL) 2 % shampoo APPLY TOPICALLY DIRECTLY TO SCALP EVERY OTHER DAY ALLOW TO SIT FOR 30 60 MINUTES BEFORE RINSING    . lovastatin (MEVACOR) 20 MG tablet Take 20 mg by mouth at bedtime.    . Melatonin 3 MG CAPS Take by mouth. pm    .  Multiple Vitamin (MULTI-VITAMINS) TABS Take by mouth.    . nitroGLYCERIN (NITROSTAT) 0.4 MG SL tablet SMARTSIG:1 Tablet(s) Sublingual PRN    . Omega-3 Fatty Acids (FISH OIL) 1000 MG CAPS Take by mouth daily. am    .  pantoprazole (PROTONIX) 40 MG tablet Take 40 mg by mouth daily. am    . pravastatin (PRAVACHOL) 80 MG tablet Take 80 mg by mouth daily.    . tadalafil (CIALIS) 20 MG tablet Take 20 mg by mouth daily as needed for erectile dysfunction.    Marland Kitchen telmisartan-hydrochlorothiazide (MICARDIS HCT) 80-12.5 MG tablet Take 1 tablet by mouth daily.  3  . tiotropium (SPIRIVA) 18 MCG inhalation capsule Place 18 mcg into inhaler and inhale daily. am    . traMADol (ULTRAM) 50 MG tablet Take 1 tablet (50 mg total) by mouth every 6 (six) hours as needed for moderate pain. 12 tablet 0  . amLODipine (NORVASC) 10 MG tablet Take 10 mg by mouth daily. am (Patient not taking: Reported on 07/10/2020)    . atorvastatin (LIPITOR) 40 MG tablet Take 40 mg by mouth. am (Patient not taking: Reported on 07/10/2020)    . cyclobenzaprine (FLEXERIL) 10 MG tablet Take 1 tablet (10 mg total) by mouth 3 (three) times daily as needed. (Patient not taking: Reported on 07/08/2019) 15 tablet 0  . Doxycycline Hyclate 200 MG TBEC  (Patient not taking: Reported on 07/10/2020)  0  . hydrochlorothiazide (HYDRODIURIL) 25 MG tablet  (Patient not taking: Reported on 07/10/2020)  6  . HYDROcodone-acetaminophen (NORCO/VICODIN) 5-325 MG tablet Take 1 tablet by mouth every 4 (four) hours as needed. (Patient not taking: Reported on 07/08/2019) 15 tablet 0  . ketorolac (TORADOL) 10 MG tablet Take 1 tablet (10 mg total) by mouth every 6 (six) hours as needed. (Patient not taking: Reported on 07/08/2019) 20 tablet 0  . losartan-hydrochlorothiazide (HYZAAR) 100-25 MG per tablet Take 1 tablet by mouth daily. am (Patient not taking: Reported on 07/10/2020)     No current facility-administered medications on file prior to visit.    There are no Patient Instructions on file for this visit. No follow-ups on file.   Kris Hartmann, NP

## 2020-07-13 ENCOUNTER — Other Ambulatory Visit (INDEPENDENT_AMBULATORY_CARE_PROVIDER_SITE_OTHER): Payer: Self-pay | Admitting: Nurse Practitioner

## 2020-07-13 DIAGNOSIS — I6523 Occlusion and stenosis of bilateral carotid arteries: Secondary | ICD-10-CM

## 2020-07-18 ENCOUNTER — Ambulatory Visit
Admission: RE | Admit: 2020-07-18 | Discharge: 2020-07-18 | Disposition: A | Discharge status: Home / Self Care | Payer: Medicare Other | Source: Ambulatory Visit | Attending: Nurse Practitioner | Admitting: Nurse Practitioner

## 2020-07-18 ENCOUNTER — Other Ambulatory Visit
Admission: RE | Admit: 2020-07-18 | Discharge: 2020-07-18 | Disposition: A | Discharge status: Home / Self Care | Payer: Medicare Other | Source: Home / Self Care | Attending: Nurse Practitioner | Admitting: Nurse Practitioner

## 2020-07-18 ENCOUNTER — Other Ambulatory Visit: Payer: Self-pay

## 2020-07-18 DIAGNOSIS — I6523 Occlusion and stenosis of bilateral carotid arteries: Secondary | ICD-10-CM | POA: Insufficient documentation

## 2020-07-18 LAB — CREATININE, SERUM
Creatinine, Ser: 1.05 mg/dL (ref 0.61–1.24)
GFR calc Af Amer: >60 mL/min (ref >60)
GFR calc non Af Amer: >60 mL/min (ref >60)

## 2020-07-18 LAB — BUN: BUN: 19 mg/dL (ref 8–23)

## 2020-07-18 MED ORDER — IOHEXOL 350 MG/ML SOLN
100.0000 mL | Freq: Once | INTRAVENOUS | Status: AC | PRN
  Administered 2020-07-18: 100 mL via INTRAVENOUS

## 2020-07-18 MED ORDER — IOHEXOL 350 MG/ML SOLN
75.0000 mL | Freq: Once | INTRAVENOUS | Status: AC | PRN
  Administered 2020-07-18: 75 mL via INTRAVENOUS

## 2020-07-23 ENCOUNTER — Encounter: Payer: Self-pay | Admitting: Hospice and Palliative Medicine

## 2020-07-23 ENCOUNTER — Other Ambulatory Visit (INDEPENDENT_AMBULATORY_CARE_PROVIDER_SITE_OTHER): Payer: Medicare Other | Admitting: Hospice and Palliative Medicine

## 2020-07-23 DIAGNOSIS — I10 Essential (primary) hypertension: Secondary | ICD-10-CM | POA: Diagnosis not present

## 2020-07-23 DIAGNOSIS — G4733 Obstructive sleep apnea (adult) (pediatric): Secondary | ICD-10-CM

## 2020-07-23 DIAGNOSIS — Z9989 Dependence on other enabling machines and devices: Secondary | ICD-10-CM | POA: Diagnosis not present

## 2020-07-23 NOTE — Progress Notes (Signed)
[facility] [place], [place] [address]  Pulmonary Sleep Medicine   Office Visit Note  Patient Name: Charles Blake DOB: 1956/10/15 MRN [id number]  Date of Service: 07/23/2020  Chief Complaint: Obstructive Sleep Apnea visit  Brief History:  Charles Blake is seen today for OSA and CPAP compliance follow-up The patient has a 4 year history of sleep apnea. Patient is using PAP nightly.  The patient feels better after sleeping with PAP.  The patient reports benefit from PAP use. Reported sleepiness is well controlled and the Epworth Sleepiness Score is 2 out of 24. The patient does not take naps. The patient complains of the following: mask leak and mask comfort since switching to a new mask one month prior.  The compliance download shows  compliance with an average use time of 9 hours. The AHI is 4.1.  The patient does not complain of limb movements disrupting sleep.  ROS  General: (-) fever, (-) chills, (-) night sweat Nose and Sinuses: (-) nasal stuffiness or itchiness, (-) postnasal drip, (-) nosebleeds, (-) sinus trouble. Mouth and Throat: (-) sore throat, (-) hoarseness. Neck: (-) swollen glands, (-) enlarged thyroid, (-) neck pain. Respiratory: - cough, - shortness of breath, - wheezing. Neurologic: - numbness, - tingling. Psychiatric: - anxiety, - depression   Current Medication: Outpatient Encounter Medications as of 07/23/2020  Medication Sig Note  . acetaminophen (TYLENOL) 650 MG CR tablet Take 650 mg by mouth every 8 (eight) hours as needed for pain.   Marland Kitchen albuterol (PROVENTIL HFA;VENTOLIN HFA) 108 (90 Base) MCG/ACT inhaler Inhale 2 puffs into the lungs every 4 (four) hours as needed for wheezing.   Marland Kitchen amLODipine (NORVASC) 10 MG tablet Take 10 mg by mouth daily. am (Patient not taking: Reported on 07/10/2020)   . aspirin 81 MG tablet Take 81 mg by mouth daily. am   . atorvastatin (LIPITOR) 40 MG tablet Take 40 mg by mouth. am (Patient not taking: Reported on  07/10/2020)   . carvedilol (COREG) 3.125 MG tablet Take 3.125 mg by mouth 2 (two) times daily with a meal. Am and pm   . clonazePAM (KLONOPIN) 0.5 MG tablet Take 0.5 mg by mouth as needed.  05/24/2018: Been a while   . cyclobenzaprine (FLEXERIL) 10 MG tablet Take 1 tablet (10 mg total) by mouth 3 (three) times daily as needed. (Patient not taking: Reported on 07/08/2019)   . Doxycycline Hyclate 200 MG TBEC  (Patient not taking: Reported on 07/10/2020)   . escitalopram (LEXAPRO) 20 MG tablet Take 20 mg by mouth daily. Am   . fluconazole (DIFLUCAN) 200 MG tablet Take 200 mg by mouth once a week.    . fluticasone (FLONASE) 50 MCG/ACT nasal spray Place into the nose.   . furosemide (LASIX) 20 MG tablet Take by mouth.   . hydrochlorothiazide (HYDRODIURIL) 25 MG tablet  (Patient not taking: Reported on 07/10/2020)   . HYDROcodone-acetaminophen (NORCO/VICODIN) 5-325 MG tablet Take 1 tablet by mouth every 4 (four) hours as needed. (Patient not taking: Reported on 07/08/2019)   . isosorbide mononitrate (IMDUR) 60 MG 24 hr tablet Take by mouth.   Marland Kitchen ketoconazole (NIZORAL) 2 % shampoo APPLY TOPICALLY DIRECTLY TO SCALP EVERY OTHER DAY ALLOW TO SIT FOR 30 60 MINUTES BEFORE RINSING   . ketorolac (TORADOL) 10 MG tablet Take 1 tablet (10 mg total) by mouth every 6 (six) hours as needed. (Patient not taking: Reported on 07/08/2019)   . losartan-hydrochlorothiazide (HYZAAR) 100-25 MG per tablet Take 1 tablet  by mouth daily. am (Patient not taking: Reported on 07/10/2020)   . lovastatin (MEVACOR) 20 MG tablet Take 20 mg by mouth at bedtime.   . Melatonin 3 MG CAPS Take by mouth. pm   . Multiple Vitamin (MULTI-VITAMINS) TABS Take by mouth.   . nitroGLYCERIN (NITROSTAT) 0.4 MG SL tablet SMARTSIG:1 Tablet(s) Sublingual PRN   . Omega-3 Fatty Acids (FISH OIL) 1000 MG CAPS Take by mouth daily. am   . pantoprazole (PROTONIX) 40 MG tablet Take 40 mg by mouth daily. am   . pravastatin (PRAVACHOL) 80 MG tablet Take 80 mg by mouth daily.    . tadalafil (CIALIS) 20 MG tablet Take 20 mg by mouth daily as needed for erectile dysfunction.   Marland Kitchen telmisartan-hydrochlorothiazide (MICARDIS HCT) 80-12.5 MG tablet Take 1 tablet by mouth daily.   Marland Kitchen tiotropium (SPIRIVA) 18 MCG inhalation capsule Place 18 mcg into inhaler and inhale daily. am   . traMADol (ULTRAM) 50 MG tablet Take 1 tablet (50 mg total) by mouth every 6 (six) hours as needed for moderate pain.    No facility-administered encounter medications on file as of 07/23/2020.    Surgical History: Past Surgical History:  Procedure Laterality Date  . APPENDECTOMY    . CARDIAC CATHETERIZATION    . CAROTID ENDARTERECTOMY Left   . CATARACT EXTRACTION Left   . CATARACT EXTRACTION W/PHACO Right 05/24/2018   Procedure: CATARACT EXTRACTION PHACO AND INTRAOCULAR LENS PLACEMENT ([place]) RIGHT;  Surgeon: Eulogio Bear, MD;  Location: [facility];  Service: Ophthalmology;  Laterality: Right;  CPAP, sleep apnea  . COLONOSCOPY N/A 05/29/2015   Procedure: COLONOSCOPY;  Surgeon: Lollie Sails, MD;  Location: [facility] ENDOSCOPY;  Service: Endoscopy;  Laterality: N/A;  . iliac stents    . TONSILLECTOMY      Medical History: Past Medical History:  Diagnosis Date  . Anemia   . Anxiety   . Arthritis    fingers and hands  . Cancer ([place])    skin cancer  . COPD (chronic obstructive pulmonary disease) (H[facility])    wheezing  . Coronary artery disease   . Dyspnea   . GERD (gastroesophageal reflux disease)   . Headache    2-3 times/ week  . History of hiatal hernia   . HOH (hard of hearing)   . Hypercholesteremia   . Hypertension    controlled  . Pneumonia   . Sleep apnea    CPAP  . Wears dentures    upper    Family History: Non contributory to the present illness  Social History: Social History   Socioeconomic History  . Marital status: Single    Spouse name: Not on file  . Number of children: Not on file  . Years of education: Not on file  . Highest education level:  Not on file  Occupational History  . Not on file  Tobacco Use  . Smoking status: Current Every Day Smoker    Packs/day: 0.50    Years: 40.00    Pack years: 20.00    Types: Cigarettes  . Smokeless tobacco: Former Network engineer and Sexual Activity  . Alcohol use: Yes    Comment: rarely  . Drug use: Never  . Sexual activity: Not on file  Other Topics Concern  . Not on file  Social History Narrative  . Not on file   Social Determinants of Health   Financial Resource Strain:   . Difficulty of Paying Living Expenses:   Food Insecurity:   .  Worried About Charity fundraiser in the Last Year:   . Arboriculturist in the Last Year:   Transportation Needs:   . Film/video editor (Medical):   Marland Kitchen Lack of Transportation (Non-Medical):   Physical Activity:   . Days of Exercise per Week:   . Minutes of Exercise per Session:   Stress:   . Feeling of Stress :   Social Connections:   . Frequency of Communication with Friends and Family:   . Frequency of Social Gatherings with Friends and Family:   . Attends Religious Services:   . Active Member of Clubs or Organizations:   . Attends Archivist Meetings:   Marland Kitchen Marital Status:   Intimate Partner Violence:   . Fear of Current or Ex-Partner:   . Emotionally Abused:   Marland Kitchen Physically Abused:   . Sexually Abused:     Vital Signs: Blood pressure (!) 165/80, pulse 75, height 5\' 7"  (1.702 m), weight 233 lb (105.7 kg), SpO2 96 %.  Examination: General Appearance: The patient is well-developed, well-nourished, and in no distress. Neck Circumference: 51 Skin: Gross inspection of skin unremarkable. Head: normocephalic, no gross deformities. Eyes: no gross deformities noted. ENT: ears appear grossly normal Neurologic: Alert and oriented. No involuntary movements.    EPWORTH SLEEPINESS SCALE:  Scale:  (0)= no chance of dozing; (1)= slight chance of dozing; (2)= moderate chance of dozing; (3)= high chance of dozing  Chance   Situtation    Sitting and reading: 0    Watching TV: 0    Sitting Inactive in public: 0    As a passenger in car: 0      Lying down to rest: 2    Sitting and talking: 0    Sitting quielty after lunch: 0    In a car, stopped in traffic: 0   TOTAL SCORE:   2 out of 24    SLEEP STUDIES:  1. PSG 07/2016, AHI 13, SpO2 minimum 81%   CPAP COMPLIANCE DATA:  Date Range: 07/20/2019-07/18/2020  Average Daily Use: 9 hours  Median Use: 9 hours  Compliance for > 4 Hours: 364 days  AHI: 4.1 respiratory events per hour  Days Used: 364/365  Mask Leak: 56.9  LABS: Recent Results (from the past 2160 hour(s))  BUN     Status: None   Collection Time: 07/18/20  9:03 AM  Result Value Ref Range   BUN 19 8 - 23 mg/dL    Comment: Performed at [facility], [address], [place], [place] [address]  Creatinine, serum     Status: None   Collection Time: 07/18/20  9:03 AM  Result Value Ref Range   Creatinine, Ser 1.05 0.61 - 1.24 mg/dL   GFR calc non Af Amer >60 >60 mL/min   GFR calc Af Amer >60 >60 mL/min    Comment: Performed at [facility], [address] [place], [place] [address]    Radiology: CT ANGIO HEAD W OR WO CONTRAST  Result Date: 07/18/2020 CLINICAL DATA:  Carotid artery blockage EXAM: CT ANGIOGRAPHY HEAD AND NECK TECHNIQUE: Multidetector CT imaging of the head and neck was performed using the standard protocol during bolus administration of intravenous contrast. Multiplanar CT image reconstructions and MIPs were obtained to evaluate the vascular anatomy. Carotid stenosis measurements (when applicable) are obtained utilizing NASCET criteria, using the distal internal carotid diameter as the denominator. CONTRAST:  13mL OMNIPAQUE IOHEXOL 350 MG/ML SOLN COMPARISON:  01/13/2014 CTA of  the neck FINDINGS: CT HEAD FINDINGS Brain: No evidence of acute infarction, hemorrhage, hydrocephalus, extra-axial collection or mass lesion/mass effect. Mild  low-density in the cerebral white matter attributed to chronic small vessel ischemia. Vascular: See below Skull: Normal. Negative for fracture or focal lesion. Sinuses: Clear Orbits: Bilateral cataract resection. Review of the MIP images confirms the above findings CTA NECK FINDINGS Aortic arch: Plaque without aneurysm or stenosis where covered. Right carotid system: Mixed density plaque at the common carotid bifurcation and proximal ICA. ICA origin stenosis measures 50% based on sagittal measurement. No ulceration or dissection is seen. Left carotid system: Low-density plaque at the distal common carotid extending into both the proximal ICA and ECA with proximal ICA stenosis measuring 60% based on axial and reformat measurement. There is positive remodeling with a notable posterior outpouching at the level of the bulb which could be thrombogenic. Negative for dissection. Vertebral arteries: Proximal subclavian atherosclerosis without flow limiting stenosis. Heavily calcified right vertebral origin with subjective high-grade stenosis, the lumen is not measurable due to the degree of calcified plaque blooming. On the left, vertebral origin stenosis measures 50%. Both vertebral arteries are symmetrically enhancing at the dura. Skeleton: No acute or aggressive finding Other neck: Bilateral partial mastoid opacification with hypertrophic appearance of the adenoid. No nasopharyngeal asymmetry or worrisome enhancement. Upper chest: No acute finding Review of the MIP images confirms the above findings CTA HEAD FINDINGS Anterior circulation: Bilateral calcified plaque along the carotid siphons. No flow reducing stenosis, branch occlusion, or beading. Negative for aneurysm. Posterior circulation: Robust flow in the vertebral arteries with unremarkable cerebellar branching. The basilar is widely patent. No branch occlusion, beading, or aneurysm. Venous sinuses: Patent Anatomic variants: None significant Review of the MIP  images confirms the above findings IMPRESSION: 1. Advanced atherosclerosis in the neck. 2. Low-density plaque at the left cervical bifurcation causes 60% proximal ICA stenosis and is complicated by a plaque ulceration. 3. 50% proximal right ICA stenosis. 4. Subjectively advanced right vertebral origin stenosis, quantification not possible due to the degree of calcified plaque. 5. Intracranial atherosclerosis without significant stenosis of major vessels. 6. Bilateral partial mastoid opacification. Electronically Signed   By: Monte Fantasia M.D.   On: 07/18/2020 19:51   CT ANGIO NECK W OR WO CONTRAST  Result Date: 07/18/2020 CLINICAL DATA:  Carotid artery blockage EXAM: CT ANGIOGRAPHY HEAD AND NECK TECHNIQUE: Multidetector CT imaging of the head and neck was performed using the standard protocol during bolus administration of intravenous contrast. Multiplanar CT image reconstructions and MIPs were obtained to evaluate the vascular anatomy. Carotid stenosis measurements (when applicable) are obtained utilizing NASCET criteria, using the distal internal carotid diameter as the denominator. CONTRAST:  60mL OMNIPAQUE IOHEXOL 350 MG/ML SOLN COMPARISON:  01/13/2014 CTA of the neck FINDINGS: CT HEAD FINDINGS Brain: No evidence of acute infarction, hemorrhage, hydrocephalus, extra-axial collection or mass lesion/mass effect. Mild low-density in the cerebral white matter attributed to chronic small vessel ischemia. Vascular: See below Skull: Normal. Negative for fracture or focal lesion. Sinuses: Clear Orbits: Bilateral cataract resection. Review of the MIP images confirms the above findings CTA NECK FINDINGS Aortic arch: Plaque without aneurysm or stenosis where covered. Right carotid system: Mixed density plaque at the common carotid bifurcation and proximal ICA. ICA origin stenosis measures 50% based on sagittal measurement. No ulceration or dissection is seen. Left carotid system: Low-density plaque at the distal  common carotid extending into both the proximal ICA and ECA with proximal ICA stenosis measuring 60% based on axial and  reformat measurement. There is positive remodeling with a notable posterior outpouching at the level of the bulb which could be thrombogenic. Negative for dissection. Vertebral arteries: Proximal subclavian atherosclerosis without flow limiting stenosis. Heavily calcified right vertebral origin with subjective high-grade stenosis, the lumen is not measurable due to the degree of calcified plaque blooming. On the left, vertebral origin stenosis measures 50%. Both vertebral arteries are symmetrically enhancing at the dura. Skeleton: No acute or aggressive finding Other neck: Bilateral partial mastoid opacification with hypertrophic appearance of the adenoid. No nasopharyngeal asymmetry or worrisome enhancement. Upper chest: No acute finding Review of the MIP images confirms the above findings CTA HEAD FINDINGS Anterior circulation: Bilateral calcified plaque along the carotid siphons. No flow reducing stenosis, branch occlusion, or beading. Negative for aneurysm. Posterior circulation: Robust flow in the vertebral arteries with unremarkable cerebellar branching. The basilar is widely patent. No branch occlusion, beading, or aneurysm. Venous sinuses: Patent Anatomic variants: None significant Review of the MIP images confirms the above findings IMPRESSION: 1. Advanced atherosclerosis in the neck. 2. Low-density plaque at the left cervical bifurcation causes 60% proximal ICA stenosis and is complicated by a plaque ulceration. 3. 50% proximal right ICA stenosis. 4. Subjectively advanced right vertebral origin stenosis, quantification not possible due to the degree of calcified plaque. 5. Intracranial atherosclerosis without significant stenosis of major vessels. 6. Bilateral partial mastoid opacification. Electronically Signed   By: Monte Fantasia M.D.   On: 07/18/2020 19:51    No results  found.  CT ANGIO HEAD W OR WO CONTRAST  Result Date: 07/18/2020 CLINICAL DATA:  Carotid artery blockage EXAM: CT ANGIOGRAPHY HEAD AND NECK TECHNIQUE: Multidetector CT imaging of the head and neck was performed using the standard protocol during bolus administration of intravenous contrast. Multiplanar CT image reconstructions and MIPs were obtained to evaluate the vascular anatomy. Carotid stenosis measurements (when applicable) are obtained utilizing NASCET criteria, using the distal internal carotid diameter as the denominator. CONTRAST:  57mL OMNIPAQUE IOHEXOL 350 MG/ML SOLN COMPARISON:  01/13/2014 CTA of the neck FINDINGS: CT HEAD FINDINGS Brain: No evidence of acute infarction, hemorrhage, hydrocephalus, extra-axial collection or mass lesion/mass effect. Mild low-density in the cerebral white matter attributed to chronic small vessel ischemia. Vascular: See below Skull: Normal. Negative for fracture or focal lesion. Sinuses: Clear Orbits: Bilateral cataract resection. Review of the MIP images confirms the above findings CTA NECK FINDINGS Aortic arch: Plaque without aneurysm or stenosis where covered. Right carotid system: Mixed density plaque at the common carotid bifurcation and proximal ICA. ICA origin stenosis measures 50% based on sagittal measurement. No ulceration or dissection is seen. Left carotid system: Low-density plaque at the distal common carotid extending into both the proximal ICA and ECA with proximal ICA stenosis measuring 60% based on axial and reformat measurement. There is positive remodeling with a notable posterior outpouching at the level of the bulb which could be thrombogenic. Negative for dissection. Vertebral arteries: Proximal subclavian atherosclerosis without flow limiting stenosis. Heavily calcified right vertebral origin with subjective high-grade stenosis, the lumen is not measurable due to the degree of calcified plaque blooming. On the left, vertebral origin stenosis  measures 50%. Both vertebral arteries are symmetrically enhancing at the dura. Skeleton: No acute or aggressive finding Other neck: Bilateral partial mastoid opacification with hypertrophic appearance of the adenoid. No nasopharyngeal asymmetry or worrisome enhancement. Upper chest: No acute finding Review of the MIP images confirms the above findings CTA HEAD FINDINGS Anterior circulation: Bilateral calcified plaque along the carotid siphons. No flow  reducing stenosis, branch occlusion, or beading. Negative for aneurysm. Posterior circulation: Robust flow in the vertebral arteries with unremarkable cerebellar branching. The basilar is widely patent. No branch occlusion, beading, or aneurysm. Venous sinuses: Patent Anatomic variants: None significant Review of the MIP images confirms the above findings IMPRESSION: 1. Advanced atherosclerosis in the neck. 2. Low-density plaque at the left cervical bifurcation causes 60% proximal ICA stenosis and is complicated by a plaque ulceration. 3. 50% proximal right ICA stenosis. 4. Subjectively advanced right vertebral origin stenosis, quantification not possible due to the degree of calcified plaque. 5. Intracranial atherosclerosis without significant stenosis of major vessels. 6. Bilateral partial mastoid opacification. Electronically Signed   By: Monte Fantasia M.D.   On: 07/18/2020 19:51   CT ANGIO NECK W OR WO CONTRAST  Result Date: 07/18/2020 CLINICAL DATA:  Carotid artery blockage EXAM: CT ANGIOGRAPHY HEAD AND NECK TECHNIQUE: Multidetector CT imaging of the head and neck was performed using the standard protocol during bolus administration of intravenous contrast. Multiplanar CT image reconstructions and MIPs were obtained to evaluate the vascular anatomy. Carotid stenosis measurements (when applicable) are obtained utilizing NASCET criteria, using the distal internal carotid diameter as the denominator. CONTRAST:  53mL OMNIPAQUE IOHEXOL 350 MG/ML SOLN COMPARISON:   01/13/2014 CTA of the neck FINDINGS: CT HEAD FINDINGS Brain: No evidence of acute infarction, hemorrhage, hydrocephalus, extra-axial collection or mass lesion/mass effect. Mild low-density in the cerebral white matter attributed to chronic small vessel ischemia. Vascular: See below Skull: Normal. Negative for fracture or focal lesion. Sinuses: Clear Orbits: Bilateral cataract resection. Review of the MIP images confirms the above findings CTA NECK FINDINGS Aortic arch: Plaque without aneurysm or stenosis where covered. Right carotid system: Mixed density plaque at the common carotid bifurcation and proximal ICA. ICA origin stenosis measures 50% based on sagittal measurement. No ulceration or dissection is seen. Left carotid system: Low-density plaque at the distal common carotid extending into both the proximal ICA and ECA with proximal ICA stenosis measuring 60% based on axial and reformat measurement. There is positive remodeling with a notable posterior outpouching at the level of the bulb which could be thrombogenic. Negative for dissection. Vertebral arteries: Proximal subclavian atherosclerosis without flow limiting stenosis. Heavily calcified right vertebral origin with subjective high-grade stenosis, the lumen is not measurable due to the degree of calcified plaque blooming. On the left, vertebral origin stenosis measures 50%. Both vertebral arteries are symmetrically enhancing at the dura. Skeleton: No acute or aggressive finding Other neck: Bilateral partial mastoid opacification with hypertrophic appearance of the adenoid. No nasopharyngeal asymmetry or worrisome enhancement. Upper chest: No acute finding Review of the MIP images confirms the above findings CTA HEAD FINDINGS Anterior circulation: Bilateral calcified plaque along the carotid siphons. No flow reducing stenosis, branch occlusion, or beading. Negative for aneurysm. Posterior circulation: Robust flow in the vertebral arteries with unremarkable  cerebellar branching. The basilar is widely patent. No branch occlusion, beading, or aneurysm. Venous sinuses: Patent Anatomic variants: None significant Review of the MIP images confirms the above findings IMPRESSION: 1. Advanced atherosclerosis in the neck. 2. Low-density plaque at the left cervical bifurcation causes 60% proximal ICA stenosis and is complicated by a plaque ulceration. 3. 50% proximal right ICA stenosis. 4. Subjectively advanced right vertebral origin stenosis, quantification not possible due to the degree of calcified plaque. 5. Intracranial atherosclerosis without significant stenosis of major vessels. 6. Bilateral partial mastoid opacification. Electronically Signed   By: Monte Fantasia M.D.   On: 07/18/2020 19:51   VAS  [place] ABI WITH/WO TBI  Result Date: 07/17/2020 LOWER EXTREMITY DOPPLER STUDY Indications: Claudication, and peripheral artery disease.  Vascular Interventions: 05/27/12: Bilateral common iliac artery PTA/stents;                         01/19/14: distal aorta & bilateral common iliac artery                         angioplasties. Comparison Study: 07/08/2019 Performing Technologist: Concha Norway RVT  Examination Guidelines: A complete evaluation includes at minimum, Doppler waveform signals and systolic blood pressure reading at the level of bilateral brachial, anterior tibial, and posterior tibial arteries, when vessel segments are accessible. Bilateral testing is considered an integral part of a complete examination. Photoelectric Plethysmograph (PPG) waveforms and toe systolic pressure readings are included as required and additional duplex testing as needed. Limited examinations for reoccurring indications may be performed as noted.  ABI Findings: +---------+------------------+-----+---------+--------+ Right    Rt Pressure (mmHg)IndexWaveform Comment  +---------+------------------+-----+---------+--------+ Brachial 150                                       +---------+------------------+-----+---------+--------+ ATA      153               1.02 triphasic         +---------+------------------+-----+---------+--------+ PTA      154               1.03 triphasic         +---------+------------------+-----+---------+--------+ Great Toe149               0.99 Normal            +---------+------------------+-----+---------+--------+ +---------+------------------+-----+---------+-------+ Left     Lt Pressure (mmHg)IndexWaveform Comment +---------+------------------+-----+---------+-------+ Brachial 150                                     +---------+------------------+-----+---------+-------+ ATA      154               1.03 triphasic        +---------+------------------+-----+---------+-------+ PTA      165               1.10 triphasic        +---------+------------------+-----+---------+-------+ Great Toe160               1.07 Normal           +---------+------------------+-----+---------+-------+ +-------+-----------+-----------+------------+------------+ ABI/TBIToday's ABIToday's TBIPrevious ABIPrevious TBI +-------+-----------+-----------+------------+------------+ Right  1.03       .99        1.05        .97          +-------+-----------+-----------+------------+------------+ Left   1.10       1.07       1.02        .95          +-------+-----------+-----------+------------+------------+ Bilateral ABIs and TBIs appear essentially unchanged compared to prior study on 07/08/2019.  Summary: Right: Resting right ankle-brachial index is within normal range. No evidence of significant right lower extremity arterial disease. The right toe-brachial index is normal. Left: Resting left ankle-brachial index is within normal range. No evidence of significant left lower extremity arterial disease. The left toe-brachial index is normal.  *See  table(s) above for measurements and observations.  Electronically signed by Leotis Pain MD on 07/17/2020 at 5:23:55 PM.    Final    VAS US CAROTID  Result Date: 07/17/2020 Carotid Arterial Duplex Study Indications:   Left endarterectomy and CAS. Other Factors: Left CEA 2015. Performing Technologist: Concha Norway RVT  Examination Guidelines: A complete evaluation includes B-mode imaging, spectral Doppler, color Doppler, and power Doppler as needed of all accessible portions of each vessel. Bilateral testing is considered an integral part of a complete examination. Limited examinations for reoccurring indications may be performed as noted.  Right Carotid Findings: +----------+--------+--------+--------+-------------------------+--------+           PSV cm/sEDV cm/sStenosisPlaque Description       Comments +----------+--------+--------+--------+-------------------------+--------+ CCA Prox  69      17                                                +----------+--------+--------+--------+-------------------------+--------+ CCA Mid   68      21                                                +----------+--------+--------+--------+-------------------------+--------+ CCA Distal85      26                                                +----------+--------+--------+--------+-------------------------+--------+ ICA Prox  181     45      40-59%  heterogenous and calcific         +----------+--------+--------+--------+-------------------------+--------+ ICA Mid   127     41                                                +----------+--------+--------+--------+-------------------------+--------+ ICA Distal158     54                                                +----------+--------+--------+--------+-------------------------+--------+ ECA       139     18                                                +----------+--------+--------+--------+-------------------------+--------+ +----------+--------+-------+----------------+-------------------+           PSV  cm/sEDV cmsDescribe        Arm Pressure (mmHG) +----------+--------+-------+----------------+-------------------+ Subclavian182            Multiphasic, WNL                    +----------+--------+-------+----------------+-------------------+ +---------+--------+--+--------+---------+ VertebralPSV cm/s43EDV cm/sAntegrade +---------+--------+--+--------+---------+  Left Carotid Findings: +----------+--------+--------+--------+------------------+--------+           PSV cm/sEDV cm/sStenosisPlaque DescriptionComments +----------+--------+--------+--------+------------------+--------+ CCA Prox  88      26                                         +----------+--------+--------+--------+------------------+--------+  CCA Mid   87      28                                         +----------+--------+--------+--------+------------------+--------+ CCA Distal73      23                                         +----------+--------+--------+--------+------------------+--------+ ICA Prox  332     108     80-99%                             +----------+--------+--------+--------+------------------+--------+ ICA Mid   257     98                                         +----------+--------+--------+--------+------------------+--------+ ICA Distal276     89                                         +----------+--------+--------+--------+------------------+--------+ ECA       98      13                                         +----------+--------+--------+--------+------------------+--------+ +----------+--------+--------+----------------+-------------------+           PSV cm/sEDV cm/sDescribe        Arm Pressure (mmHG) +----------+--------+--------+----------------+-------------------+ Subclavian129             Multiphasic, WNL                    +----------+--------+--------+----------------+-------------------+ +---------+--------+--+--------+---------+  VertebralPSV cm/s52EDV cm/sAntegrade +---------+--------+--+--------+---------+   Summary: Right Carotid: Velocities in the right ICA are consistent with a 40-59%                stenosis. Mild to moderate heterogeneous plaque in the ICA                suggesting stenosis in the high end of 40-59%. Left Carotid: Velocities in the left ICA are consistent with a 80-99% stenosis.               Significant narrowing and high velocities in the ICA suggesting an               80-99% restenosis of the ICA status post CEA in 2015. Vertebrals:  Bilateral vertebral arteries demonstrate antegrade flow. Subclavians: Normal flow hemodynamics were seen in bilateral subclavian              arteries. *See table(s) above for measurements and observations.  Electronically signed by Leotis Pain MD on 07/17/2020 at 5:23:47 PM.    Final    VAS US AORTA/IVC/ILIACS  Result Date: 07/17/2020 ABDOMINAL AORTA STUDY Vascular Interventions: 05/27/12: Bilateral common iliac artery PTA/stents;                         01/19/14: distal aorta & bilateral common iliac artery  angioplasties.  Comparison Study: 07/08/2019 Performing Technologist: Concha Norway RVT  Examination Guidelines: A complete evaluation includes B-mode imaging, spectral Doppler, color Doppler, and power Doppler as needed of all accessible portions of each vessel. Bilateral testing is considered an integral part of a complete examination. Limited examinations for reoccurring indications may be performed as noted.  Abdominal Aorta Findings: +-------------+-------+----------+----------+--------+--------+--------+ Location     AP (cm)Trans (cm)PSV (cm/s)WaveformThrombusComments +-------------+-------+----------+----------+--------+--------+--------+ Proximal     1.89   1.94      89        biphasic                 +-------------+-------+----------+----------+--------+--------+--------+ Mid                           102       biphasic                  +-------------+-------+----------+----------+--------+--------+--------+ Distal       1.86   1.89      113       biphasic                 +-------------+-------+----------+----------+--------+--------+--------+ RT CIA Prox  1.2    1.4       178       biphasic                 +-------------+-------+----------+----------+--------+--------+--------+ RT CIA Mid                    177       biphasic                 +-------------+-------+----------+----------+--------+--------+--------+ RT CIA Distal                 147       biphasic                 +-------------+-------+----------+----------+--------+--------+--------+ RT EIA Prox                   132       biphasic                 +-------------+-------+----------+----------+--------+--------+--------+ RT EIA Mid                    120       biphasic                 +-------------+-------+----------+----------+--------+--------+--------+ RT EIA Distal                 94        biphasic                 +-------------+-------+----------+----------+--------+--------+--------+ LT CIA Prox  1.3    1.4       155       biphasic                 +-------------+-------+----------+----------+--------+--------+--------+ LT CIA Mid                    176       biphasic                 +-------------+-------+----------+----------+--------+--------+--------+ LT CIA Distal                 147       biphasic                 +-------------+-------+----------+----------+--------+--------+--------+  LT EIA Prox                   100       biphasic                 +-------------+-------+----------+----------+--------+--------+--------+ LT EIA Mid                    107       biphasic                 +-------------+-------+----------+----------+--------+--------+--------+ LT EIA Distal                 110       biphasic                  +-------------+-------+----------+----------+--------+--------+--------+  Summary: Abdominal Aorta: No evidence of an abdominal aortic aneurysm was visualized. The largest aortic measurement is 1.9 cm. Moderate atherosclerosis throughout. Distal aorta does not appear dilated. Evidence of patent stents bilaterally with no significant stenosis.  *See table(s) above for measurements and observations.  Electronically signed by Leotis Pain MD on 07/17/2020 at 5:23:57 PM.    Final     Assessment and Plan: Patient Active Problem List   Diagnosis Date Noted  . Hyperlipidemia 07/06/2018  . Hypertension 07/07/2017  . Aortic atherosclerosis ([place]) 07/07/2017  . Carotid artery stenosis 07/07/2017  . PAD (peripheral artery disease) ([place]) 07/07/2017  . Benign essential hypertension 06/14/2015  . Coronary artery disease 02/06/2015  . Complete left bundle branch block 01/19/2015  . Psoriasis 01/10/2015  . OSA (obstructive sleep apnea) 12/19/2014  . Anxiety 07/06/2014  . GERD (gastroesophageal reflux disease) 07/06/2014  . Fracture of metatarsal of left foot, open 12/06/2013      The patient does tolerate PAP and reports benefit from PAP use. The patient was reminded how to wear mask properly and advised to stop using So Clean machine for cleaning machine, encouraged to use vinegar and water. The patient was also counselled on continuing to use her CPAP nightly. The compliance is very good. The AHI is 4.1.   1. OSA on CPAP Continue to use CPAP. Encouraged to try out a new mask as his compliance report shows high pressure leaks. Will have him switch to a new mask and review download for compliance and mask leak.  2. Essential hypertension Elevated BP today, encouraged to follow-up with PCP.  General Counseling: I have discussed the findings of the evaluation and examination with Charles Blake.  I have also discussed any further diagnostic evaluation thatmay be needed or ordered today. Charles Blake verbalizes  understanding of the findings of todays visit. We also reviewed his medications today and discussed drug interactions and side effects including but not limited excessive drowsiness and altered mental states. We also discussed that there is always a risk not just to him but also people around him. he has been encouraged to call the office with any questions or concerns that should arise related to todays visit.  No orders of the defined types were placed in this encounter.    This patient was seen today by Theodoro Grist, AGNP-C in collaboration with Dr. Allyne Gee as part of a collaborative care agreement.   I have personally obtained a history, examined the patient, evaluated laboratory and imaging results, formulated the assessment and plan and placed orders.   Richelle Ito Saunders Glance, PhD, FAASM  Diplomate, American Board of Sleep Medicine    Allyne Gee, MD [facility] Diplomate ABMS Pulmonary  and Critical Care Medicine Sleep medicine

## 2020-07-23 NOTE — Patient Instructions (Signed)

## 2020-08-03 ENCOUNTER — Other Ambulatory Visit: Payer: Self-pay

## 2020-08-03 ENCOUNTER — Emergency Department: Payer: Medicare Other

## 2020-08-03 ENCOUNTER — Encounter: Payer: Self-pay | Admitting: Emergency Medicine

## 2020-08-03 DIAGNOSIS — Z7982 Long term (current) use of aspirin: Secondary | ICD-10-CM | POA: Diagnosis not present

## 2020-08-03 DIAGNOSIS — Z5321 Procedure and treatment not carried out due to patient leaving prior to being seen by health care provider: Secondary | ICD-10-CM | POA: Diagnosis not present

## 2020-08-03 DIAGNOSIS — R079 Chest pain, unspecified: Secondary | ICD-10-CM | POA: Diagnosis present

## 2020-08-03 LAB — CBC
HCT: 39.9 % (ref 39.0–52.0)
Hemoglobin: 13.9 g/dL (ref 13.0–17.0)
MCH: 33.1 pg (ref 26.0–34.0)
MCHC: 34.8 g/dL (ref 30.0–36.0)
MCV: 95 fL (ref 80.0–100.0)
Platelets: 226 10*3/uL (ref 150–400)
RBC: 4.2 MIL/uL — ABNORMAL LOW (ref 4.22–5.81)
RDW: 13.6 % (ref 11.5–15.5)
WBC: 11.1 10*3/uL — ABNORMAL HIGH (ref 4.0–10.5)
nRBC: 0 % (ref 0.0–0.2)

## 2020-08-03 LAB — BASIC METABOLIC PANEL
Anion gap: 8 (ref 5–15)
BUN: 17 mg/dL (ref 8–23)
CO2: 29 mmol/L (ref 22–32)
Calcium: 8.9 mg/dL (ref 8.9–10.3)
Chloride: 104 mmol/L (ref 98–111)
Creatinine, Ser: 1.16 mg/dL (ref 0.61–1.24)
GFR calc Af Amer: >60 mL/min (ref >60)
GFR calc non Af Amer: >60 mL/min (ref >60)
Glucose, Bld: 99 mg/dL (ref 70–99)
Potassium: 4.2 mmol/L (ref 3.5–5.1)
Sodium: 141 mmol/L (ref 135–145)

## 2020-08-03 LAB — TROPONIN I (HIGH SENSITIVITY): Troponin I (High Sensitivity): 14 ng/L (ref <18)

## 2020-08-03 NOTE — ED Triage Notes (Signed)
Patient with complaint of chest pain and shortness of breath that started about 18:00 tonight. Patient states that he took two nitro tablets with no improvement.

## 2020-08-03 NOTE — ED Notes (Signed)
Patient to waiting room via wheelchair by EMS.  Per EMS patient with chest pain for approximately 1 hours.  Patient took Ntg x 2 (relieved pain) and baby aspirin x4. 12 lead showing a normal sinus rhythm.

## 2020-08-04 ENCOUNTER — Emergency Department
Admission: EM | Admit: 2020-08-04 | Discharge: 2020-08-04 | Disposition: A | Discharge status: Home / Self Care | Payer: Medicare Other | Attending: Emergency Medicine | Admitting: Emergency Medicine

## 2020-08-07 ENCOUNTER — Ambulatory Visit (INDEPENDENT_AMBULATORY_CARE_PROVIDER_SITE_OTHER): Payer: Medicare Other | Admitting: Vascular Surgery

## 2020-08-07 ENCOUNTER — Other Ambulatory Visit: Payer: Self-pay

## 2020-08-07 ENCOUNTER — Encounter (INDEPENDENT_AMBULATORY_CARE_PROVIDER_SITE_OTHER): Payer: Self-pay | Admitting: Vascular Surgery

## 2020-08-07 VITALS — BP 174/90 | HR 73 | Resp 16 | Wt 231.8 lb

## 2020-08-07 DIAGNOSIS — I10 Essential (primary) hypertension: Secondary | ICD-10-CM | POA: Diagnosis not present

## 2020-08-07 DIAGNOSIS — I6523 Occlusion and stenosis of bilateral carotid arteries: Secondary | ICD-10-CM

## 2020-08-07 DIAGNOSIS — I739 Peripheral vascular disease, unspecified: Secondary | ICD-10-CM | POA: Diagnosis not present

## 2020-08-07 DIAGNOSIS — Z0289 Encounter for other administrative examinations: Secondary | ICD-10-CM

## 2020-08-07 DIAGNOSIS — I700 Atherosclerosis of aorta: Secondary | ICD-10-CM

## 2020-08-07 DIAGNOSIS — E785 Hyperlipidemia, unspecified: Secondary | ICD-10-CM

## 2020-08-07 NOTE — Patient Instructions (Signed)
Carotid Angioplasty With Stent Carotid angioplasty with stent is a procedure to open or widen an artery in the neck (carotid artery) that has become narrowed. This is done by inflating a small balloon inside the artery and then placing a small piece of metal that looks like a coil or spring (stent) inside the artery. The stent helps keep the artery open by supporting the artery walls. The carotid arteries supply blood to the brain. When fats, cholesterol, and other materials (plaque) build up in an artery, the artery becomes narrow and can become blocked. This can reduce or block blood flow to certain areas of the brain, which can cause serious health problems, including stroke. Tell a health care provider about:  Any allergies you have.  All medicines you are taking, including vitamins, herbs, eye drops, creams, and over-the-counter medicines.  Any problems you or family members have had with anesthetic medicines.  Any blood disorders you have.  Any surgeries you have had.  Any medical conditions you have.  Whether you are pregnant or may be pregnant. What are the risks? Generally, this is a safe procedure. However, problems may occur, including:  Infection.  Bleeding.  Allergic reactions to medicines or dyes.  Damage to other structures or organs, or to the carotid artery itself.  The carotid artery becoming blocked again.  A collection of blood under the skin (hematoma) around the stent site that gets larger.  A blood clot in another part of the body.  Kidney injury.  Stroke.  Heart attack. What happens before the procedure?  Ask your health care provider about: ? Changing or stopping your regular medicines. This is especially important if you are taking diabetes medicines or blood thinners. ? Whether aspirin is recommended before this procedure. ? Taking over-the-counter medicines, vitamins, herbs, and supplements.  Follow instructions from your health care provider  about eating or drinking restrictions.  Do not use any products that contain nicotine or tobacco for 4 weeks before the procedure. These products include cigarettes, e-cigarettes, and chewing tobacco. If you need help quitting, ask your health care provider.  Ask your health care provider what steps will be taken to help prevent infection. These may include: ? Removing hair at the surgery site. ? Washing skin with a germ-killing soap. ? Taking antibiotic medicine.  You may have blood tests and imaging tests done.  Plan to have someone take you home from the hospital or clinic.  If you will be going home right after the procedure, plan to have someone with you for 24 hours. What happens during the procedure?   An IV will be inserted into one of your veins.  You may be given one or more of the following: ? A medicine to help you relax (sedative). ? A medicine to numb the area where the catheter will be inserted (local anesthetic).  Most commonly, an incision will be made in your groin. In some cases, an incision may be made in your wrist or forearm instead of your groin.  A small, thin tube (catheter) will be inserted through your incision, into an artery. The catheter will be threaded upward into your carotid artery. An X-ray machine (fluoroscope) will help your health care provider guide the catheter to the correct place in your artery.  Dye will be injected into the catheter and will travel to the narrow or blocked part of your carotid artery.  X-ray images will be taken of how the dye flows through your artery. While the images  are being taken, you may be given instructions about breathing, swallowing, moving, or talking.  A filter (distal protection device) will be inserted into your artery. This will be used to catch plaque that comes loose in your artery during the procedure. This reduces the risk of plaque moving into your brain.  A small balloon will be inserted into your  artery. The balloon will be inflated for a few seconds to widen your artery and will then be removed.  The stent will be placed in your artery.  A second small balloon will be inserted into your artery and inflated. This expands the stent inside of your artery so that the stent holds up the artery walls. The balloon will then be removed.  The catheter and the distal protection device will be removed from your artery.  Your incision may be closed with stitches (sutures), skin glue, or adhesive tape.  A bandage (dressing) will be placed over your incision. The procedure may vary among health care providers and hospitals. What happens after the procedure?  Your blood pressure, heart rate, breathing rate, and blood oxygen level will be monitored until you leave the hospital or clinic.  You may continue to receive fluids and medicines through an IV.  You may need to have pressure placed on the incision site to prevent bleeding.  You will need to keep the area still for a few hours, or as long as directed by your health care provider. If the procedure was done in the groin, you will be instructed not to bend or cross your legs.  You may have some pain. Pain medicines will be available to help you.  You may have a test that uses sound waves to take pictures (ultrasound) of the carotid artery. This can be compared to future tests to check for changes in the artery.  Do not drive for 24 hours. Summary  Carotid angioplasty with stent is a procedure to open or widen an artery in the neck (carotid artery) that has become narrowed.  The procedure is done to lower the risk of problems that can result from reduced blood flow to the brain, including a stroke.  The stent placed inside the artery will help keep the artery open by supporting the artery walls.  Follow instructions from your health care provider about taking medicines and about eating and drinking before the procedure. This  information is not intended to replace advice given to you by your health care provider. Make sure you discuss any questions you have with your health care provider. Document Revised: 09/21/2018 Document Reviewed: 09/16/2018 Elsevier Patient Education  2020 [employer].

## 2020-08-07 NOTE — Assessment & Plan Note (Signed)
He has undergone a CT angiogram which I have independently reviewed.  The official report is of a 50% right ICA stenosis and a 60% ulcerated lesion in the left carotid artery.  This does not correlate with a duplex which suggested a high-grade left carotid artery stenosis.  I believe the degree of stenosis is significantly worse in the 60% left ICA stenosis that has been reported and would be closer to 80% by my review. Given these findings, the patient should undergo a catheter-based angiogram of the left carotid artery to help differentiate between the disparate findings between duplex and CT scan.  If indeed a high-grade recurrent stenosis is identified, proceeding with intervention at that time would certainly be a reasonable option.  For recurrent stenosis, endovascular therapy would be highly preferred over open surgical therapy if the anatomy is amenable and it appears to be.  The patient also has significant heart disease and is not a great candidate for surgery with general anesthesia.  Surgery would also be a redo surgery markedly complicating the situation.  The patient has had syncopal episodes and potential TIA symptoms although it is difficult to differentiate between that and his coronary disease.  The patient and his wife voiced their understanding.  This has been scheduled for 08/20/2020.

## 2020-08-07 NOTE — Assessment & Plan Note (Signed)
Many years status post intervention.  Recently checked and being followed.  No change in medical regimen.  No role for intervention at this time.

## 2020-08-07 NOTE — Progress Notes (Signed)
MRN : [id number]  Charles Blake is a 64 y.o. (June 26, 1956) male who presents with chief complaint of  Chief Complaint  Patient presents with  . Follow-up    ct results  .  History of Present Illness: Patient returns today in follow up of carotid disease.  He has had one episode of near syncope with chest pain since his last visit.  He was outside in the heat and there was concern he may have had a heart attack.  He went to the hospital and was ruled out for MI.  He does have significant coronary disease which is known.  He has undergone a CT angiogram which I have independently reviewed.  The official report is of a 50% right ICA stenosis and a 60% ulcerated lesion in the left carotid artery.  This does not correlate with a duplex which suggested a high-grade left carotid artery stenosis.  I believe the degree of stenosis is significantly worse in the 60% left ICA stenosis that has been reported and would be closer to 80% by my review.  Current Outpatient Medications  Medication Sig Dispense Refill  . acetaminophen (TYLENOL) 650 MG CR tablet Take 650 mg by mouth every 8 (eight) hours as needed for pain.    Marland Kitchen albuterol (PROVENTIL HFA;VENTOLIN HFA) 108 (90 Base) MCG/ACT inhaler Inhale 2 puffs into the lungs every 4 (four) hours as needed for wheezing. 1 Inhaler 0  . aspirin 81 MG tablet Take 81 mg by mouth daily. am    . carvedilol (COREG) 3.125 MG tablet Take 3.125 mg by mouth 2 (two) times daily with a meal. Am and pm    . clonazePAM (KLONOPIN) 0.5 MG tablet Take 0.5 mg by mouth as needed.     Marland Kitchen escitalopram (LEXAPRO) 20 MG tablet Take 20 mg by mouth daily. Am    . fluconazole (DIFLUCAN) 200 MG tablet Take 200 mg by mouth once a week.     . fluticasone (FLONASE) 50 MCG/ACT nasal spray Place into the nose.    . furosemide (LASIX) 20 MG tablet Take by mouth.    . isosorbide mononitrate (IMDUR) 60 MG 24 hr tablet Take by mouth.    Marland Kitchen ketoconazole (NIZORAL) 2 % shampoo APPLY TOPICALLY  DIRECTLY TO SCALP EVERY OTHER DAY ALLOW TO SIT FOR 30 60 MINUTES BEFORE RINSING    . lovastatin (MEVACOR) 20 MG tablet Take 20 mg by mouth at bedtime.    . Melatonin 3 MG CAPS Take by mouth. pm    . Multiple Vitamin (MULTI-VITAMINS) TABS Take by mouth.    . nitroGLYCERIN (NITROSTAT) 0.4 MG SL tablet SMARTSIG:1 Tablet(s) Sublingual PRN    . Omega-3 Fatty Acids (FISH OIL) 1000 MG CAPS Take by mouth daily. am    . pantoprazole (PROTONIX) 40 MG tablet Take 40 mg by mouth daily. am    . pravastatin (PRAVACHOL) 80 MG tablet Take 80 mg by mouth daily.    . tadalafil (CIALIS) 20 MG tablet Take 20 mg by mouth daily as needed for erectile dysfunction.    Marland Kitchen telmisartan-hydrochlorothiazide (MICARDIS HCT) 80-12.5 MG tablet Take 1 tablet by mouth daily.  3  . tiotropium (SPIRIVA) 18 MCG inhalation capsule Place 18 mcg into inhaler and inhale daily. am    . traMADol (ULTRAM) 50 MG tablet Take 1 tablet (50 mg total) by mouth every 6 (six) hours as needed for moderate pain. 12 tablet 0  . amLODipine (NORVASC) 10 MG tablet Take 10 mg by mouth daily.  am (Patient not taking: Reported on 07/10/2020)    . atorvastatin (LIPITOR) 40 MG tablet Take 40 mg by mouth. am (Patient not taking: Reported on 07/10/2020)    . cyclobenzaprine (FLEXERIL) 10 MG tablet Take 1 tablet (10 mg total) by mouth 3 (three) times daily as needed. (Patient not taking: Reported on 07/08/2019) 15 tablet 0  . Doxycycline Hyclate 200 MG TBEC  (Patient not taking: Reported on 07/10/2020)  0  . hydrochlorothiazide (HYDRODIURIL) 25 MG tablet  (Patient not taking: Reported on 07/10/2020)  6  . HYDROcodone-acetaminophen (NORCO/VICODIN) 5-325 MG tablet Take 1 tablet by mouth every 4 (four) hours as needed. (Patient not taking: Reported on 07/08/2019) 15 tablet 0  . ketorolac (TORADOL) 10 MG tablet Take 1 tablet (10 mg total) by mouth every 6 (six) hours as needed. (Patient not taking: Reported on 07/08/2019) 20 tablet 0  . losartan-hydrochlorothiazide (HYZAAR)  100-25 MG per tablet Take 1 tablet by mouth daily. am (Patient not taking: Reported on 07/10/2020)     No current facility-administered medications for this visit.    Past Medical History:  Diagnosis Date  . Anemia   . Anxiety   . Arthritis    fingers and hands  . Cancer ([place])    skin cancer  . COPD (chronic obstructive pulmonary disease) ([facility])    wheezing  . Coronary artery disease   . Dyspnea   . GERD (gastroesophageal reflux disease)   . Headache    2-3 times/ week  . History of hiatal hernia   . HOH (hard of hearing)   . Hypercholesteremia   . Hypertension    controlled  . Pneumonia   . Sleep apnea    CPAP  . Wears dentures    upper    Past Surgical History:  Procedure Laterality Date  . APPENDECTOMY    . CARDIAC CATHETERIZATION    . CAROTID ENDARTERECTOMY Left   . CATARACT EXTRACTION Left   . CATARACT EXTRACTION W/PHACO Right 05/24/2018   Procedure: CATARACT EXTRACTION PHACO AND INTRAOCULAR LENS PLACEMENT ([place]) RIGHT;  Surgeon: Eulogio Bear, MD;  Location: [facility];  Service: Ophthalmology;  Laterality: Right;  CPAP, sleep apnea  . COLONOSCOPY N/A 05/29/2015   Procedure: COLONOSCOPY;  Surgeon: Lollie Sails, MD;  [facility] ENDOSCOPY;  Service: Endoscopy;  Laterality: N/A;  . iliac stents    . TONSILLECTOMY       Social History   Tobacco Use  . Smoking status: Current Every Day Smoker    Packs/day: 0.50    Years: 40.00    Pack years: 20.00    Types: Cigarettes  . Smokeless tobacco: Former Network engineer Use Topics  . Alcohol use: Yes    Comment: rarely  . Drug use: Never     Family History  Problem Relation Age of Onset  . Heart attack Mother   . Hypertension Mother   . Kidney disease Mother   . Heart attack Father   . Hypertension Father   . Colon cancer Father   . Heart attack Brother     Allergies  Allergen Reactions  . Rosuvastatin     Other reaction(s): Other (See Comments) Extreme joint pain & fatigue      REVIEW OF SYSTEMS(Negative unless checked)  Constitutional: [] ??Weight loss[] ??Fever[] ??Chills Cardiac:[] ??Chest pain[] ??Chest pressure[] ??Palpitations [] ??Shortness of breath when laying flat [] ??Shortness of breath at rest [] ??Shortness of breath with exertion. Vascular: [] ??Pain in legs with walking[] ??Pain in legsat rest[] ??Pain in legs when laying flat [] ??Claudication [] ??Pain in feet  when walking [] ??Pain in feet at rest [] ??Pain in feet when laying flat [] ??History of DVT [] ??Phlebitis [] ??Swelling in legs [] ??Varicose veins [] ??Non-healing ulcers Pulmonary: [] ??Uses home oxygen [] ??Productive cough[] ??Hemoptysis [] ??Wheeze [] ??COPD [] ??Asthma Neurologic: [] ??Dizziness [] ??Blackouts [] ??Seizures [] ??History of stroke [] ??History of TIA[] ??Aphasia [] ??Temporary blindness[] ??Dysphagia [] ??Weaknessor numbness in arms [] ??Weakness or numbnessin legs Musculoskeletal: [x] ??Arthritis [] ??Joint swelling [] ??Joint pain [] ??Low back pain Hematologic:[] ??Easy bruising[] ??Easy bleeding [] ??Hypercoagulable state [] ??Anemic  Gastrointestinal:[] ??Blood in stool[] ??Vomiting blood[x] ??Gastroesophageal reflux/heartburn[] ??Abdominal pain Genitourinary: [] ??Chronic kidney disease [] ??Difficulturination [] ??Frequenturination [] ??Burning with urination[] ??Hematuria Skin: [] ??Rashes [] ??Ulcers [] ??Wounds Psychological: [] ??History of anxiety[] ??History of major depression.   Physical Examination  BP (!) 174/90 (BP Location: Right Arm)   Pulse 73   Resp 16   Wt 231 lb 12.8 oz (105.1 kg)   BMI 36.31 kg/m  Gen:  WD/WN, NAD Head: [place]/AT, No temporalis wasting. Ear/Nose/Throat: Hearing grossly intact, nares w/o erythema or drainage Eyes: Conjunctiva clear. Sclera non-icteric Neck: Supple.  Trachea midline Pulmonary:  Good air movement, no use of accessory muscles.  Cardiac: RRR, no  JVD Vascular:  Vessel Right Left  Radial Palpable Palpable               Musculoskeletal: M/S 5/5 throughout.  No deformity or atrophy.  Mild lower extremity edema. Neurologic: Sensation grossly intact in extremities.  Symmetrical.  Speech is fluent.  Psychiatric: Judgment intact, Mood & affect appropriate for pt's clinical situation. Dermatologic: No rashes or ulcers noted.  No cellulitis or open wounds.       Labs Recent Results (from the past 2160 hour(s))  BUN     Status: None   Collection Time: 07/18/20  9:03 AM  Result Value Ref Range   BUN 19 8 - 23 mg/dL    Comment: Performed at [facility], [address] [place], [place] [address]  Creatinine, serum     Status: None   Collection Time: 07/18/20  9:03 AM  Result Value Ref Range   Creatinine, Ser 1.05 0.61 - 1.24 mg/dL   GFR calc non Af Amer >60 >60 mL/min   GFR calc Af Amer >60 >60 mL/min    Comment: Performed at [facility], [address] [place], [place] [address]  Basic metabolic panel     Status: None   Collection Time: 08/03/20  9:35 PM  Result Value Ref Range   Sodium 141 135 - 145 mmol/L   Potassium 4.2 3.5 - 5.1 mmol/L   Chloride 104 98 - 111 mmol/L   CO2 29 22 - 32 mmol/L   Glucose, Bld 99 70 - 99 mg/dL    Comment: Glucose reference range applies only to samples taken after fasting for at least 8 hours.   BUN 17 8 - 23 mg/dL   Creatinine, Ser 1.16 0.61 - 1.24 mg/dL   Calcium 8.9 8.9 - 10.3 mg/dL   GFR calc non Af Amer >60 >60 mL/min   GFR calc Af Amer >60 >60 mL/min   Anion gap 8 5 - 15    Comment: Performed at [facility], [address] [place], [place] [address]  CBC     Status: Abnormal   Collection Time: 08/03/20  9:35 PM  Result Value Ref Range   WBC 11.1 (H) 4.0 - 10.5 K/uL   RBC 4.20 (L) 4.22 - 5.81 MIL/uL   Hemoglobin 13.9 13.0 - 17.0 g/dL   HCT 39.9 39 - 52 %   MCV 95.0 80.0 - 100.0 fL   MCH 33.1 26.0 - 34.0 pg   MCHC 34.8  30.0 - 36.0 g/dL    RDW 13.6 11.5 - 15.5 %   Platelets 226 150 - 400 K/uL   nRBC 0.0 0.0 - 0.2 %    Comment: Performed at [facility], [place], [place] [address]  Troponin I (High Sensitivity)     Status: None   Collection Time: 08/03/20  9:35 PM  Result Value Ref Range   Troponin I (High Sensitivity) 14 <18 ng/L    Comment: (NOTE) Elevated high sensitivity troponin I (hsTnI) values and significant  changes across serial measurements may suggest ACS but many other  chronic and acute conditions are known to elevate hsTnI results.  Refer to the "Links" section for chest pain algorithms and additional  guidance. Performed at [facility], [address] [place], [place] [address]     Radiology CT ANGIO HEAD W OR WO CONTRAST  Result Date: 07/18/2020 CLINICAL DATA:  Carotid artery blockage EXAM: CT ANGIOGRAPHY HEAD AND NECK TECHNIQUE: Multidetector CT imaging of the head and neck was performed using the standard protocol during bolus administration of intravenous contrast. Multiplanar CT image reconstructions and MIPs were obtained to evaluate the vascular anatomy. Carotid stenosis measurements (when applicable) are obtained utilizing NASCET criteria, using the distal internal carotid diameter as the denominator. CONTRAST:  47mL OMNIPAQUE IOHEXOL 350 MG/ML SOLN COMPARISON:  01/13/2014 CTA of the neck FINDINGS: CT HEAD FINDINGS Brain: No evidence of acute infarction, hemorrhage, hydrocephalus, extra-axial collection or mass lesion/mass effect. Mild low-density in the cerebral white matter attributed to chronic small vessel ischemia. Vascular: See below Skull: Normal. Negative for fracture or focal lesion. Sinuses: Clear Orbits: Bilateral cataract resection. Review of the MIP images confirms the above findings CTA NECK FINDINGS Aortic arch: Plaque without aneurysm or stenosis where covered. Right carotid system: Mixed density plaque at the common carotid bifurcation and proximal  ICA. ICA origin stenosis measures 50% based on sagittal measurement. No ulceration or dissection is seen. Left carotid system: Low-density plaque at the distal common carotid extending into both the proximal ICA and ECA with proximal ICA stenosis measuring 60% based on axial and reformat measurement. There is positive remodeling with a notable posterior outpouching at the level of the bulb which could be thrombogenic. Negative for dissection. Vertebral arteries: Proximal subclavian atherosclerosis without flow limiting stenosis. Heavily calcified right vertebral origin with subjective high-grade stenosis, the lumen is not measurable due to the degree of calcified plaque blooming. On the left, vertebral origin stenosis measures 50%. Both vertebral arteries are symmetrically enhancing at the dura. Skeleton: No acute or aggressive finding Other neck: Bilateral partial mastoid opacification with hypertrophic appearance of the adenoid. No nasopharyngeal asymmetry or worrisome enhancement. Upper chest: No acute finding Review of the MIP images confirms the above findings CTA HEAD FINDINGS Anterior circulation: Bilateral calcified plaque along the carotid siphons. No flow reducing stenosis, branch occlusion, or beading. Negative for aneurysm. Posterior circulation: Robust flow in the vertebral arteries with unremarkable cerebellar branching. The basilar is widely patent. No branch occlusion, beading, or aneurysm. Venous sinuses: Patent Anatomic variants: None significant Review of the MIP images confirms the above findings IMPRESSION: 1. Advanced atherosclerosis in the neck. 2. Low-density plaque at the left cervical bifurcation causes 60% proximal ICA stenosis and is complicated by a plaque ulceration. 3. 50% proximal right ICA stenosis. 4. Subjectively advanced right vertebral origin stenosis, quantification not possible due to the degree of calcified plaque. 5. Intracranial atherosclerosis without significant stenosis  of major vessels. 6. Bilateral partial mastoid opacification.  Electronically Signed   By: Monte Fantasia M.D.   On: 07/18/2020 19:51   DG Chest 2 View  Result Date: 08/03/2020 CLINICAL DATA:  Chest pain and shortness of breath EXAM: CHEST - 2 VIEW COMPARISON:  02/02/2017 FINDINGS: The heart size and mediastinal contours are within normal limits. Both lungs are mildly hyperinflated. The visualized skeletal structures are unremarkable. IMPRESSION: COPD without acute abnormality. Electronically Signed   By: Inez Catalina M.D.   On: 08/03/2020 22:10   CT ANGIO NECK W OR WO CONTRAST  Result Date: 07/18/2020 CLINICAL DATA:  Carotid artery blockage EXAM: CT ANGIOGRAPHY HEAD AND NECK TECHNIQUE: Multidetector CT imaging of the head and neck was performed using the standard protocol during bolus administration of intravenous contrast. Multiplanar CT image reconstructions and MIPs were obtained to evaluate the vascular anatomy. Carotid stenosis measurements (when applicable) are obtained utilizing NASCET criteria, using the distal internal carotid diameter as the denominator. CONTRAST:  45mL OMNIPAQUE IOHEXOL 350 MG/ML SOLN COMPARISON:  01/13/2014 CTA of the neck FINDINGS: CT HEAD FINDINGS Brain: No evidence of acute infarction, hemorrhage, hydrocephalus, extra-axial collection or mass lesion/mass effect. Mild low-density in the cerebral white matter attributed to chronic small vessel ischemia. Vascular: See below Skull: Normal. Negative for fracture or focal lesion. Sinuses: Clear Orbits: Bilateral cataract resection. Review of the MIP images confirms the above findings CTA NECK FINDINGS Aortic arch: Plaque without aneurysm or stenosis where covered. Right carotid system: Mixed density plaque at the common carotid bifurcation and proximal ICA. ICA origin stenosis measures 50% based on sagittal measurement. No ulceration or dissection is seen. Left carotid system: Low-density plaque at the distal common carotid  extending into both the proximal ICA and ECA with proximal ICA stenosis measuring 60% based on axial and reformat measurement. There is positive remodeling with a notable posterior outpouching at the level of the bulb which could be thrombogenic. Negative for dissection. Vertebral arteries: Proximal subclavian atherosclerosis without flow limiting stenosis. Heavily calcified right vertebral origin with subjective high-grade stenosis, the lumen is not measurable due to the degree of calcified plaque blooming. On the left, vertebral origin stenosis measures 50%. Both vertebral arteries are symmetrically enhancing at the dura. Skeleton: No acute or aggressive finding Other neck: Bilateral partial mastoid opacification with hypertrophic appearance of the adenoid. No nasopharyngeal asymmetry or worrisome enhancement. Upper chest: No acute finding Review of the MIP images confirms the above findings CTA HEAD FINDINGS Anterior circulation: Bilateral calcified plaque along the carotid siphons. No flow reducing stenosis, branch occlusion, or beading. Negative for aneurysm. Posterior circulation: Robust flow in the vertebral arteries with unremarkable cerebellar branching. The basilar is widely patent. No branch occlusion, beading, or aneurysm. Venous sinuses: Patent Anatomic variants: None significant Review of the MIP images confirms the above findings IMPRESSION: 1. Advanced atherosclerosis in the neck. 2. Low-density plaque at the left cervical bifurcation causes 60% proximal ICA stenosis and is complicated by a plaque ulceration. 3. 50% proximal right ICA stenosis. 4. Subjectively advanced right vertebral origin stenosis, quantification not possible due to the degree of calcified plaque. 5. Intracranial atherosclerosis without significant stenosis of major vessels. 6. Bilateral partial mastoid opacification. Electronically Signed   By: Monte Fantasia M.D.   On: 07/18/2020 19:51   VAS [place] ABI WITH/WO TBI  Result Date:  07/17/2020 LOWER EXTREMITY DOPPLER STUDY Indications: Claudication, and peripheral artery disease.  Vascular Interventions: 05/27/12: Bilateral common iliac artery PTA/stents;  01/19/14: distal aorta & bilateral common iliac artery                         angioplasties. Comparison Study: 07/08/2019 Performing Technologist: Concha Norway RVT  Examination Guidelines: A complete evaluation includes at minimum, Doppler waveform signals and systolic blood pressure reading at the level of bilateral brachial, anterior tibial, and posterior tibial arteries, when vessel segments are accessible. Bilateral testing is considered an integral part of a complete examination. Photoelectric Plethysmograph (PPG) waveforms and toe systolic pressure readings are included as required and additional duplex testing as needed. Limited examinations for reoccurring indications may be performed as noted.  ABI Findings: +---------+------------------+-----+---------+--------+ Right    Rt Pressure (mmHg)IndexWaveform Comment  +---------+------------------+-----+---------+--------+ Brachial 150                                      +---------+------------------+-----+---------+--------+ ATA      153               1.02 triphasic         +---------+------------------+-----+---------+--------+ PTA      154               1.03 triphasic         +---------+------------------+-----+---------+--------+ Great Toe149               0.99 Normal            +---------+------------------+-----+---------+--------+ +---------+------------------+-----+---------+-------+ Left     Lt Pressure (mmHg)IndexWaveform Comment +---------+------------------+-----+---------+-------+ Brachial 150                                     +---------+------------------+-----+---------+-------+ ATA      154               1.03 triphasic        +---------+------------------+-----+---------+-------+ PTA      165                1.10 triphasic        +---------+------------------+-----+---------+-------+ Great Toe160               1.07 Normal           +---------+------------------+-----+---------+-------+ +-------+-----------+-----------+------------+------------+ ABI/TBIToday's ABIToday's TBIPrevious ABIPrevious TBI +-------+-----------+-----------+------------+------------+ Right  1.03       .99        1.05        .97          +-------+-----------+-----------+------------+------------+ Left   1.10       1.07       1.02        .95          +-------+-----------+-----------+------------+------------+ Bilateral ABIs and TBIs appear essentially unchanged compared to prior study on 07/08/2019.  Summary: Right: Resting right ankle-brachial index is within normal range. No evidence of significant right lower extremity arterial disease. The right toe-brachial index is normal. Left: Resting left ankle-brachial index is within normal range. No evidence of significant left lower extremity arterial disease. The left toe-brachial index is normal.  *See table(s) above for measurements and observations.  Electronically signed by Leotis Pain MD on 07/17/2020 at 5:23:55 PM.    Final    VAS US CAROTID  Result Date: 07/17/2020 Carotid Arterial Duplex Study Indications:   Left endarterectomy and CAS. Other Factors: Left CEA 2015. Performing Technologist: Coralyn Mark  Knight RVT  Examination Guidelines: A complete evaluation includes B-mode imaging, spectral Doppler, color Doppler, and power Doppler as needed of all accessible portions of each vessel. Bilateral testing is considered an integral part of a complete examination. Limited examinations for reoccurring indications may be performed as noted.  Right Carotid Findings: +----------+--------+--------+--------+-------------------------+--------+           PSV cm/sEDV cm/sStenosisPlaque Description       Comments  +----------+--------+--------+--------+-------------------------+--------+ CCA Prox  69      17                                                +----------+--------+--------+--------+-------------------------+--------+ CCA Mid   68      21                                                +----------+--------+--------+--------+-------------------------+--------+ CCA Distal85      26                                                +----------+--------+--------+--------+-------------------------+--------+ ICA Prox  181     45      40-59%  heterogenous and calcific         +----------+--------+--------+--------+-------------------------+--------+ ICA Mid   127     41                                                +----------+--------+--------+--------+-------------------------+--------+ ICA Distal158     54                                                +----------+--------+--------+--------+-------------------------+--------+ ECA       139     18                                                +----------+--------+--------+--------+-------------------------+--------+ +----------+--------+-------+----------------+-------------------+           PSV cm/sEDV cmsDescribe        Arm Pressure (mmHG) +----------+--------+-------+----------------+-------------------+ Subclavian182            Multiphasic, WNL                    +----------+--------+-------+----------------+-------------------+ +---------+--------+--+--------+---------+ VertebralPSV cm/s43EDV cm/sAntegrade +---------+--------+--+--------+---------+  Left Carotid Findings: +----------+--------+--------+--------+------------------+--------+           PSV cm/sEDV cm/sStenosisPlaque DescriptionComments +----------+--------+--------+--------+------------------+--------+ CCA Prox  88      26                                          +----------+--------+--------+--------+------------------+--------+ CCA Mid   87      28                                         +----------+--------+--------+--------+------------------+--------+  CCA Distal73      23                                         +----------+--------+--------+--------+------------------+--------+ ICA Prox  332     108     80-99%                             +----------+--------+--------+--------+------------------+--------+ ICA Mid   257     98                                         +----------+--------+--------+--------+------------------+--------+ ICA Distal276     89                                         +----------+--------+--------+--------+------------------+--------+ ECA       98      13                                         +----------+--------+--------+--------+------------------+--------+ +----------+--------+--------+----------------+-------------------+           PSV cm/sEDV cm/sDescribe        Arm Pressure (mmHG) +----------+--------+--------+----------------+-------------------+ Subclavian129             Multiphasic, WNL                    +----------+--------+--------+----------------+-------------------+ +---------+--------+--+--------+---------+ VertebralPSV cm/s52EDV cm/sAntegrade +---------+--------+--+--------+---------+   Summary: Right Carotid: Velocities in the right ICA are consistent with a 40-59%                stenosis. Mild to moderate heterogeneous plaque in the ICA                suggesting stenosis in the high end of 40-59%. Left Carotid: Velocities in the left ICA are consistent with a 80-99% stenosis.               Significant narrowing and high velocities in the ICA suggesting an               80-99% restenosis of the ICA status post CEA in 2015. Vertebrals:  Bilateral vertebral arteries demonstrate antegrade flow. Subclavians: Normal flow hemodynamics were seen in bilateral subclavian               arteries. *See table(s) above for measurements and observations.  Electronically signed by Leotis Pain MD on 07/17/2020 at 5:23:47 PM.    Final    VAS US AORTA/IVC/ILIACS  Result Date: 07/17/2020 ABDOMINAL AORTA STUDY Vascular Interventions: 05/27/12: Bilateral common iliac artery PTA/stents;                         01/19/14: distal aorta & bilateral common iliac artery                         angioplasties.  Comparison Study: 07/08/2019 Performing Technologist: Concha Norway RVT  Examination Guidelines: A complete evaluation includes B-mode imaging, spectral Doppler, color Doppler, and power Doppler as needed of all accessible portions of each  vessel. Bilateral testing is considered an integral part of a complete examination. Limited examinations for reoccurring indications may be performed as noted.  Abdominal Aorta Findings: +-------------+-------+----------+----------+--------+--------+--------+ Location     AP (cm)Trans (cm)PSV (cm/s)WaveformThrombusComments +-------------+-------+----------+----------+--------+--------+--------+ Proximal     1.89   1.94      89        biphasic                 +-------------+-------+----------+----------+--------+--------+--------+ Mid                           102       biphasic                 +-------------+-------+----------+----------+--------+--------+--------+ Distal       1.86   1.89      113       biphasic                 +-------------+-------+----------+----------+--------+--------+--------+ RT CIA Prox  1.2    1.4       178       biphasic                 +-------------+-------+----------+----------+--------+--------+--------+ RT CIA Mid                    177       biphasic                 +-------------+-------+----------+----------+--------+--------+--------+ RT CIA Distal                 147       biphasic                 +-------------+-------+----------+----------+--------+--------+--------+ RT EIA  Prox                   132       biphasic                 +-------------+-------+----------+----------+--------+--------+--------+ RT EIA Mid                    120       biphasic                 +-------------+-------+----------+----------+--------+--------+--------+ RT EIA Distal                 94        biphasic                 +-------------+-------+----------+----------+--------+--------+--------+ LT CIA Prox  1.3    1.4       155       biphasic                 +-------------+-------+----------+----------+--------+--------+--------+ LT CIA Mid                    176       biphasic                 +-------------+-------+----------+----------+--------+--------+--------+ LT CIA Distal                 147       biphasic                 +-------------+-------+----------+----------+--------+--------+--------+ LT EIA Prox                   100       biphasic                 +-------------+-------+----------+----------+--------+--------+--------+  LT EIA Mid                    107       biphasic                 +-------------+-------+----------+----------+--------+--------+--------+ LT EIA Distal                 110       biphasic                 +-------------+-------+----------+----------+--------+--------+--------+  Summary: Abdominal Aorta: No evidence of an abdominal aortic aneurysm was visualized. The largest aortic measurement is 1.9 cm. Moderate atherosclerosis throughout. Distal aorta does not appear dilated. Evidence of patent stents bilaterally with no significant stenosis.  *See table(s) above for measurements and observations.  Electronically signed by Leotis Pain MD on 07/17/2020 at 5:23:57 PM.    Final     Assessment/Plan Hypertension blood pressure control important in reducing the progression of atherosclerotic disease. On appropriate oral medications.   Aortic atherosclerosis ([place]) Status post aortoiliac intervention  several years ago and doing well.  Hyperlipidemia lipid control important in reducing the progression of atherosclerotic disease. Continue statin therapy  PAD (peripheral artery disease) ([place]) Many years status post intervention.  Recently checked and being followed.  No change in medical regimen.  No role for intervention at this time.  Carotid artery stenosis He has undergone a CT angiogram which I have independently reviewed.  The official report is of a 50% right ICA stenosis and a 60% ulcerated lesion in the left carotid artery.  This does not correlate with a duplex which suggested a high-grade left carotid artery stenosis.  I believe the degree of stenosis is significantly worse in the 60% left ICA stenosis that has been reported and would be closer to 80% by my review. Given these findings, the patient should undergo a catheter-based angiogram of the left carotid artery to help differentiate between the disparate findings between duplex and CT scan.  If indeed a high-grade recurrent stenosis is identified, proceeding with intervention at that time would certainly be a reasonable option.  For recurrent stenosis, endovascular therapy would be highly preferred over open surgical therapy if the anatomy is amenable and it appears to be.  The patient also has significant heart disease and is not a great candidate for surgery with general anesthesia.  Surgery would also be a redo surgery markedly complicating the situation.  The patient has had syncopal episodes and potential TIA symptoms although it is difficult to differentiate between that and his coronary disease.  The patient and his wife voiced their understanding.  This has been scheduled for 08/20/2020.    Leotis Pain, MD  08/07/2020 2:46 PM    This note was created with Dragon medical transcription system.  Any errors from dictation are purely unintentional

## 2020-08-07 NOTE — H&P (View-Only) (Signed)
MRN : [id number]  Charles Blake is a 64 y.o. (08-08-1956) male who presents with chief complaint of  Chief Complaint  Patient presents with  . Follow-up    ct results  .  History of Present Illness: Patient returns today in follow up of carotid disease.  He has had one episode of near syncope with chest pain since his last visit.  He was outside in the heat and there was concern he may have had a heart attack.  He went to the hospital and was ruled out for MI.  He does have significant coronary disease which is known.  He has undergone a CT angiogram which I have independently reviewed.  The official report is of a 50% right ICA stenosis and a 60% ulcerated lesion in the left carotid artery.  This does not correlate with a duplex which suggested a high-grade left carotid artery stenosis.  I believe the degree of stenosis is significantly worse in the 60% left ICA stenosis that has been reported and would be closer to 80% by my review.  Current Outpatient Medications  Medication Sig Dispense Refill  . acetaminophen (TYLENOL) 650 MG CR tablet Take 650 mg by mouth every 8 (eight) hours as needed for pain.    Marland Kitchen albuterol (PROVENTIL HFA;VENTOLIN HFA) 108 (90 Base) MCG/ACT inhaler Inhale 2 puffs into the lungs every 4 (four) hours as needed for wheezing. 1 Inhaler 0  . aspirin 81 MG tablet Take 81 mg by mouth daily. am    . carvedilol (COREG) 3.125 MG tablet Take 3.125 mg by mouth 2 (two) times daily with a meal. Am and pm    . clonazePAM (KLONOPIN) 0.5 MG tablet Take 0.5 mg by mouth as needed.     Marland Kitchen escitalopram (LEXAPRO) 20 MG tablet Take 20 mg by mouth daily. Am    . fluconazole (DIFLUCAN) 200 MG tablet Take 200 mg by mouth once a week.     . fluticasone (FLONASE) 50 MCG/ACT nasal spray Place into the nose.    . furosemide (LASIX) 20 MG tablet Take by mouth.    . isosorbide mononitrate (IMDUR) 60 MG 24 hr tablet Take by mouth.    Marland Kitchen ketoconazole (NIZORAL) 2 % shampoo APPLY TOPICALLY  DIRECTLY TO SCALP EVERY OTHER DAY ALLOW TO SIT FOR 30 60 MINUTES BEFORE RINSING    . lovastatin (MEVACOR) 20 MG tablet Take 20 mg by mouth at bedtime.    . Melatonin 3 MG CAPS Take by mouth. pm    . Multiple Vitamin (MULTI-VITAMINS) TABS Take by mouth.    . nitroGLYCERIN (NITROSTAT) 0.4 MG SL tablet SMARTSIG:1 Tablet(s) Sublingual PRN    . Omega-3 Fatty Acids (FISH OIL) 1000 MG CAPS Take by mouth daily. am    . pantoprazole (PROTONIX) 40 MG tablet Take 40 mg by mouth daily. am    . pravastatin (PRAVACHOL) 80 MG tablet Take 80 mg by mouth daily.    . tadalafil (CIALIS) 20 MG tablet Take 20 mg by mouth daily as needed for erectile dysfunction.    Marland Kitchen telmisartan-hydrochlorothiazide (MICARDIS HCT) 80-12.5 MG tablet Take 1 tablet by mouth daily.  3  . tiotropium (SPIRIVA) 18 MCG inhalation capsule Place 18 mcg into inhaler and inhale daily. am    . traMADol (ULTRAM) 50 MG tablet Take 1 tablet (50 mg total) by mouth every 6 (six) hours as needed for moderate pain. 12 tablet 0  . amLODipine (NORVASC) 10 MG tablet Take 10 mg by mouth daily.  am (Patient not taking: Reported on 07/10/2020)    . atorvastatin (LIPITOR) 40 MG tablet Take 40 mg by mouth. am (Patient not taking: Reported on 07/10/2020)    . cyclobenzaprine (FLEXERIL) 10 MG tablet Take 1 tablet (10 mg total) by mouth 3 (three) times daily as needed. (Patient not taking: Reported on 07/08/2019) 15 tablet 0  . Doxycycline Hyclate 200 MG TBEC  (Patient not taking: Reported on 07/10/2020)  0  . hydrochlorothiazide (HYDRODIURIL) 25 MG tablet  (Patient not taking: Reported on 07/10/2020)  6  . HYDROcodone-acetaminophen (NORCO/VICODIN) 5-325 MG tablet Take 1 tablet by mouth every 4 (four) hours as needed. (Patient not taking: Reported on 07/08/2019) 15 tablet 0  . ketorolac (TORADOL) 10 MG tablet Take 1 tablet (10 mg total) by mouth every 6 (six) hours as needed. (Patient not taking: Reported on 07/08/2019) 20 tablet 0  . losartan-hydrochlorothiazide (HYZAAR)  100-25 MG per tablet Take 1 tablet by mouth daily. am (Patient not taking: Reported on 07/10/2020)     No current facility-administered medications for this visit.    Past Medical History:  Diagnosis Date  . Anemia   . Anxiety   . Arthritis    fingers and hands  . Cancer ([place])    skin cancer  . COPD (chronic obstructive pulmonary disease) ([facility])    wheezing  . Coronary artery disease   . Dyspnea   . GERD (gastroesophageal reflux disease)   . Headache    2-3 times/ week  . History of hiatal hernia   . HOH (hard of hearing)   . Hypercholesteremia   . Hypertension    controlled  . Pneumonia   . Sleep apnea    CPAP  . Wears dentures    upper    Past Surgical History:  Procedure Laterality Date  . APPENDECTOMY    . CARDIAC CATHETERIZATION    . CAROTID ENDARTERECTOMY Left   . CATARACT EXTRACTION Left   . CATARACT EXTRACTION W/PHACO Right 05/24/2018   Procedure: CATARACT EXTRACTION PHACO AND INTRAOCULAR LENS PLACEMENT ([place]) RIGHT;  Surgeon: Eulogio Bear, MD;  Location: [facility];  Service: Ophthalmology;  Laterality: Right;  CPAP, sleep apnea  . COLONOSCOPY N/A 05/29/2015   Procedure: COLONOSCOPY;  Surgeon: Lollie Sails, MD;  Location: [facility] ENDOSCOPY;  Service: Endoscopy;  Laterality: N/A;  . iliac stents    . TONSILLECTOMY       Social History   Tobacco Use  . Smoking status: Current Every Day Smoker    Packs/day: 0.50    Years: 40.00    Pack years: 20.00    Types: Cigarettes  . Smokeless tobacco: Former Network engineer Use Topics  . Alcohol use: Yes    Comment: rarely  . Drug use: Never     Family History  Problem Relation Age of Onset  . Heart attack Mother   . Hypertension Mother   . Kidney disease Mother   . Heart attack Father   . Hypertension Father   . Colon cancer Father   . Heart attack Brother     Allergies  Allergen Reactions  . Rosuvastatin     Other reaction(s): Other (See Comments) Extreme joint pain & fatigue      REVIEW OF SYSTEMS(Negative unless checked)  Constitutional: [] ??Weight loss[] ??Fever[] ??Chills Cardiac:[] ??Chest pain[] ??Chest pressure[] ??Palpitations [] ??Shortness of breath when laying flat [] ??Shortness of breath at rest [] ??Shortness of breath with exertion. Vascular: [] ??Pain in legs with walking[] ??Pain in legsat rest[] ??Pain in legs when laying flat [] ??Claudication [] ??Pain in feet  when walking [] ??Pain in feet at rest [] ??Pain in feet when laying flat [] ??History of DVT [] ??Phlebitis [] ??Swelling in legs [] ??Varicose veins [] ??Non-healing ulcers Pulmonary: [] ??Uses home oxygen [] ??Productive cough[] ??Hemoptysis [] ??Wheeze [] ??COPD [] ??Asthma Neurologic: [] ??Dizziness [] ??Blackouts [] ??Seizures [] ??History of stroke [] ??History of TIA[] ??Aphasia [] ??Temporary blindness[] ??Dysphagia [] ??Weaknessor numbness in arms [] ??Weakness or numbnessin legs Musculoskeletal: [x] ??Arthritis [] ??Joint swelling [] ??Joint pain [] ??Low back pain Hematologic:[] ??Easy bruising[] ??Easy bleeding [] ??Hypercoagulable state [] ??Anemic  Gastrointestinal:[] ??Blood in stool[] ??Vomiting blood[x] ??Gastroesophageal reflux/heartburn[] ??Abdominal pain Genitourinary: [] ??Chronic kidney disease [] ??Difficulturination [] ??Frequenturination [] ??Burning with urination[] ??Hematuria Skin: [] ??Rashes [] ??Ulcers [] ??Wounds Psychological: [] ??History of anxiety[] ??History of major depression.   Physical Examination  BP (!) 174/90 (BP Location: Right Arm)   Pulse 73   Resp 16   Wt 231 lb 12.8 oz (105.1 kg)   BMI 36.31 kg/m  Gen:  WD/WN, NAD Head: [place]/AT, No temporalis wasting. Ear/Nose/Throat: Hearing grossly intact, nares w/o erythema or drainage Eyes: Conjunctiva clear. Sclera non-icteric Neck: Supple.  Trachea midline Pulmonary:  Good air movement, no use of accessory muscles.  Cardiac: RRR, no  JVD Vascular:  Vessel Right Left  Radial Palpable Palpable               Musculoskeletal: M/S 5/5 throughout.  No deformity or atrophy.  Mild lower extremity edema. Neurologic: Sensation grossly intact in extremities.  Symmetrical.  Speech is fluent.  Psychiatric: Judgment intact, Mood & affect appropriate for pt's clinical situation. Dermatologic: No rashes or ulcers noted.  No cellulitis or open wounds.       Labs Recent Results (from the past 2160 hour(s))  BUN     Status: None   Collection Time: 07/18/20  9:03 AM  Result Value Ref Range   BUN 19 8 - 23 mg/dL    Comment: Performed at [facility], [address], [place], [place] [address]  Creatinine, serum     Status: None   Collection Time: 07/18/20  9:03 AM  Result Value Ref Range   Creatinine, Ser 1.05 0.61 - 1.24 mg/dL   GFR calc non Af Amer >60 >60 mL/min   GFR calc Af Amer >60 >60 mL/min    Comment: Performed at [facility], [address] [place], [place] [address]  Basic metabolic panel     Status: None   Collection Time: 08/03/20  9:35 PM  Result Value Ref Range   Sodium 141 135 - 145 mmol/L   Potassium 4.2 3.5 - 5.1 mmol/L   Chloride 104 98 - 111 mmol/L   CO2 29 22 - 32 mmol/L   Glucose, Bld 99 70 - 99 mg/dL    Comment: Glucose reference range applies only to samples taken after fasting for at least 8 hours.   BUN 17 8 - 23 mg/dL   Creatinine, Ser 1.16 0.61 - 1.24 mg/dL   Calcium 8.9 8.9 - 10.3 mg/dL   GFR calc non Af Amer >60 >60 mL/min   GFR calc Af Amer >60 >60 mL/min   Anion gap 8 5 - 15    Comment: Performed at [facility], [address] [place], [place] [address]  CBC     Status: Abnormal   Collection Time: 08/03/20  9:35 PM  Result Value Ref Range   WBC 11.1 (H) 4.0 - 10.5 K/uL   RBC 4.20 (L) 4.22 - 5.81 MIL/uL   Hemoglobin 13.9 13.0 - 17.0 g/dL   HCT 39.9 39 - 52 %   MCV 95.0 80.0 - 100.0 fL   MCH 33.1 26.0 - 34.0 pg   MCHC 34.8  30.0 - 36.0 g/dL    RDW 13.6 11.5 - 15.5 %   Platelets 226 150 - 400 K/uL   nRBC 0.0 0.0 - 0.2 %    Comment: Performed at [facility], [place], [place] [address]  Troponin I (High Sensitivity)     Status: None   Collection Time: 08/03/20  9:35 PM  Result Value Ref Range   Troponin I (High Sensitivity) 14 <18 ng/L    Comment: (NOTE) Elevated high sensitivity troponin I (hsTnI) values and significant  changes across serial measurements may suggest ACS but many other  chronic and acute conditions are known to elevate hsTnI results.  Refer to the "Links" section for chest pain algorithms and additional  guidance. Performed at [facility], [address] [place], [place] [address]     Radiology CT ANGIO HEAD W OR WO CONTRAST  Result Date: 07/18/2020 CLINICAL DATA:  Carotid artery blockage EXAM: CT ANGIOGRAPHY HEAD AND NECK TECHNIQUE: Multidetector CT imaging of the head and neck was performed using the standard protocol during bolus administration of intravenous contrast. Multiplanar CT image reconstructions and MIPs were obtained to evaluate the vascular anatomy. Carotid stenosis measurements (when applicable) are obtained utilizing NASCET criteria, using the distal internal carotid diameter as the denominator. CONTRAST:  20mL OMNIPAQUE IOHEXOL 350 MG/ML SOLN COMPARISON:  01/13/2014 CTA of the neck FINDINGS: CT HEAD FINDINGS Brain: No evidence of acute infarction, hemorrhage, hydrocephalus, extra-axial collection or mass lesion/mass effect. Mild low-density in the cerebral white matter attributed to chronic small vessel ischemia. Vascular: See below Skull: Normal. Negative for fracture or focal lesion. Sinuses: Clear Orbits: Bilateral cataract resection. Review of the MIP images confirms the above findings CTA NECK FINDINGS Aortic arch: Plaque without aneurysm or stenosis where covered. Right carotid system: Mixed density plaque at the common carotid bifurcation and proximal  ICA. ICA origin stenosis measures 50% based on sagittal measurement. No ulceration or dissection is seen. Left carotid system: Low-density plaque at the distal common carotid extending into both the proximal ICA and ECA with proximal ICA stenosis measuring 60% based on axial and reformat measurement. There is positive remodeling with a notable posterior outpouching at the level of the bulb which could be thrombogenic. Negative for dissection. Vertebral arteries: Proximal subclavian atherosclerosis without flow limiting stenosis. Heavily calcified right vertebral origin with subjective high-grade stenosis, the lumen is not measurable due to the degree of calcified plaque blooming. On the left, vertebral origin stenosis measures 50%. Both vertebral arteries are symmetrically enhancing at the dura. Skeleton: No acute or aggressive finding Other neck: Bilateral partial mastoid opacification with hypertrophic appearance of the adenoid. No nasopharyngeal asymmetry or worrisome enhancement. Upper chest: No acute finding Review of the MIP images confirms the above findings CTA HEAD FINDINGS Anterior circulation: Bilateral calcified plaque along the carotid siphons. No flow reducing stenosis, branch occlusion, or beading. Negative for aneurysm. Posterior circulation: Robust flow in the vertebral arteries with unremarkable cerebellar branching. The basilar is widely patent. No branch occlusion, beading, or aneurysm. Venous sinuses: Patent Anatomic variants: None significant Review of the MIP images confirms the above findings IMPRESSION: 1. Advanced atherosclerosis in the neck. 2. Low-density plaque at the left cervical bifurcation causes 60% proximal ICA stenosis and is complicated by a plaque ulceration. 3. 50% proximal right ICA stenosis. 4. Subjectively advanced right vertebral origin stenosis, quantification not possible due to the degree of calcified plaque. 5. Intracranial atherosclerosis without significant stenosis  of major vessels. 6. Bilateral partial mastoid opacification.  Electronically Signed   By: Monte Fantasia M.D.   On: 07/18/2020 19:51   DG Chest 2 View  Result Date: 08/03/2020 CLINICAL DATA:  Chest pain and shortness of breath EXAM: CHEST - 2 VIEW COMPARISON:  02/02/2017 FINDINGS: The heart size and mediastinal contours are within normal limits. Both lungs are mildly hyperinflated. The visualized skeletal structures are unremarkable. IMPRESSION: COPD without acute abnormality. Electronically Signed   By: Inez Catalina M.D.   On: 08/03/2020 22:10   CT ANGIO NECK W OR WO CONTRAST  Result Date: 07/18/2020 CLINICAL DATA:  Carotid artery blockage EXAM: CT ANGIOGRAPHY HEAD AND NECK TECHNIQUE: Multidetector CT imaging of the head and neck was performed using the standard protocol during bolus administration of intravenous contrast. Multiplanar CT image reconstructions and MIPs were obtained to evaluate the vascular anatomy. Carotid stenosis measurements (when applicable) are obtained utilizing NASCET criteria, using the distal internal carotid diameter as the denominator. CONTRAST:  24mL OMNIPAQUE IOHEXOL 350 MG/ML SOLN COMPARISON:  01/13/2014 CTA of the neck FINDINGS: CT HEAD FINDINGS Brain: No evidence of acute infarction, hemorrhage, hydrocephalus, extra-axial collection or mass lesion/mass effect. Mild low-density in the cerebral white matter attributed to chronic small vessel ischemia. Vascular: See below Skull: Normal. Negative for fracture or focal lesion. Sinuses: Clear Orbits: Bilateral cataract resection. Review of the MIP images confirms the above findings CTA NECK FINDINGS Aortic arch: Plaque without aneurysm or stenosis where covered. Right carotid system: Mixed density plaque at the common carotid bifurcation and proximal ICA. ICA origin stenosis measures 50% based on sagittal measurement. No ulceration or dissection is seen. Left carotid system: Low-density plaque at the distal common carotid  extending into both the proximal ICA and ECA with proximal ICA stenosis measuring 60% based on axial and reformat measurement. There is positive remodeling with a notable posterior outpouching at the level of the bulb which could be thrombogenic. Negative for dissection. Vertebral arteries: Proximal subclavian atherosclerosis without flow limiting stenosis. Heavily calcified right vertebral origin with subjective high-grade stenosis, the lumen is not measurable due to the degree of calcified plaque blooming. On the left, vertebral origin stenosis measures 50%. Both vertebral arteries are symmetrically enhancing at the dura. Skeleton: No acute or aggressive finding Other neck: Bilateral partial mastoid opacification with hypertrophic appearance of the adenoid. No nasopharyngeal asymmetry or worrisome enhancement. Upper chest: No acute finding Review of the MIP images confirms the above findings CTA HEAD FINDINGS Anterior circulation: Bilateral calcified plaque along the carotid siphons. No flow reducing stenosis, branch occlusion, or beading. Negative for aneurysm. Posterior circulation: Robust flow in the vertebral arteries with unremarkable cerebellar branching. The basilar is widely patent. No branch occlusion, beading, or aneurysm. Venous sinuses: Patent Anatomic variants: None significant Review of the MIP images confirms the above findings IMPRESSION: 1. Advanced atherosclerosis in the neck. 2. Low-density plaque at the left cervical bifurcation causes 60% proximal ICA stenosis and is complicated by a plaque ulceration. 3. 50% proximal right ICA stenosis. 4. Subjectively advanced right vertebral origin stenosis, quantification not possible due to the degree of calcified plaque. 5. Intracranial atherosclerosis without significant stenosis of major vessels. 6. Bilateral partial mastoid opacification. Electronically Signed   By: Monte Fantasia M.D.   On: 07/18/2020 19:51   VAS [place] ABI WITH/WO TBI  Result Date:  07/17/2020 LOWER EXTREMITY DOPPLER STUDY Indications: Claudication, and peripheral artery disease.  Vascular Interventions: 05/27/12: Bilateral common iliac artery PTA/stents;  01/19/14: distal aorta & bilateral common iliac artery                         angioplasties. Comparison Study: 07/08/2019 Performing Technologist: Concha Norway RVT  Examination Guidelines: A complete evaluation includes at minimum, Doppler waveform signals and systolic blood pressure reading at the level of bilateral brachial, anterior tibial, and posterior tibial arteries, when vessel segments are accessible. Bilateral testing is considered an integral part of a complete examination. Photoelectric Plethysmograph (PPG) waveforms and toe systolic pressure readings are included as required and additional duplex testing as needed. Limited examinations for reoccurring indications may be performed as noted.  ABI Findings: +---------+------------------+-----+---------+--------+ Right    Rt Pressure (mmHg)IndexWaveform Comment  +---------+------------------+-----+---------+--------+ Brachial 150                                      +---------+------------------+-----+---------+--------+ ATA      153               1.02 triphasic         +---------+------------------+-----+---------+--------+ PTA      154               1.03 triphasic         +---------+------------------+-----+---------+--------+ Great Toe149               0.99 Normal            +---------+------------------+-----+---------+--------+ +---------+------------------+-----+---------+-------+ Left     Lt Pressure (mmHg)IndexWaveform Comment +---------+------------------+-----+---------+-------+ Brachial 150                                     +---------+------------------+-----+---------+-------+ ATA      154               1.03 triphasic        +---------+------------------+-----+---------+-------+ PTA      165                1.10 triphasic        +---------+------------------+-----+---------+-------+ Great Toe160               1.07 Normal           +---------+------------------+-----+---------+-------+ +-------+-----------+-----------+------------+------------+ ABI/TBIToday's ABIToday's TBIPrevious ABIPrevious TBI +-------+-----------+-----------+------------+------------+ Right  1.03       .99        1.05        .97          +-------+-----------+-----------+------------+------------+ Left   1.10       1.07       1.02        .95          +-------+-----------+-----------+------------+------------+ Bilateral ABIs and TBIs appear essentially unchanged compared to prior study on 07/08/2019.  Summary: Right: Resting right ankle-brachial index is within normal range. No evidence of significant right lower extremity arterial disease. The right toe-brachial index is normal. Left: Resting left ankle-brachial index is within normal range. No evidence of significant left lower extremity arterial disease. The left toe-brachial index is normal.  *See table(s) above for measurements and observations.  Electronically signed by Leotis Pain MD on 07/17/2020 at 5:23:55 PM.    Final    VAS US CAROTID  Result Date: 07/17/2020 Carotid Arterial Duplex Study Indications:   Left endarterectomy and CAS. Other Factors: Left CEA 2015. Performing Technologist: Coralyn Mark  Knight RVT  Examination Guidelines: A complete evaluation includes B-mode imaging, spectral Doppler, color Doppler, and power Doppler as needed of all accessible portions of each vessel. Bilateral testing is considered an integral part of a complete examination. Limited examinations for reoccurring indications may be performed as noted.  Right Carotid Findings: +----------+--------+--------+--------+-------------------------+--------+           PSV cm/sEDV cm/sStenosisPlaque Description       Comments  +----------+--------+--------+--------+-------------------------+--------+ CCA Prox  69      17                                                +----------+--------+--------+--------+-------------------------+--------+ CCA Mid   68      21                                                +----------+--------+--------+--------+-------------------------+--------+ CCA Distal85      26                                                +----------+--------+--------+--------+-------------------------+--------+ ICA Prox  181     45      40-59%  heterogenous and calcific         +----------+--------+--------+--------+-------------------------+--------+ ICA Mid   127     41                                                +----------+--------+--------+--------+-------------------------+--------+ ICA Distal158     54                                                +----------+--------+--------+--------+-------------------------+--------+ ECA       139     18                                                +----------+--------+--------+--------+-------------------------+--------+ +----------+--------+-------+----------------+-------------------+           PSV cm/sEDV cmsDescribe        Arm Pressure (mmHG) +----------+--------+-------+----------------+-------------------+ Subclavian182            Multiphasic, WNL                    +----------+--------+-------+----------------+-------------------+ +---------+--------+--+--------+---------+ VertebralPSV cm/s43EDV cm/sAntegrade +---------+--------+--+--------+---------+  Left Carotid Findings: +----------+--------+--------+--------+------------------+--------+           PSV cm/sEDV cm/sStenosisPlaque DescriptionComments +----------+--------+--------+--------+------------------+--------+ CCA Prox  88      26                                          +----------+--------+--------+--------+------------------+--------+ CCA Mid   87      28                                         +----------+--------+--------+--------+------------------+--------+  CCA Distal73      23                                         +----------+--------+--------+--------+------------------+--------+ ICA Prox  332     108     80-99%                             +----------+--------+--------+--------+------------------+--------+ ICA Mid   257     98                                         +----------+--------+--------+--------+------------------+--------+ ICA Distal276     89                                         +----------+--------+--------+--------+------------------+--------+ ECA       98      13                                         +----------+--------+--------+--------+------------------+--------+ +----------+--------+--------+----------------+-------------------+           PSV cm/sEDV cm/sDescribe        Arm Pressure (mmHG) +----------+--------+--------+----------------+-------------------+ Subclavian129             Multiphasic, WNL                    +----------+--------+--------+----------------+-------------------+ +---------+--------+--+--------+---------+ VertebralPSV cm/s52EDV cm/sAntegrade +---------+--------+--+--------+---------+   Summary: Right Carotid: Velocities in the right ICA are consistent with a 40-59%                stenosis. Mild to moderate heterogeneous plaque in the ICA                suggesting stenosis in the high end of 40-59%. Left Carotid: Velocities in the left ICA are consistent with a 80-99% stenosis.               Significant narrowing and high velocities in the ICA suggesting an               80-99% restenosis of the ICA status post CEA in 2015. Vertebrals:  Bilateral vertebral arteries demonstrate antegrade flow. Subclavians: Normal flow hemodynamics were seen in bilateral subclavian               arteries. *See table(s) above for measurements and observations.  Electronically signed by Leotis Pain MD on 07/17/2020 at 5:23:47 PM.    Final    VAS US AORTA/IVC/ILIACS  Result Date: 07/17/2020 ABDOMINAL AORTA STUDY Vascular Interventions: 05/27/12: Bilateral common iliac artery PTA/stents;                         01/19/14: distal aorta & bilateral common iliac artery                         angioplasties.  Comparison Study: 07/08/2019 Performing Technologist: Concha Norway RVT  Examination Guidelines: A complete evaluation includes B-mode imaging, spectral Doppler, color Doppler, and power Doppler as needed of all accessible portions of each  vessel. Bilateral testing is considered an integral part of a complete examination. Limited examinations for reoccurring indications may be performed as noted.  Abdominal Aorta Findings: +-------------+-------+----------+----------+--------+--------+--------+ Location     AP (cm)Trans (cm)PSV (cm/s)WaveformThrombusComments +-------------+-------+----------+----------+--------+--------+--------+ Proximal     1.89   1.94      89        biphasic                 +-------------+-------+----------+----------+--------+--------+--------+ Mid                           102       biphasic                 +-------------+-------+----------+----------+--------+--------+--------+ Distal       1.86   1.89      113       biphasic                 +-------------+-------+----------+----------+--------+--------+--------+ RT CIA Prox  1.2    1.4       178       biphasic                 +-------------+-------+----------+----------+--------+--------+--------+ RT CIA Mid                    177       biphasic                 +-------------+-------+----------+----------+--------+--------+--------+ RT CIA Distal                 147       biphasic                 +-------------+-------+----------+----------+--------+--------+--------+ RT EIA  Prox                   132       biphasic                 +-------------+-------+----------+----------+--------+--------+--------+ RT EIA Mid                    120       biphasic                 +-------------+-------+----------+----------+--------+--------+--------+ RT EIA Distal                 94        biphasic                 +-------------+-------+----------+----------+--------+--------+--------+ LT CIA Prox  1.3    1.4       155       biphasic                 +-------------+-------+----------+----------+--------+--------+--------+ LT CIA Mid                    176       biphasic                 +-------------+-------+----------+----------+--------+--------+--------+ LT CIA Distal                 147       biphasic                 +-------------+-------+----------+----------+--------+--------+--------+ LT EIA Prox                   100       biphasic                 +-------------+-------+----------+----------+--------+--------+--------+  LT EIA Mid                    107       biphasic                 +-------------+-------+----------+----------+--------+--------+--------+ LT EIA Distal                 110       biphasic                 +-------------+-------+----------+----------+--------+--------+--------+  Summary: Abdominal Aorta: No evidence of an abdominal aortic aneurysm was visualized. The largest aortic measurement is 1.9 cm. Moderate atherosclerosis throughout. Distal aorta does not appear dilated. Evidence of patent stents bilaterally with no significant stenosis.  *See table(s) above for measurements and observations.  Electronically signed by Leotis Pain MD on 07/17/2020 at 5:23:57 PM.    Final     Assessment/Plan Hypertension blood pressure control important in reducing the progression of atherosclerotic disease. On appropriate oral medications.   Aortic atherosclerosis ([place]) Status post aortoiliac intervention  several years ago and doing well.  Hyperlipidemia lipid control important in reducing the progression of atherosclerotic disease. Continue statin therapy  PAD (peripheral artery disease) ([place]) Many years status post intervention.  Recently checked and being followed.  No change in medical regimen.  No role for intervention at this time.  Carotid artery stenosis He has undergone a CT angiogram which I have independently reviewed.  The official report is of a 50% right ICA stenosis and a 60% ulcerated lesion in the left carotid artery.  This does not correlate with a duplex which suggested a high-grade left carotid artery stenosis.  I believe the degree of stenosis is significantly worse in the 60% left ICA stenosis that has been reported and would be closer to 80% by my review. Given these findings, the patient should undergo a catheter-based angiogram of the left carotid artery to help differentiate between the disparate findings between duplex and CT scan.  If indeed a high-grade recurrent stenosis is identified, proceeding with intervention at that time would certainly be a reasonable option.  For recurrent stenosis, endovascular therapy would be highly preferred over open surgical therapy if the anatomy is amenable and it appears to be.  The patient also has significant heart disease and is not a great candidate for surgery with general anesthesia.  Surgery would also be a redo surgery markedly complicating the situation.  The patient has had syncopal episodes and potential TIA symptoms although it is difficult to differentiate between that and his coronary disease.  The patient and his wife voiced their understanding.  This has been scheduled for 08/20/2020.    Leotis Pain, MD  08/07/2020 2:46 PM    This note was created with Dragon medical transcription system.  Any errors from dictation are purely unintentional

## 2020-08-15 ENCOUNTER — Other Ambulatory Visit (INDEPENDENT_AMBULATORY_CARE_PROVIDER_SITE_OTHER): Payer: Self-pay | Admitting: Nurse Practitioner

## 2020-08-15 ENCOUNTER — Telehealth (INDEPENDENT_AMBULATORY_CARE_PROVIDER_SITE_OTHER): Payer: Self-pay

## 2020-08-15 NOTE — Telephone Encounter (Signed)
Spoke with the patient and he is scheduled with Dr. Lucky Cowboy for a left carotid stent placement with a 1:30 pm to the MM. Covid testing is on 08/16/20 between 8-1 pm at the [facility]. Pre-procedure instructions were discussed and will be mailed.

## 2020-08-16 ENCOUNTER — Other Ambulatory Visit
Admission: RE | Admit: 2020-08-16 | Discharge: 2020-08-16 | Disposition: A | Discharge status: Home / Self Care | Payer: Medicare Other | Source: Ambulatory Visit | Attending: Vascular Surgery | Admitting: Vascular Surgery

## 2020-08-16 ENCOUNTER — Other Ambulatory Visit: Payer: Self-pay

## 2020-08-16 DIAGNOSIS — Z20822 Contact with and (suspected) exposure to covid-19: Secondary | ICD-10-CM | POA: Diagnosis not present

## 2020-08-16 DIAGNOSIS — Z01812 Encounter for preprocedural laboratory examination: Secondary | ICD-10-CM | POA: Insufficient documentation

## 2020-08-17 LAB — SARS CORONAVIRUS 2 (TAT 6-24 HRS): SARS Coronavirus 2: NEGATIVE

## 2020-08-20 ENCOUNTER — Encounter: Payer: Self-pay | Admitting: Vascular Surgery

## 2020-08-20 ENCOUNTER — Other Ambulatory Visit (INDEPENDENT_AMBULATORY_CARE_PROVIDER_SITE_OTHER): Payer: Self-pay | Admitting: Nurse Practitioner

## 2020-08-20 ENCOUNTER — Encounter: Payer: Self-pay | Admitting: Anesthesiology

## 2020-08-20 ENCOUNTER — Other Ambulatory Visit: Payer: Self-pay

## 2020-08-20 ENCOUNTER — Inpatient Hospital Stay
Admission: RE | Admit: 2020-08-20 | Discharge: 2020-08-21 | DRG: 036 | Disposition: A | Discharge status: Home / Self Care | Payer: Medicare Other | Attending: Vascular Surgery | Admitting: Vascular Surgery

## 2020-08-20 ENCOUNTER — Encounter
Admission: RE | Disposition: A | Discharge status: Home / Self Care | Payer: Self-pay | Source: Home / Self Care | Attending: Vascular Surgery

## 2020-08-20 DIAGNOSIS — F17210 Nicotine dependence, cigarettes, uncomplicated: Secondary | ICD-10-CM | POA: Diagnosis present

## 2020-08-20 DIAGNOSIS — K219 Gastro-esophageal reflux disease without esophagitis: Secondary | ICD-10-CM | POA: Diagnosis present

## 2020-08-20 DIAGNOSIS — H9190 Unspecified hearing loss, unspecified ear: Secondary | ICD-10-CM | POA: Diagnosis present

## 2020-08-20 DIAGNOSIS — Z800 Family history of malignant neoplasm of digestive organs: Secondary | ICD-10-CM | POA: Diagnosis not present

## 2020-08-20 DIAGNOSIS — E785 Hyperlipidemia, unspecified: Secondary | ICD-10-CM | POA: Diagnosis present

## 2020-08-20 DIAGNOSIS — J449 Chronic obstructive pulmonary disease, unspecified: Secondary | ICD-10-CM | POA: Diagnosis present

## 2020-08-20 DIAGNOSIS — I6522 Occlusion and stenosis of left carotid artery: Secondary | ICD-10-CM | POA: Diagnosis present

## 2020-08-20 DIAGNOSIS — Z85828 Personal history of other malignant neoplasm of skin: Secondary | ICD-10-CM

## 2020-08-20 DIAGNOSIS — I6523 Occlusion and stenosis of bilateral carotid arteries: Principal | ICD-10-CM | POA: Diagnosis present

## 2020-08-20 DIAGNOSIS — Z79899 Other long term (current) drug therapy: Secondary | ICD-10-CM

## 2020-08-20 DIAGNOSIS — F419 Anxiety disorder, unspecified: Secondary | ICD-10-CM | POA: Diagnosis present

## 2020-08-20 DIAGNOSIS — Z7982 Long term (current) use of aspirin: Secondary | ICD-10-CM

## 2020-08-20 DIAGNOSIS — I700 Atherosclerosis of aorta: Secondary | ICD-10-CM | POA: Diagnosis present

## 2020-08-20 DIAGNOSIS — I2510 Atherosclerotic heart disease of native coronary artery without angina pectoris: Secondary | ICD-10-CM | POA: Diagnosis present

## 2020-08-20 DIAGNOSIS — Z8249 Family history of ischemic heart disease and other diseases of the circulatory system: Secondary | ICD-10-CM

## 2020-08-20 DIAGNOSIS — E7800 Pure hypercholesterolemia, unspecified: Secondary | ICD-10-CM | POA: Diagnosis present

## 2020-08-20 DIAGNOSIS — Z841 Family history of disorders of kidney and ureter: Secondary | ICD-10-CM

## 2020-08-20 DIAGNOSIS — I10 Essential (primary) hypertension: Secondary | ICD-10-CM | POA: Diagnosis present

## 2020-08-20 DIAGNOSIS — Z888 Allergy status to other drugs, medicaments and biological substances status: Secondary | ICD-10-CM | POA: Diagnosis not present

## 2020-08-20 DIAGNOSIS — I739 Peripheral vascular disease, unspecified: Secondary | ICD-10-CM | POA: Diagnosis present

## 2020-08-20 DIAGNOSIS — R55 Syncope and collapse: Secondary | ICD-10-CM | POA: Diagnosis present

## 2020-08-20 HISTORY — PX: CAROTID PTA/STENT INTERVENTION: CATH118231

## 2020-08-20 LAB — POCT ACTIVATED CLOTTING TIME: Activated Clotting Time: 224 seconds

## 2020-08-20 LAB — MRSA PCR SCREENING: MRSA by PCR: POSITIVE — AB

## 2020-08-20 LAB — BUN: BUN: 16 mg/dL (ref 8–23)

## 2020-08-20 LAB — CREATININE, SERUM
Creatinine, Ser: 1.1 mg/dL (ref 0.61–1.24)
GFR calc Af Amer: >60 mL/min (ref >60)
GFR calc non Af Amer: >60 mL/min (ref >60)

## 2020-08-20 SURGERY — CAROTID PTA/STENT INTERVENTION
Anesthesia: Moderate Sedation | Laterality: Left

## 2020-08-20 MED ORDER — FENTANYL CITRATE (PF) 100 MCG/2ML IJ SOLN
INTRAMUSCULAR | Status: AC
  Filled 2020-08-20: qty 2

## 2020-08-20 MED ORDER — AMLODIPINE BESYLATE 10 MG PO TABS
10.0000 mg | ORAL_TABLET | Freq: Every day | ORAL | Status: DC
  Administered 2020-08-21: 10 mg via ORAL
  Filled 2020-08-20 (×2): qty 1

## 2020-08-20 MED ORDER — ONDANSETRON HCL 4 MG/2ML IJ SOLN: 4.0000 mg | Freq: Four times a day (QID) | INTRAMUSCULAR | Status: DC | PRN

## 2020-08-20 MED ORDER — FENTANYL CITRATE (PF) 100 MCG/2ML IJ SOLN
INTRAMUSCULAR | Status: DC | PRN
Start: 2020-08-20 — End: 2020-08-20
  Administered 2020-08-20: 50 ug via INTRAVENOUS

## 2020-08-20 MED ORDER — SODIUM CHLORIDE 0.9 % IV SOLN: INTRAVENOUS | Status: AC

## 2020-08-20 MED ORDER — FAMOTIDINE IN NACL 20-0.9 MG/50ML-% IV SOLN
20.0000 mg | Freq: Two times a day (BID) | INTRAVENOUS | Status: DC
  Administered 2020-08-20: 20 mg via INTRAVENOUS
  Filled 2020-08-20 (×2): qty 50

## 2020-08-20 MED ORDER — OMEGA-3-ACID ETHYL ESTERS 1 G PO CAPS
1.0000 g | ORAL_CAPSULE | Freq: Every day | ORAL | Status: DC
  Administered 2020-08-21: 1 g via ORAL
  Filled 2020-08-20: qty 1

## 2020-08-20 MED ORDER — VANCOMYCIN HCL IN DEXTROSE 1-5 GM/200ML-% IV SOLN: 1000.0000 mg | Freq: Two times a day (BID) | INTRAVENOUS | Status: DC

## 2020-08-20 MED ORDER — SODIUM CHLORIDE 0.9 % IV SOLN: INTRAVENOUS | Status: DC

## 2020-08-20 MED ORDER — HYDROCODONE-ACETAMINOPHEN 5-325 MG PO TABS: 1.0000 | ORAL_TABLET | Freq: Four times a day (QID) | ORAL | Status: DC | PRN

## 2020-08-20 MED ORDER — DIPHENHYDRAMINE HCL 50 MG/ML IJ SOLN: 50.0000 mg | Freq: Once | INTRAMUSCULAR | Status: DC | PRN

## 2020-08-20 MED ORDER — FUROSEMIDE 20 MG PO TABS
20.0000 mg | ORAL_TABLET | Freq: Every day | ORAL | Status: DC
  Administered 2020-08-21: 20 mg via ORAL
  Filled 2020-08-20: qty 1

## 2020-08-20 MED ORDER — MAGNESIUM SULFATE 2 GM/50ML IV SOLN: 2.0000 g | Freq: Every day | INTRAVENOUS | Status: DC | PRN

## 2020-08-20 MED ORDER — ALUM & MAG HYDROXIDE-SIMETH 200-200-20 MG/5ML PO SUSP: 15.0000 mL | ORAL | Status: DC | PRN

## 2020-08-20 MED ORDER — PHENYLEPHRINE HCL (PRESSORS) 10 MG/ML IV SOLN
INTRAVENOUS | Status: AC
  Filled 2020-08-20: qty 1

## 2020-08-20 MED ORDER — METOPROLOL TARTRATE 5 MG/5ML IV SOLN: 2.0000 mg | INTRAVENOUS | Status: DC | PRN

## 2020-08-20 MED ORDER — ALBUTEROL SULFATE (2.5 MG/3ML) 0.083% IN NEBU: 3.0000 mL | INHALATION_SOLUTION | RESPIRATORY_TRACT | Status: DC | PRN

## 2020-08-20 MED ORDER — PHENOL 1.4 % MT LIQD
1.0000 | OROMUCOSAL | Status: DC | PRN
  Filled 2020-08-20: qty 177

## 2020-08-20 MED ORDER — FENTANYL CITRATE (PF) 100 MCG/2ML IJ SOLN
INTRAMUSCULAR | Status: DC
Start: 2020-08-20 — End: 2020-08-20
  Filled 2020-08-20: qty 2

## 2020-08-20 MED ORDER — PRAVASTATIN SODIUM 20 MG PO TABS: 20.0000 mg | ORAL_TABLET | Freq: Every day | ORAL | Status: DC

## 2020-08-20 MED ORDER — IRBESARTAN 150 MG PO TABS
300.0000 mg | ORAL_TABLET | Freq: Every day | ORAL | Status: DC
  Administered 2020-08-21: 300 mg via ORAL
  Filled 2020-08-20 (×2): qty 2

## 2020-08-20 MED ORDER — HYDROMORPHONE HCL 1 MG/ML IJ SOLN: 1.0000 mg | Freq: Once | INTRAMUSCULAR | Status: DC | PRN

## 2020-08-20 MED ORDER — FLUTICASONE PROPIONATE 50 MCG/ACT NA SUSP
1.0000 | Freq: Every day | NASAL | Status: DC
  Administered 2020-08-21: 1 via NASAL
  Filled 2020-08-20: qty 16

## 2020-08-20 MED ORDER — ATROPINE SULFATE 1 MG/10ML IJ SOSY
PREFILLED_SYRINGE | INTRAMUSCULAR | Status: DC | PRN
  Administered 2020-08-20: 1 mg via INTRAVENOUS

## 2020-08-20 MED ORDER — MUPIROCIN 2 % EX OINT
1.0000 "application " | TOPICAL_OINTMENT | Freq: Two times a day (BID) | CUTANEOUS | Status: DC
  Administered 2020-08-20 – 2020-08-21 (×2): 1 via NASAL
  Filled 2020-08-20: qty 22

## 2020-08-20 MED ORDER — CEFAZOLIN SODIUM-DEXTROSE 2-4 GM/100ML-% IV SOLN
2.0000 g | Freq: Three times a day (TID) | INTRAVENOUS | Status: AC
  Administered 2020-08-20 – 2020-08-21 (×2): 2 g via INTRAVENOUS
  Filled 2020-08-20 (×2): qty 100

## 2020-08-20 MED ORDER — ESCITALOPRAM OXALATE 10 MG PO TABS
20.0000 mg | ORAL_TABLET | Freq: Every day | ORAL | Status: DC
  Filled 2020-08-20 (×2): qty 2

## 2020-08-20 MED ORDER — PANTOPRAZOLE SODIUM 40 MG PO TBEC
40.0000 mg | DELAYED_RELEASE_TABLET | Freq: Every day | ORAL | Status: DC
  Administered 2020-08-21: 40 mg via ORAL
  Filled 2020-08-20: qty 1

## 2020-08-20 MED ORDER — DOPAMINE-DEXTROSE 3.2-5 MG/ML-% IV SOLN
INTRAVENOUS | Status: AC
  Filled 2020-08-20: qty 250

## 2020-08-20 MED ORDER — FAMOTIDINE 20 MG PO TABS: 40.0000 mg | ORAL_TABLET | Freq: Once | ORAL | Status: DC | PRN

## 2020-08-20 MED ORDER — OXYCODONE-ACETAMINOPHEN 5-325 MG PO TABS: 1.0000 | ORAL_TABLET | ORAL | Status: DC | PRN

## 2020-08-20 MED ORDER — MIDAZOLAM HCL 5 MG/5ML IJ SOLN
INTRAMUSCULAR | Status: AC
  Filled 2020-08-20: qty 5

## 2020-08-20 MED ORDER — CHLORHEXIDINE GLUCONATE CLOTH 2 % EX PADS
6.0000 | MEDICATED_PAD | Freq: Every day | CUTANEOUS | Status: DC
  Administered 2020-08-20: 6 via TOPICAL

## 2020-08-20 MED ORDER — HEPARIN SODIUM (PORCINE) 1000 UNIT/ML IJ SOLN
INTRAMUSCULAR | Status: AC
  Filled 2020-08-20: qty 1

## 2020-08-20 MED ORDER — MORPHINE SULFATE (PF) 4 MG/ML IV SOLN: 2.0000 mg | INTRAVENOUS | Status: DC | PRN

## 2020-08-20 MED ORDER — NITROGLYCERIN 0.4 MG SL SUBL: 0.4000 mg | SUBLINGUAL_TABLET | SUBLINGUAL | Status: DC | PRN

## 2020-08-20 MED ORDER — HYDROCHLOROTHIAZIDE 12.5 MG PO CAPS
12.5000 mg | ORAL_CAPSULE | Freq: Every day | ORAL | Status: DC
  Administered 2020-08-21: 12.5 mg via ORAL
  Filled 2020-08-20 (×2): qty 1

## 2020-08-20 MED ORDER — ATROPINE SULFATE 1 MG/10ML IJ SOSY
PREFILLED_SYRINGE | INTRAMUSCULAR | Status: AC
  Filled 2020-08-20: qty 10

## 2020-08-20 MED ORDER — ATORVASTATIN CALCIUM 20 MG PO TABS
40.0000 mg | ORAL_TABLET | Freq: Every day | ORAL | Status: DC
  Administered 2020-08-21: 40 mg via ORAL
  Filled 2020-08-20 (×2): qty 2

## 2020-08-20 MED ORDER — CLONAZEPAM 0.5 MG PO TABS: 0.5000 mg | ORAL_TABLET | Freq: Two times a day (BID) | ORAL | Status: DC | PRN

## 2020-08-20 MED ORDER — METHYLPREDNISOLONE SODIUM SUCC 125 MG IJ SOLR: 125.0000 mg | Freq: Once | INTRAMUSCULAR | Status: DC | PRN

## 2020-08-20 MED ORDER — CARVEDILOL 3.125 MG PO TABS
3.1250 mg | ORAL_TABLET | Freq: Two times a day (BID) | ORAL | Status: DC
  Administered 2020-08-21: 3.125 mg via ORAL
  Filled 2020-08-20: qty 1

## 2020-08-20 MED ORDER — POTASSIUM CHLORIDE CRYS ER 20 MEQ PO TBCR: 20.0000 meq | EXTENDED_RELEASE_TABLET | Freq: Every day | ORAL | Status: DC | PRN

## 2020-08-20 MED ORDER — HYDRALAZINE HCL 20 MG/ML IJ SOLN: 5.0000 mg | INTRAMUSCULAR | Status: DC | PRN

## 2020-08-20 MED ORDER — CLOPIDOGREL BISULFATE 75 MG PO TABS
75.0000 mg | ORAL_TABLET | Freq: Every day | ORAL | Status: DC
  Administered 2020-08-21: 75 mg via ORAL
  Filled 2020-08-20: qty 1

## 2020-08-20 MED ORDER — GUAIFENESIN-DM 100-10 MG/5ML PO SYRP: 15.0000 mL | ORAL_SOLUTION | ORAL | Status: DC | PRN

## 2020-08-20 MED ORDER — ACETAMINOPHEN 325 MG RE SUPP
325.0000 mg | RECTAL | Status: DC | PRN
  Filled 2020-08-20: qty 1

## 2020-08-20 MED ORDER — HYDROCODONE-ACETAMINOPHEN 5-325 MG PO TABS
1.0000 | ORAL_TABLET | ORAL | Status: DC | PRN
Start: 2020-08-20 — End: 2020-08-20

## 2020-08-20 MED ORDER — ASPIRIN 81 MG PO TABS: 81.0000 mg | ORAL_TABLET | Freq: Every day | ORAL | Status: DC

## 2020-08-20 MED ORDER — CLOPIDOGREL BISULFATE 75 MG PO TABS: 75.0000 mg | ORAL_TABLET | Freq: Every day | ORAL | Status: DC

## 2020-08-20 MED ORDER — ISOSORBIDE MONONITRATE ER 30 MG PO TB24
30.0000 mg | ORAL_TABLET | Freq: Every day | ORAL | Status: DC
  Administered 2020-08-21: 30 mg via ORAL
  Filled 2020-08-20: qty 1

## 2020-08-20 MED ORDER — VANCOMYCIN HCL 1500 MG/300ML IV SOLN
1500.0000 mg | Freq: Two times a day (BID) | INTRAVENOUS | Status: AC
  Administered 2020-08-20 – 2020-08-21 (×2): 1500 mg via INTRAVENOUS
  Filled 2020-08-20 (×2): qty 300

## 2020-08-20 MED ORDER — CEFAZOLIN SODIUM-DEXTROSE 2-4 GM/100ML-% IV SOLN
2.0000 g | Freq: Once | INTRAVENOUS | Status: AC
  Administered 2020-08-20: 2 g via INTRAVENOUS

## 2020-08-20 MED ORDER — LABETALOL HCL 5 MG/ML IV SOLN: 10.0000 mg | INTRAVENOUS | Status: DC | PRN

## 2020-08-20 MED ORDER — TELMISARTAN-HCTZ 80-12.5 MG PO TABS: 1.0000 | ORAL_TABLET | Freq: Every day | ORAL | Status: DC

## 2020-08-20 MED ORDER — TIOTROPIUM BROMIDE MONOHYDRATE 18 MCG IN CAPS
18.0000 ug | ORAL_CAPSULE | Freq: Every day | RESPIRATORY_TRACT | Status: DC
  Administered 2020-08-21: 18 ug via RESPIRATORY_TRACT
  Filled 2020-08-20: qty 5

## 2020-08-20 MED ORDER — ACETAMINOPHEN 325 MG PO TABS: 325.0000 mg | ORAL_TABLET | ORAL | Status: DC | PRN

## 2020-08-20 MED ORDER — SODIUM CHLORIDE 0.9 % IV SOLN: 500.0000 mL | Freq: Once | INTRAVENOUS | Status: DC | PRN

## 2020-08-20 MED ORDER — MIDAZOLAM HCL 2 MG/ML PO SYRP: 8.0000 mg | ORAL_SOLUTION | Freq: Once | ORAL | Status: DC | PRN

## 2020-08-20 MED ORDER — MIDAZOLAM HCL 2 MG/2ML IJ SOLN
INTRAMUSCULAR | Status: DC | PRN
  Administered 2020-08-20: 1 mg via INTRAVENOUS
  Administered 2020-08-20: 2 mg via INTRAVENOUS

## 2020-08-20 MED ORDER — ASPIRIN EC 81 MG PO TBEC
81.0000 mg | DELAYED_RELEASE_TABLET | Freq: Every day | ORAL | Status: DC
  Filled 2020-08-20: qty 1

## 2020-08-20 MED ORDER — FLUCONAZOLE 100 MG PO TABS
200.0000 mg | ORAL_TABLET | ORAL | Status: DC
  Filled 2020-08-20: qty 2

## 2020-08-20 MED ORDER — HEPARIN SODIUM (PORCINE) 1000 UNIT/ML IJ SOLN
INTRAMUSCULAR | Status: DC | PRN
  Administered 2020-08-20: 7000 [IU] via INTRAVENOUS
  Administered 2020-08-20: 3000 [IU] via INTRAVENOUS

## 2020-08-20 MED ORDER — ACETAMINOPHEN ER 650 MG PO TBCR: 650.0000 mg | EXTENDED_RELEASE_TABLET | Freq: Three times a day (TID) | ORAL | Status: DC | PRN

## 2020-08-20 SURGICAL SUPPLY — 17 items
BALLN VIATRAC 5X20X135 (BALLOONS) ×3
BALLOON VIATRAC 5X20X135 (BALLOONS) ×1 IMPLANT
CATH ANGIO 5F 100CM .035 PIG (CATHETERS) ×3 IMPLANT
CATH BEACON 5 .035 100 H1 TIP (CATHETERS) ×3 IMPLANT
DEVICE EMBOSHIELD NAV6 4.0-7.0 (FILTER) ×3 IMPLANT
DEVICE PRESTO INFLATION (MISCELLANEOUS) ×3 IMPLANT
DEVICE STARCLOSE SE CLOSURE (Vascular Products) ×3 IMPLANT
DEVICE TORQUE .025-.038 (MISCELLANEOUS) ×3 IMPLANT
GLIDEWIRE ANGLED SS 035X260CM (WIRE) ×3 IMPLANT
KIT CAROTID MANIFOLD (MISCELLANEOUS) ×3 IMPLANT
PACK ANGIOGRAPHY (CUSTOM PROCEDURE TRAY) ×3 IMPLANT
SHEATH BRITE TIP 5FRX11 (SHEATH) ×3 IMPLANT
SHEATH SHUTTLE 6FR (SHEATH) ×3 IMPLANT
STENT XACT CAR 9-7X40X136 (Permanent Stent) ×3 IMPLANT
SYR MEDRAD MARK 7 150ML (SYRINGE) ×3 IMPLANT
WIRE G VAS 035X260 STIFF (WIRE) ×3 IMPLANT
WIRE J 3MM .035X145CM (WIRE) ×3 IMPLANT

## 2020-08-20 NOTE — Progress Notes (Signed)
Pt. Received post carotid. No neuro deficits on arrival . Pupils 2-3 mm bilat.; nonreactive secondary to atropine given during procedure. Bilat. Hand grips and foot presses strong bilat. Pt. Given sips of liquids. Speech clear on arrival. No acute distress on arrival.

## 2020-08-20 NOTE — Op Note (Signed)
OPERATIVE NOTE DATE: 08/20/2020  PROCEDURE: 1.  Ultrasound guidance for vascular access right femoral artery 2.  Placement of a 9 mm proximal, 7 mm distal, 4 cm long exact stent with the use of the NAV-6 embolic protection device in the left carotid artery  PRE-OPERATIVE DIAGNOSIS: 1.  High-grade, recurrent left carotid artery stenosis.  Differing findings from CT angiogram and duplex requiring catheter-based angiogram for definitive imaging 2.  Recent syncope/TIA symptoms  POST-OPERATIVE DIAGNOSIS:  Same as above  SURGEON: Leotis Pain, MD  ASSISTANT(S): None  ANESTHESIA: local/MCS  ESTIMATED BLOOD LOSS: 25 cc cc  CONTRAST: 40 cc  FLUORO TIME: 5.8 minutes  MODERATE CONSCIOUS SEDATION TIME:  Approximately 45 minutes using 3 mg of Versed and 50 mcg of Fentanyl  FINDING(S): 1.   85 to 90% left internal carotid artery stenosis just distal to the previous endarterectomy site.  There is also a calcific plaque at the distal common carotid artery nearing the origin of the external carotid artery that was quite irregular.  SPECIMEN(S):   none  INDICATIONS:   Patient is a 64 y.o. male who presents with recent syncope and TIA type symptoms and what appears to be recurrent left carotid artery stenosis after previous endarterectomy.  The duplex showed a clear high-grade lesion.  The official report from the CT scan was of a 60% lesion which I had estimated at closer to an 80% lesion by my review.  The patient has a recurrent lesion as well as severe heart disease and carotid artery stenting was felt to be preferred to endarterectomy for that reason.  Risks and benefits were discussed and informed consent was obtained.   DESCRIPTION: After obtaining full informed written consent, the patient was brought back to the vascular suite and placed supine upon the table.  The patient received IV antibiotics prior to induction. Moderate conscious sedation was administered during a face to face  encounter with the patient throughout the procedure with my supervision of the RN administering medicines and monitoring the patients vital signs and mental status throughout from the start of the procedure until the patient was taken to the recovery room.  After obtaining adequate anesthesia, the patient was prepped and draped in the standard fashion.   The right femoral artery was visualized with ultrasound and found to be widely patent. It was then accessed under direct ultrasound guidance without difficulty with a Seldinger needle. A permanent image was recorded. A J-wire was placed and we then placed a 6 French sheath. The patient was then heparinized and a total of 10,000 units of intravenous heparin were given and an ACT was checked to confirm successful anticoagulation. A pigtail catheter was then placed into the ascending aorta. This showed reasonably normal origins of the great vessels with a type II arch. I then selectively cannulated the left common carotid artery without difficulty with a headhunter catheter and advanced into the mid left common carotid artery.  Cervical and cerebral carotid angiography was then performed. There were no obvious intracranial filling defects with fairly good cross-filling left to right. The carotid bifurcation demonstrated 85 to 90% left internal carotid artery stenosis just distal to the previous endarterectomy site.  There is also a calcific plaque at the distal common carotid artery nearing the origin of the external carotid artery that was quite irregular.  I then advanced into the external carotid artery with a Glidewire and the headhunter catheter and then exchanged for the Amplatz Super Stiff wire. Over the Amplatz Super  Stiff wire, a 6 [place] shuttle sheath was placed into the mid common carotid artery. I then used the NAV-6  Embolic protection device and crossed the lesion and parked this in the distal internal carotid artery at the base of the skull.  I then  selected a 9 mm proximal, 7 mm distal, 4 cm long exact stent. This was deployed across the lesion encompassing it in its entirety. A 5 mm diameter by 2 cm length balloon was used to post dilate the stent. Only about a 10-15% residual stenosis was present after angioplasty. Completion angiogram showed normal intracranial filling without new defects. At this point I elected to terminate the procedure. The sheath was removed and StarClose closure device was deployed in the right femoral artery with excellent hemostatic result. The patient was taken to the recovery room in stable condition having tolerated the procedure well.  COMPLICATIONS: none  CONDITION: stable  Leotis Pain 08/20/2020 9:09 AM   This note was created with Dragon Medical transcription system. Any errors in dictation are purely unintentional.

## 2020-08-20 NOTE — Progress Notes (Signed)
Dr. Lucky Cowboy at bedside, speaking with pt. And wife re: procedural results. Both verbalize understanding of conversation.

## 2020-08-20 NOTE — Consult Note (Addendum)
Pharmacy Antibiotic Note  Charles Blake is a 64 y.o. male admitted on 08/20/2020 with surgical prophylaxis.  Pharmacy has been consulted for Vancomycin dosing (for 2 doses as per consult).  Plan: Vancomycin 15 mg/kg  Q12H x2 doses for surgical prophylaxis.   Height: 5\' 7"  (170.2 cm) Weight: 105.2 kg (232 lb) IBW/kg (Calculated) : 66.1  Temp (24hrs), Avg:97.9 F (36.6 C), Min:97.9 F (36.6 C), Max:97.9 F (36.6 C)  Recent Labs  Lab 08/20/20 0737  CREATININE 1.10    Estimated Creatinine Clearance: 77.4 mL/min (by C-G formula based on SCr of 1.1 mg/dL).    Allergies  Allergen Reactions  . Rosuvastatin     Other reaction(s): Other (See Comments) Extreme joint pain & fatigue       Thank you for allowing pharmacy to be a part of this patient's care.  Rowland Lathe 08/20/2020 4:18 PM

## 2020-08-20 NOTE — Interval H&P Note (Signed)
History and Physical Interval Note:  08/20/2020 7:59 AM  Charles Blake  has presented today for surgery, with the diagnosis of LT Carotid Stent   ABBOTT    Carotid artery stenosis Covid  Sept 9.  The various methods of treatment have been discussed with the patient and family. After consideration of risks, benefits and other options for treatment, the patient has consented to  Procedure(s): CAROTID PTA/STENT INTERVENTION (Left) as a surgical intervention.  The patient's history has been reviewed, patient examined, no change in status, stable for surgery.  I have reviewed the patient's chart and labs.  Questions were answered to the patient's satisfaction.     Leotis Pain

## 2020-08-21 DIAGNOSIS — I6523 Occlusion and stenosis of bilateral carotid arteries: Principal | ICD-10-CM

## 2020-08-21 LAB — CBC
HCT: 36.3 % — ABNORMAL LOW (ref 39.0–52.0)
Hemoglobin: 13.3 g/dL (ref 13.0–17.0)
MCH: 33.6 pg (ref 26.0–34.0)
MCHC: 36.6 g/dL — ABNORMAL HIGH (ref 30.0–36.0)
MCV: 91.7 fL (ref 80.0–100.0)
Platelets: 190 10*3/uL (ref 150–400)
RBC: 3.96 MIL/uL — ABNORMAL LOW (ref 4.22–5.81)
RDW: 13.3 % (ref 11.5–15.5)
WBC: 10.8 10*3/uL — ABNORMAL HIGH (ref 4.0–10.5)
nRBC: 0 % (ref 0.0–0.2)

## 2020-08-21 LAB — BASIC METABOLIC PANEL
Anion gap: 9 (ref 5–15)
BUN: 13 mg/dL (ref 8–23)
CO2: 25 mmol/L (ref 22–32)
Calcium: 8.6 mg/dL — ABNORMAL LOW (ref 8.9–10.3)
Chloride: 106 mmol/L (ref 98–111)
Creatinine, Ser: 1.01 mg/dL (ref 0.61–1.24)
GFR calc Af Amer: >60 mL/min (ref >60)
GFR calc non Af Amer: >60 mL/min (ref >60)
Glucose, Bld: 101 mg/dL — ABNORMAL HIGH (ref 70–99)
Potassium: 3.9 mmol/L (ref 3.5–5.1)
Sodium: 140 mmol/L (ref 135–145)

## 2020-08-21 LAB — MAGNESIUM: Magnesium: 2.2 mg/dL (ref 1.7–2.4)

## 2020-08-21 MED ORDER — CLOPIDOGREL BISULFATE 75 MG PO TABS
75.0000 mg | ORAL_TABLET | Freq: Every day | ORAL | 3 refills | Status: DC
Start: 2020-08-22 — End: 2021-08-09

## 2020-08-21 NOTE — Discharge Instructions (Signed)
You may shower as of tomorrow.  Keep your groins clean and dry. No lifting greater than 10 pounds for at least a month.  Do not engage in any strenuous activity.

## 2020-08-21 NOTE — Discharge Summary (Signed)
[facility]    Discharge Summary  Patient ID:  Charles Blake MRN: [id number] DOB/AGE: Jun 23, 1956 64 y.o.  Admit date: 08/20/2020 Discharge date: 08/21/2020 Date of Surgery: 08/20/2020 Surgeon: Surgeon(s): Algernon Huxley, MD  Admission Diagnosis: Carotid stenosis, left [I65.22]  Discharge Diagnoses:  Carotid stenosis, left [I65.22]  Secondary Diagnoses: Past Medical History:  Diagnosis Date  . Anemia   . Anxiety   . Arthritis    fingers and hands  . Cancer ([place])    skin cancer  . COPD (chronic obstructive pulmonary disease) ([facility])    wheezing  . Coronary artery disease   . Dyspnea   . GERD (gastroesophageal reflux disease)   . Headache    2-3 times/ week  . History of hiatal hernia   . HOH (hard of hearing)   . Hypercholesteremia   . Hypertension    controlled  . Pneumonia   . Sleep apnea    CPAP  . Wears dentures    upper   Procedure(s): 08/20/20: 1.  Ultrasound guidance for vascular access right femoral artery 2.  Placement of a 9 mm proximal, 7 mm distal, 4 cm long exact stent with the use of the NAV-6 embolic protection device in the left carotid artery  Discharged Condition: Good  HPI / Hospital Course:  Patient is a 64 y.o. male who presents with recent syncope and TIA type symptoms and what appears to be recurrent left carotid artery stenosis after previous endarterectomy.  The duplex showed a clear high-grade lesion.  The official report from the CT scan was of a 60% lesion which I had estimated at closer to an 80% lesion by my review.  The patient has a recurrent lesion as well as severe heart disease and carotid artery stenting was felt to be preferred to endarterectomy for that reason.  Risks and benefits were discussed and informed consent was obtained. On 08/20/20 the patient underwent:  Ultrasound guidance for vascular access right femoral artery Placement of a 9 mm proximal, 7 mm distal, 4 cm long exact stent with the use of  the NAV-6 embolic protection device in the left carotid artery  The patient tolerated procedure fine with transferred from the recovery room to the ICU for observation overnight.  Patient's night of procedure was unremarkable.  During the patient's brief stay, his diet was advanced, he was urinating independently, his pain was controlled with use of p.o. pain medication and he was ambulating at baseline.  Day of discharge, the patient was afebrile with stable vital signs and unremarkable physical exam.  Physical exam:  Alert and oriented x3, no acute distress Face: Symmetrical, tongue midline Neck: Trachea midline, no swelling or bruising noted Cardiovascular: Regular rate and rhythm Pulmonary: Clear to auscultation bilaterally Abdomen: Soft, non-tender, non-distended Groin:  Access site: Clean dry and intact, no swelling or drainage noted Extremity: Warm distally to toes Neuro: 5/5 upper lower, no deficits noted  Labs: As below  Complications: None  Consults: None  Significant Diagnostic Studies: CBC Lab Results  Component Value Date   WBC 10.8 (H) 08/21/2020   HGB 13.3 08/21/2020   HCT 36.3 (L) 08/21/2020   MCV 91.7 08/21/2020   PLT 190 08/21/2020   BMET    Component Value Date/Time   NA 140 08/21/2020 0427   NA 139 03/02/2014 0430   K 3.9 08/21/2020 0427   K 3.9 03/02/2014 0430   CL 106 08/21/2020 0427   CL 106 03/02/2014 0430   CO2 25  08/21/2020 0427   CO2 29 03/02/2014 0430   GLUCOSE 101 (H) 08/21/2020 0427   GLUCOSE 123 (H) 03/02/2014 0430   BUN 13 08/21/2020 0427   BUN 13 03/02/2014 0430   CREATININE 1.01 08/21/2020 0427   CREATININE 0.96 03/02/2014 0430   CALCIUM 8.6 (L) 08/21/2020 0427   CALCIUM 7.6 (L) 03/02/2014 0430   GFRNONAA >60 08/21/2020 0427   GFRNONAA >60 03/02/2014 0430   GFRAA >60 08/21/2020 0427   GFRAA >60 03/02/2014 0430   COAG Lab Results  Component Value Date   INR 1.1 03/02/2014   Disposition:  Discharge to :Home  Allergies as  of 08/21/2020      Reactions   Rosuvastatin    Other reaction(s): Other (See Comments) Extreme joint pain & fatigue       Medication List    TAKE these medications   acetaminophen 650 MG CR tablet Commonly known as: TYLENOL Take 650 mg by mouth every 8 (eight) hours as needed for pain.   albuterol 108 (90 Base) MCG/ACT inhaler Commonly known as: VENTOLIN HFA Inhale 2 puffs into the lungs every 4 (four) hours as needed for wheezing.   amLODipine 10 MG tablet Commonly known as: NORVASC Take 10 mg by mouth daily. am   aspirin 81 MG tablet Take 81 mg by mouth daily. am   atorvastatin 40 MG tablet Commonly known as: LIPITOR Take 40 mg by mouth. am   carvedilol 3.125 MG tablet Commonly known as: COREG Take 3.125 mg by mouth 2 (two) times daily with a meal. Am and pm   clonazePAM 0.5 MG tablet Commonly known as: KLONOPIN Take 0.5 mg by mouth as needed.   clopidogrel 75 MG tablet Commonly known as: PLAVIX Take 1 tablet (75 mg total) by mouth daily. Start taking on: August 22, 2020   cyclobenzaprine 10 MG tablet Commonly known as: FLEXERIL Take 1 tablet (10 mg total) by mouth 3 (three) times daily as needed.   Doxycycline Hyclate 200 MG Tbec   escitalopram 20 MG tablet Commonly known as: LEXAPRO Take 20 mg by mouth daily. Am   Fish Oil 1000 MG Caps Take by mouth daily. am   fluconazole 200 MG tablet Commonly known as: DIFLUCAN Take 200 mg by mouth once a week.   fluticasone 50 MCG/ACT nasal spray Commonly known as: FLONASE Place into the nose.   furosemide 20 MG tablet Commonly known as: LASIX Take by mouth.   hydrochlorothiazide 25 MG tablet Commonly known as: HYDRODIURIL   HYDROcodone-acetaminophen 5-325 MG tablet Commonly known as: NORCO/VICODIN Take 1 tablet by mouth every 4 (four) hours as needed.   isosorbide mononitrate 60 MG 24 hr tablet Commonly known as: IMDUR Take by mouth.   ketoconazole 2 % shampoo Commonly known as: NIZORAL APPLY  TOPICALLY DIRECTLY TO SCALP EVERY OTHER DAY ALLOW TO SIT FOR 30 60 MINUTES BEFORE RINSING   ketorolac 10 MG tablet Commonly known as: TORADOL Take 1 tablet (10 mg total) by mouth every 6 (six) hours as needed.   losartan-hydrochlorothiazide 100-25 MG tablet Commonly known as: HYZAAR Take 1 tablet by mouth daily. am   lovastatin 20 MG tablet Commonly known as: MEVACOR Take 20 mg by mouth at bedtime.   Melatonin 3 MG Caps Take by mouth. pm   Multi-Vitamins Tabs Take by mouth.   nitroGLYCERIN 0.4 MG SL tablet Commonly known as: NITROSTAT SMARTSIG:1 Tablet(s) Sublingual PRN   pantoprazole 40 MG tablet Commonly known as: PROTONIX Take 40 mg by mouth daily. am  pravastatin 80 MG tablet Commonly known as: PRAVACHOL Take 80 mg by mouth daily.   tadalafil 20 MG tablet Commonly known as: CIALIS Take 20 mg by mouth daily as needed for erectile dysfunction.   telmisartan-hydrochlorothiazide 80-12.5 MG tablet Commonly known as: MICARDIS HCT Take 1 tablet by mouth daily.   tiotropium 18 MCG inhalation capsule Commonly known as: SPIRIVA Place 18 mcg into inhaler and inhale daily. am   traMADol 50 MG tablet Commonly known as: Ultram Take 1 tablet (50 mg total) by mouth every 6 (six) hours as needed for moderate pain.      Verbal and written Discharge instructions given to the patient. Wound care per Discharge AVS  Follow-up Information    Dew, Erskine Squibb, MD In 1 month.   Specialties: Vascular Surgery, Radiology, Interventional Cardiology Why: Can see Dew or Arna Medici. Will need carotid duplex with visit. Appointment scheduled for Tues. Oct 19 at 830 am Contact information: [id number] [phone]              Signed: Sela Hua, PA-C  08/21/2020, 10:48 AM

## 2020-08-24 ENCOUNTER — Telehealth (INDEPENDENT_AMBULATORY_CARE_PROVIDER_SITE_OTHER): Payer: Self-pay

## 2020-08-24 NOTE — Telephone Encounter (Signed)
I wouldn't worry about that currently.  Whenever you have surgery your WBC count will elevate because your body is trying to heal from the trauma.  So a slight increase is normal.  As far as MRSA, it's found in the nasal cavity of many people.  It's generally not harmful to you because those are your germs.  The reason we test for them is that we have to be careful about giving them to patients that may not have them.  When patients are in the hospital, giving them germs that they don't have when they are trying to fight other infections can make things much worse for them.  As long as you are not having fever, chills or signs of infection, these test are not of extreme concern.

## 2020-08-24 NOTE — Telephone Encounter (Signed)
The pt called and left a message on the nurses line about labs from his procedure . On the 13 th the pt had a peripheral vascular catheterization  Performed by Dr. Lucky Cowboy. The pt wants to know about his white blood cell count being high and having MRSA. Please advise.

## 2020-08-27 NOTE — Telephone Encounter (Signed)
I called the pt an left a VM making him aware of the NP instructions.

## 2020-09-24 ENCOUNTER — Other Ambulatory Visit (INDEPENDENT_AMBULATORY_CARE_PROVIDER_SITE_OTHER): Payer: Self-pay | Admitting: Vascular Surgery

## 2020-09-24 DIAGNOSIS — Z95828 Presence of other vascular implants and grafts: Secondary | ICD-10-CM

## 2020-09-25 ENCOUNTER — Encounter (INDEPENDENT_AMBULATORY_CARE_PROVIDER_SITE_OTHER): Payer: Medicare Other

## 2020-09-25 ENCOUNTER — Ambulatory Visit (INDEPENDENT_AMBULATORY_CARE_PROVIDER_SITE_OTHER): Payer: Medicare Other | Admitting: Nurse Practitioner

## 2020-10-01 ENCOUNTER — Ambulatory Visit (INDEPENDENT_AMBULATORY_CARE_PROVIDER_SITE_OTHER): Payer: Medicare Other

## 2020-10-01 ENCOUNTER — Ambulatory Visit (INDEPENDENT_AMBULATORY_CARE_PROVIDER_SITE_OTHER): Payer: Medicare Other | Admitting: Nurse Practitioner

## 2020-10-01 ENCOUNTER — Other Ambulatory Visit: Payer: Self-pay

## 2020-10-01 ENCOUNTER — Encounter (INDEPENDENT_AMBULATORY_CARE_PROVIDER_SITE_OTHER): Payer: Self-pay | Admitting: Nurse Practitioner

## 2020-10-01 VITALS — BP 153/66 | HR 73 | Resp 16 | Wt 231.6 lb

## 2020-10-01 DIAGNOSIS — Z9889 Other specified postprocedural states: Secondary | ICD-10-CM | POA: Diagnosis not present

## 2020-10-01 DIAGNOSIS — I10 Essential (primary) hypertension: Secondary | ICD-10-CM

## 2020-10-01 DIAGNOSIS — E785 Hyperlipidemia, unspecified: Secondary | ICD-10-CM

## 2020-10-01 DIAGNOSIS — I6522 Occlusion and stenosis of left carotid artery: Secondary | ICD-10-CM

## 2020-10-01 DIAGNOSIS — Z95828 Presence of other vascular implants and grafts: Secondary | ICD-10-CM

## 2020-10-07 ENCOUNTER — Encounter (INDEPENDENT_AMBULATORY_CARE_PROVIDER_SITE_OTHER): Payer: Self-pay | Admitting: Nurse Practitioner

## 2020-10-07 NOTE — Progress Notes (Signed)
Subjective:    Patient ID: Charles Blake, male    DOB: 05-01-1956, 64 y.o.   MRN: [id number] Chief Complaint  Patient presents with  . Follow-up    [facility] 45month follow up    The patient is seen for follow up evaluation of carotid stenosis status post left ICA stent on 08/20/2020.  This was due to recurrent stenosis of the left internal carotid artery.  There were no post operative problems or complications related to the surgery.  The patient denies neck or incisional pain.  The patient previously had a left carotid endarterectomy in 2015.  The patient denies interval amaurosis fugax. There is no recent history of TIA symptoms or focal motor deficits. There is no prior documented CVA.  The patient denies headache.  The patient is taking enteric-coated aspirin 81 mg daily.  The patient has a history of coronary artery disease, no recent episodes of angina or shortness of breath. The patient denies PAD or claudication symptoms. There is a history of hyperlipidemia which is being treated with a statin.   Today noninvasive studies show 40 to 59% stenosis of the right ICA with 1 to 39% stenosis of the left ICA.   Review of Systems  Eyes: Negative for visual disturbance.  Neurological: Negative for headaches.  All other systems reviewed and are negative.      Objective:   Physical Exam Vitals reviewed.  HENT:     Head: Normocephalic.  Neck:     Vascular: Carotid bruit present.  Cardiovascular:     Rate and Rhythm: Normal rate and regular rhythm.     Pulses: Normal pulses.     Heart sounds: Normal heart sounds.  Pulmonary:     Effort: Pulmonary effort is normal.  Neurological:     Mental Status: He is alert and oriented to person, place, and time.  Psychiatric:        Mood and Affect: Mood normal.        Behavior: Behavior normal.        Thought Content: Thought content normal.        Judgment: Judgment normal.     BP (!) 153/66 (BP Location: Right Arm)   Pulse 73    Resp 16   Wt 231 lb 9.6 oz (105.1 kg)   BMI 36.27 kg/m   Past Medical History:  Diagnosis Date  . Anemia   . Anxiety   . Arthritis    fingers and hands  . Cancer ([place])    skin cancer  . COPD (chronic obstructive pulmonary disease) (HCC)    wheezing  . Coronary artery disease   . Dyspnea   . GERD (gastroesophageal reflux disease)   . Headache    2-3 times/ week  . History of hiatal hernia   . HOH (hard of hearing)   . Hypercholesteremia   . Hypertension    controlled  . Pneumonia   . Sleep apnea    CPAP  . Wears dentures    upper    Social History   Socioeconomic History  . Marital status: Married    Spouse name: Bretta Bang   . Number of children: 1  . Years of education: Not on file  . Highest education level: Not on file  Occupational History  . Occupation: retired   Tobacco Use  . Smoking status: Current Every Day Smoker    Packs/day: 0.50    Years: 40.00    Pack years: 20.00    Types: Cigarettes  .  Smokeless tobacco: Former Network engineer and Sexual Activity  . Alcohol use: Yes    Comment: rarely  . Drug use: Never  . Sexual activity: Not on file  Other Topics Concern  . Not on file  Social History Narrative   Lives at home with wife.    Social Determinants of Health   Financial Resource Strain:   . Difficulty of Paying Living Expenses: Not on file  Food Insecurity:   . Worried About Charity fundraiser in the Last Year: Not on file  . Ran Out of Food in the Last Year: Not on file  Transportation Needs:   . Lack of Transportation (Medical): Not on file  . Lack of Transportation (Non-Medical): Not on file  Physical Activity:   . Days of Exercise per Week: Not on file  . Minutes of Exercise per Session: Not on file  Stress:   . Feeling of Stress : Not on file  Social Connections:   . Frequency of Communication with Friends and Family: Not on file  . Frequency of Social Gatherings with Friends and Family: Not on file  . Attends  Religious Services: Not on file  . Active Member of Clubs or Organizations: Not on file  . Attends Archivist Meetings: Not on file  . Marital Status: Not on file  Intimate Partner Violence:   . Fear of Current or Ex-Partner: Not on file  . Emotionally Abused: Not on file  . Physically Abused: Not on file  . Sexually Abused: Not on file    Past Surgical History:  Procedure Laterality Date  . APPENDECTOMY    . CARDIAC CATHETERIZATION    . CAROTID ENDARTERECTOMY Left   . CAROTID PTA/STENT INTERVENTION Left 08/20/2020   Procedure: CAROTID PTA/STENT INTERVENTION;  Surgeon: Algernon Huxley, MD;  Location: [place] [facility];  Service: Cardiovascular;  Laterality: Left;  . CATARACT EXTRACTION Left   . CATARACT EXTRACTION W/PHACO Right 05/24/2018   Procedure: CATARACT EXTRACTION PHACO AND INTRAOCULAR LENS PLACEMENT ([place]) RIGHT;  Surgeon: Eulogio Bear, MD;  Location: [place];  Service: Ophthalmology;  Laterality: Right;  CPAP, sleep apnea  . COLONOSCOPY N/A 05/29/2015   Procedure: COLONOSCOPY;  Surgeon: Lollie Sails, MD;  Location: [facility];  Service: Endoscopy;  Laterality: N/A;  . iliac stents    . TONSILLECTOMY      Family History  Problem Relation Age of Onset  . Heart attack Mother   . Hypertension Mother   . Kidney disease Mother   . Heart attack Father   . Hypertension Father   . Colon cancer Father   . Heart attack Brother     Allergies  Allergen Reactions  . Rosuvastatin     Other reaction(s): Other (See Comments) Extreme joint pain & fatigue     CBC Latest Ref Rng & Units 08/21/2020 08/03/2020 03/30/2017  WBC 4.0 - 10.5 K/uL 10.8(H) 11.1(H) 20.2(H)  Hemoglobin 13.0 - 17.0 g/dL 13.3 13.9 16.3  Hematocrit 39 - 52 % 36.3(L) 39.9 46.6  Platelets 150 - 400 K/uL 190 226 312      CMP     Component Value Date/Time   NA 140 08/21/2020 0427   NA 139 03/02/2014 0430   K 3.9 08/21/2020 0427   K 3.9 03/02/2014 0430   CL 106 08/21/2020  0427   CL 106 03/02/2014 0430   CO2 25 08/21/2020 0427   CO2 29 03/02/2014 0430   GLUCOSE 101 (H) 08/21/2020 0427  GLUCOSE 123 (H) 03/02/2014 0430   BUN 13 08/21/2020 0427   BUN 13 03/02/2014 0430   CREATININE 1.01 08/21/2020 0427   CREATININE 0.96 03/02/2014 0430   CALCIUM 8.6 (L) 08/21/2020 0427   CALCIUM 7.6 (L) 03/02/2014 0430   PROT 7.9 03/30/2017 0914   PROT 7.6 04/25/2012 1306   ALBUMIN 4.0 03/30/2017 0914   ALBUMIN 3.9 04/25/2012 1306   AST 26 03/30/2017 0914   AST 22 04/25/2012 1306   ALT 28 03/30/2017 0914   ALT 32 04/25/2012 1306   ALKPHOS 62 03/30/2017 0914   ALKPHOS 69 04/25/2012 1306   BILITOT 1.2 03/30/2017 0914   BILITOT 1.1 (H) 04/25/2012 1306   GFRNONAA >60 08/21/2020 0427   GFRNONAA >60 03/02/2014 0430   GFRAA >60 08/21/2020 0427   GFRAA >60 03/02/2014 0430          Assessment & Plan:   1. Carotid stenosis, left Recommend:  The patient is s/p successful left ICA stent  Duplex ultrasound preoperatively shows 40 to 59% contralateral stenosis.  Continue antiplatelet therapy as prescribed Continue management of CAD, HTN and Hyperlipidemia Healthy heart diet,  encouraged exercise at least 4 times per week  Follow up in 3 months with duplex ultrasound and physical exam based on the patient's carotid surgery   2. Benign essential hypertension Continue antihypertensive medications as already ordered, these medications have been reviewed and there are no changes at this time.   3. Hyperlipidemia, unspecified hyperlipidemia type Continue statin as ordered and reviewed, no changes at this time    Current Outpatient Medications on File Prior to Visit  Medication Sig Dispense Refill  . acetaminophen (TYLENOL) 650 MG CR tablet Take 650 mg by mouth every 8 (eight) hours as needed for pain.    Marland Kitchen albuterol (PROVENTIL HFA;VENTOLIN HFA) 108 (90 Base) MCG/ACT inhaler Inhale 2 puffs into the lungs every 4 (four) hours as needed for wheezing. 1 Inhaler 0  .  amLODipine (NORVASC) 10 MG tablet Take 10 mg by mouth daily. am    . aspirin 81 MG tablet Take 81 mg by mouth daily. am    . atorvastatin (LIPITOR) 40 MG tablet Take 40 mg by mouth. am    . carvedilol (COREG) 3.125 MG tablet Take 3.125 mg by mouth 2 (two) times daily with a meal. Am and pm    . clonazePAM (KLONOPIN) 0.5 MG tablet Take 0.5 mg by mouth as needed.     . clopidogrel (PLAVIX) 75 MG tablet Take 1 tablet (75 mg total) by mouth daily. 90 tablet 3  . escitalopram (LEXAPRO) 20 MG tablet Take 20 mg by mouth daily. Am    . fluconazole (DIFLUCAN) 200 MG tablet Take 200 mg by mouth once a week.     . fluticasone (FLONASE) 50 MCG/ACT nasal spray Place into the nose.    . furosemide (LASIX) 20 MG tablet Take by mouth.    . isosorbide mononitrate (IMDUR) 60 MG 24 hr tablet Take by mouth.    Marland Kitchen ketoconazole (NIZORAL) 2 % shampoo APPLY TOPICALLY DIRECTLY TO SCALP EVERY OTHER DAY ALLOW TO SIT FOR 30 60 MINUTES BEFORE RINSING    . lovastatin (MEVACOR) 20 MG tablet Take 20 mg by mouth at bedtime.    . Melatonin 3 MG CAPS Take by mouth. pm    . Multiple Vitamin (MULTI-VITAMINS) TABS Take by mouth.    . nitroGLYCERIN (NITROSTAT) 0.4 MG SL tablet SMARTSIG:1 Tablet(s) Sublingual PRN    . Omega-3 Fatty Acids (FISH OIL) 1000  MG CAPS Take by mouth daily. am    . pantoprazole (PROTONIX) 40 MG tablet Take 40 mg by mouth daily. am    . telmisartan-hydrochlorothiazide (MICARDIS HCT) 80-12.5 MG tablet Take 1 tablet by mouth daily.  3  . tiotropium (SPIRIVA) 18 MCG inhalation capsule Place 18 mcg into inhaler and inhale daily. am    . cyclobenzaprine (FLEXERIL) 10 MG tablet Take 1 tablet (10 mg total) by mouth 3 (three) times daily as needed. (Patient not taking: Reported on 07/08/2019) 15 tablet 0  . Doxycycline Hyclate 200 MG TBEC  (Patient not taking: Reported on 07/10/2020)  0  . hydrochlorothiazide (HYDRODIURIL) 25 MG tablet  (Patient not taking: Reported on 07/10/2020)  6  . HYDROcodone-acetaminophen  (NORCO/VICODIN) 5-325 MG tablet Take 1 tablet by mouth every 4 (four) hours as needed. (Patient not taking: Reported on 07/08/2019) 15 tablet 0  . ketorolac (TORADOL) 10 MG tablet Take 1 tablet (10 mg total) by mouth every 6 (six) hours as needed. (Patient not taking: Reported on 07/08/2019) 20 tablet 0  . losartan-hydrochlorothiazide (HYZAAR) 100-25 MG per tablet Take 1 tablet by mouth daily. am (Patient not taking: Reported on 07/10/2020)    . pravastatin (PRAVACHOL) 80 MG tablet Take 80 mg by mouth daily. (Patient not taking: Reported on 08/20/2020)    . tadalafil (CIALIS) 20 MG tablet Take 20 mg by mouth daily as needed for erectile dysfunction. (Patient not taking: Reported on 08/20/2020)    . traMADol (ULTRAM) 50 MG tablet Take 1 tablet (50 mg total) by mouth every 6 (six) hours as needed for moderate pain. (Patient not taking: Reported on 08/20/2020) 12 tablet 0   No current facility-administered medications on file prior to visit.    There are no Patient Instructions on file for this visit. No follow-ups on file.   Kris Hartmann, NP

## 2020-11-05 DIAGNOSIS — J449 Chronic obstructive pulmonary disease, unspecified: Secondary | ICD-10-CM | POA: Insufficient documentation

## 2020-11-05 DIAGNOSIS — F17200 Nicotine dependence, unspecified, uncomplicated: Secondary | ICD-10-CM | POA: Insufficient documentation

## 2020-11-29 ENCOUNTER — Other Ambulatory Visit: Payer: Self-pay | Admitting: Physician Assistant

## 2020-11-29 DIAGNOSIS — F17210 Nicotine dependence, cigarettes, uncomplicated: Secondary | ICD-10-CM

## 2020-12-11 ENCOUNTER — Other Ambulatory Visit: Payer: Self-pay

## 2020-12-11 ENCOUNTER — Ambulatory Visit
Admission: RE | Admit: 2020-12-11 | Discharge: 2020-12-11 | Disposition: A | Discharge status: Home / Self Care | Payer: Medicare Other | Source: Ambulatory Visit | Attending: Oncology | Admitting: Oncology

## 2020-12-11 DIAGNOSIS — F17210 Nicotine dependence, cigarettes, uncomplicated: Secondary | ICD-10-CM | POA: Diagnosis present

## 2020-12-31 ENCOUNTER — Other Ambulatory Visit (INDEPENDENT_AMBULATORY_CARE_PROVIDER_SITE_OTHER): Payer: Self-pay | Admitting: Nurse Practitioner

## 2020-12-31 DIAGNOSIS — I6523 Occlusion and stenosis of bilateral carotid arteries: Secondary | ICD-10-CM

## 2021-01-01 ENCOUNTER — Ambulatory Visit (INDEPENDENT_AMBULATORY_CARE_PROVIDER_SITE_OTHER): Payer: Medicare Other | Admitting: Vascular Surgery

## 2021-01-01 ENCOUNTER — Ambulatory Visit (INDEPENDENT_AMBULATORY_CARE_PROVIDER_SITE_OTHER): Payer: Medicare Other

## 2021-01-01 ENCOUNTER — Other Ambulatory Visit: Payer: Self-pay

## 2021-01-01 ENCOUNTER — Encounter (INDEPENDENT_AMBULATORY_CARE_PROVIDER_SITE_OTHER): Payer: Self-pay | Admitting: Vascular Surgery

## 2021-01-01 VITALS — BP 159/68 | HR 72 | Resp 16 | Wt 240.0 lb

## 2021-01-01 DIAGNOSIS — E785 Hyperlipidemia, unspecified: Secondary | ICD-10-CM | POA: Diagnosis not present

## 2021-01-01 DIAGNOSIS — I700 Atherosclerosis of aorta: Secondary | ICD-10-CM | POA: Diagnosis not present

## 2021-01-01 DIAGNOSIS — I10 Essential (primary) hypertension: Secondary | ICD-10-CM

## 2021-01-01 DIAGNOSIS — I6523 Occlusion and stenosis of bilateral carotid arteries: Secondary | ICD-10-CM | POA: Diagnosis not present

## 2021-01-01 DIAGNOSIS — I739 Peripheral vascular disease, unspecified: Secondary | ICD-10-CM

## 2021-01-01 NOTE — Assessment & Plan Note (Signed)
Carotid duplex today demonstrates a widely patent left carotid stent and a right carotid stenosis which is stable in the 40 to 59% range.  No role for intervention at this level.  Continue current medical regimen.  Recheck in 6 months.

## 2021-01-01 NOTE — Progress Notes (Signed)
MRN : [id number]  Charles Blake is a 65 y.o. (06/02/1956) male who presents with chief complaint of  Chief Complaint  Patient presents with  . Carotid    6 month ultrasound follow up  .  History of Present Illness: Patient returns in follow-up of his carotid disease.  He is about 4 to 5 months status post left carotid artery stent placement for high-grade recurrent stenosis.  He is doing well.  No further syncopal episodes, severe dizziness, or TIA/stroke symptoms. Carotid duplex today demonstrates a widely patent left carotid stent and a right carotid stenosis which is stable in the 40 to 59% range.   Current Outpatient Medications  Medication Sig Dispense Refill  . acetaminophen (TYLENOL) 650 MG CR tablet Take 650 mg by mouth every 8 (eight) hours as needed for pain.    Marland Kitchen albuterol (PROVENTIL HFA;VENTOLIN HFA) 108 (90 Base) MCG/ACT inhaler Inhale 2 puffs into the lungs every 4 (four) hours as needed for wheezing. 1 Inhaler 0  . amLODipine (NORVASC) 10 MG tablet Take 10 mg by mouth daily. am    . aspirin 81 MG tablet Take 81 mg by mouth daily. am    . atorvastatin (LIPITOR) 40 MG tablet Take 40 mg by mouth. am    . carvedilol (COREG) 3.125 MG tablet Take 3.125 mg by mouth 2 (two) times daily with a meal. Am and pm    . clonazePAM (KLONOPIN) 0.5 MG tablet Take 0.5 mg by mouth as needed.     . clopidogrel (PLAVIX) 75 MG tablet Take 1 tablet (75 mg total) by mouth daily. 90 tablet 3  . escitalopram (LEXAPRO) 20 MG tablet Take 20 mg by mouth daily. Am    . fluconazole (DIFLUCAN) 200 MG tablet Take 200 mg by mouth once a week.     . fluticasone (FLONASE) 50 MCG/ACT nasal spray Place into the nose.    . furosemide (LASIX) 20 MG tablet Take by mouth.    . isosorbide mononitrate (IMDUR) 60 MG 24 hr tablet Take by mouth.    Marland Kitchen ketoconazole (NIZORAL) 2 % shampoo APPLY TOPICALLY DIRECTLY TO SCALP EVERY OTHER DAY ALLOW TO SIT FOR 30 60 MINUTES BEFORE RINSING    . lovastatin (MEVACOR) 20 MG  tablet Take 20 mg by mouth at bedtime.    . Melatonin 3 MG CAPS Take by mouth. pm    . Multiple Vitamin (MULTI-VITAMINS) TABS Take by mouth.    . nitroGLYCERIN (NITROSTAT) 0.4 MG SL tablet SMARTSIG:1 Tablet(s) Sublingual PRN    . Omega-3 Fatty Acids (FISH OIL) 1000 MG CAPS Take by mouth daily. am    . pantoprazole (PROTONIX) 40 MG tablet Take 40 mg by mouth daily. am    . telmisartan-hydrochlorothiazide (MICARDIS HCT) 80-12.5 MG tablet Take 1 tablet by mouth daily.  3  . tiotropium (SPIRIVA) 18 MCG inhalation capsule Place 18 mcg into inhaler and inhale daily. am    . cyclobenzaprine (FLEXERIL) 10 MG tablet Take 1 tablet (10 mg total) by mouth 3 (three) times daily as needed. (Patient not taking: No sig reported) 15 tablet 0  . Doxycycline Hyclate 200 MG TBEC  (Patient not taking: No sig reported)  0  . hydrochlorothiazide (HYDRODIURIL) 25 MG tablet  (Patient not taking: No sig reported)  6  . HYDROcodone-acetaminophen (NORCO/VICODIN) 5-325 MG tablet Take 1 tablet by mouth every 4 (four) hours as needed. (Patient not taking: No sig reported) 15 tablet 0  . ketorolac (TORADOL) 10 MG tablet Take 1  tablet (10 mg total) by mouth every 6 (six) hours as needed. (Patient not taking: No sig reported) 20 tablet 0  . losartan-hydrochlorothiazide (HYZAAR) 100-25 MG per tablet Take 1 tablet by mouth daily. am (Patient not taking: No sig reported)    . pravastatin (PRAVACHOL) 80 MG tablet Take 80 mg by mouth daily. (Patient not taking: No sig reported)    . tadalafil (CIALIS) 20 MG tablet Take 20 mg by mouth daily as needed for erectile dysfunction. (Patient not taking: No sig reported)    . traMADol (ULTRAM) 50 MG tablet Take 1 tablet (50 mg total) by mouth every 6 (six) hours as needed for moderate pain. (Patient not taking: No sig reported) 12 tablet 0   No current facility-administered medications for this visit.    Past Medical History:  Diagnosis Date  . Anemia   . Anxiety   . Arthritis    fingers  and hands  . Cancer ([place])    skin cancer  . COPD (chronic obstructive pulmonary disease) (HCC)    wheezing  . Coronary artery disease   . Dyspnea   . GERD (gastroesophageal reflux disease)   . Headache    2-3 times/ week  . History of hiatal hernia   . HOH (hard of hearing)   . Hypercholesteremia   . Hypertension    controlled  . Pneumonia   . Sleep apnea    CPAP  . Wears dentures    upper    Past Surgical History:  Procedure Laterality Date  . APPENDECTOMY    . CARDIAC CATHETERIZATION    . CAROTID ENDARTERECTOMY Left   . CAROTID PTA/STENT INTERVENTION Left 08/20/2020   Procedure: CAROTID PTA/STENT INTERVENTION;  Surgeon: Algernon Huxley, MD;  Location: [place] [facility];  Service: Cardiovascular;  Laterality: Left;  . CATARACT EXTRACTION Left   . CATARACT EXTRACTION W/PHACO Right 05/24/2018   Procedure: CATARACT EXTRACTION PHACO AND INTRAOCULAR LENS PLACEMENT ([place]) RIGHT;  Surgeon: Eulogio Bear, MD;  Location: [place];  Service: Ophthalmology;  Laterality: Right;  CPAP, sleep apnea  . COLONOSCOPY N/A 05/29/2015   Procedure: COLONOSCOPY;  Surgeon: Lollie Sails, MD;  Location: [facility];  Service: Endoscopy;  Laterality: N/A;  . iliac stents    . TONSILLECTOMY       Social History   Tobacco Use  . Smoking status: Former Smoker    Packs/day: 0.50    Years: 40.00    Pack years: 20.00    Types: Cigarettes    Quit date: 11/09/2020    Years since quitting: 0.1  . Smokeless tobacco: Former Network engineer Use Topics  . Alcohol use: Yes    Comment: rarely  . Drug use: Never       Family History  Problem Relation Age of Onset  . Heart attack Mother   . Hypertension Mother   . Kidney disease Mother   . Heart attack Father   . Hypertension Father   . Colon cancer Father   . Heart attack Brother      Allergies  Allergen Reactions  . Rosuvastatin     Other reaction(s): Other (See Comments) Extreme joint pain & fatigue       REVIEW OF SYSTEMS (Negative unless checked)  Constitutional: [] Weight loss  [] Fever  [] Chills Cardiac: [] Chest pain   [] Chest pressure   [] Palpitations   [] Shortness of breath when laying flat   [] Shortness of breath at rest   [] Shortness of breath with exertion. Vascular:  []   Pain in legs with walking   [] Pain in legs at rest   [] Pain in legs when laying flat   [] Claudication   [] Pain in feet when walking  [] Pain in feet at rest  [] Pain in feet when laying flat   [] History of DVT   [] Phlebitis   [] Swelling in legs   [] Varicose veins   [] Non-healing ulcers Pulmonary:   [] Uses home oxygen   [] Productive cough   [] Hemoptysis   [] Wheeze  [] COPD   [] Asthma Neurologic:  [] Dizziness  [] Blackouts   [] Seizures   [] History of stroke   [] History of TIA  [] Aphasia   [] Temporary blindness   [] Dysphagia   [] Weakness or numbness in arms   [] Weakness or numbness in legs Musculoskeletal:  [x] Arthritis   [] Joint swelling   [] Joint pain   [] Low back pain Hematologic:  [] Easy bruising  [] Easy bleeding   [] Hypercoagulable state   [] Anemic  [] Hepatitis Gastrointestinal:  [] Blood in stool   [] Vomiting blood  [x] Gastroesophageal reflux/heartburn   [] Difficulty swallowing. Genitourinary:  [] Chronic kidney disease   [] Difficult urination  [] Frequent urination  [] Burning with urination   [] Blood in urine Skin:  [] Rashes   [] Ulcers   [] Wounds Psychological:  [] History of anxiety   []  History of major depression.  Physical Examination  Vitals:   01/01/21 0820  BP: (!) 159/68  Pulse: 72  Resp: 16  Weight: 240 lb (108.9 kg)   Body mass index is 37.59 kg/m. Gen:  WD/WN, NAD Head: [place]/AT, No temporalis wasting. Ear/Nose/Throat: Hearing grossly intact, nares w/o erythema or drainage, trachea midline Eyes: Conjunctiva clear. Sclera non-icteric Neck: Supple.  No bruit  Pulmonary:  Good air movement, equal and clear to auscultation bilaterally.  Cardiac: RRR, No JVD Vascular:  Vessel Right Left  Radial Palpable  Palpable           Musculoskeletal: M/S 5/5 throughout.  No deformity or atrophy.  No edema. Neurologic: CN 2-12 intact. Sensation grossly intact in extremities.  Symmetrical.  Speech is fluent. Motor exam as listed above. Psychiatric: Judgment intact, Mood & affect appropriate for pt's clinical situation. Dermatologic: No rashes or ulcers noted.  No cellulitis or open wounds.      CBC Lab Results  Component Value Date   WBC 10.8 (H) 08/21/2020   HGB 13.3 08/21/2020   HCT 36.3 (L) 08/21/2020   MCV 91.7 08/21/2020   PLT 190 08/21/2020    BMET    Component Value Date/Time   NA 140 08/21/2020 0427   NA 139 03/02/2014 0430   K 3.9 08/21/2020 0427   K 3.9 03/02/2014 0430   CL 106 08/21/2020 0427   CL 106 03/02/2014 0430   CO2 25 08/21/2020 0427   CO2 29 03/02/2014 0430   GLUCOSE 101 (H) 08/21/2020 0427   GLUCOSE 123 (H) 03/02/2014 0430   BUN 13 08/21/2020 0427   BUN 13 03/02/2014 0430   CREATININE 1.01 08/21/2020 0427   CREATININE 0.96 03/02/2014 0430   CALCIUM 8.6 (L) 08/21/2020 0427   CALCIUM 7.6 (L) 03/02/2014 0430   GFRNONAA >60 08/21/2020 0427   GFRNONAA >60 03/02/2014 0430   GFRAA >60 08/21/2020 0427   GFRAA >60 03/02/2014 0430   CrCl cannot be calculated (Patient's most recent lab result is older than the maximum 21 days allowed.).  COAG Lab Results  Component Value Date   INR 1.1 03/02/2014    Radiology CT CHEST LUNG CANCER SCREENING LOW DOSE WO CONTRAST  Result Date: 12/11/2020 CLINICAL DATA:  65 year old male former smoker,  quit 1 year ago, with 52 pack-year history of smoking, for initial lung cancer screening EXAM: CT CHEST WITHOUT CONTRAST LOW-DOSE FOR LUNG CANCER SCREENING TECHNIQUE: Multidetector CT imaging of the chest was performed following the standard protocol without IV contrast. COMPARISON:  None. FINDINGS: Cardiovascular: Heart is normal in size. Small pericardial effusion. No evidence of thoracic aortic aneurysm. Atherosclerotic  calcifications of the aortic arch. Three vessel coronary atherosclerosis. Mediastinum/Nodes: No suspicious mediastinal lymphadenopathy. Visualized thyroid is unremarkable. Lungs/Pleura: Two left lower lobe pulmonary nodules measuring up to 4.4 mm. Mild centrilobular emphysematous changes, upper lung predominant. No focal consolidation. No pleural effusion or pneumothorax. Upper Abdomen: Visualized upper abdomen is notable for hepatic steatosis and vascular calcifications. Musculoskeletal: Mild degenerative changes of the lumbar spine. IMPRESSION: Lung-RADS 2, benign appearance or behavior. Continue annual screening with low-dose chest CT without contrast in 12 months. Aortic Atherosclerosis (ICD10-I70.0) and Emphysema (ICD10-J43.9). Electronically Signed   By: Julian Hy M.D.   On: 12/11/2020 09:56     Assessment/Plan Hypertension blood pressure control important in reducing the progression of atherosclerotic disease. On appropriate oral medications.   Aortic atherosclerosis ([place]) Status post aortoiliac intervention several years ago and doing well.  Hyperlipidemia lipid control important in reducing the progression of atherosclerotic disease. Continue statin therapy  PAD (peripheral artery disease) ([place]) Many years status post intervention.  Recently checked and being followed.  No change in medical regimen.  No role for intervention at this time.  Carotid artery stenosis Carotid duplex today demonstrates a widely patent left carotid stent and a right carotid stenosis which is stable in the 40 to 59% range.  No role for intervention at this level.  Continue current medical regimen.  Recheck in 6 months.    Leotis Pain, MD  01/01/2021 8:47 AM    This note was created with Dragon medical transcription system.  Any errors from dictation are purely unintentional

## 2021-07-02 ENCOUNTER — Encounter (INDEPENDENT_AMBULATORY_CARE_PROVIDER_SITE_OTHER): Payer: Medicare Other

## 2021-07-02 ENCOUNTER — Ambulatory Visit (INDEPENDENT_AMBULATORY_CARE_PROVIDER_SITE_OTHER): Payer: Medicare Other | Admitting: Vascular Surgery

## 2021-07-16 ENCOUNTER — Ambulatory Visit (INDEPENDENT_AMBULATORY_CARE_PROVIDER_SITE_OTHER): Payer: Medicare Other

## 2021-07-16 ENCOUNTER — Other Ambulatory Visit: Payer: Self-pay

## 2021-07-16 ENCOUNTER — Ambulatory Visit (INDEPENDENT_AMBULATORY_CARE_PROVIDER_SITE_OTHER): Payer: Medicare Other | Admitting: Vascular Surgery

## 2021-07-16 ENCOUNTER — Encounter (INDEPENDENT_AMBULATORY_CARE_PROVIDER_SITE_OTHER): Payer: Self-pay | Admitting: Vascular Surgery

## 2021-07-16 VITALS — BP 146/66 | HR 71 | Resp 17 | Ht 67.0 in | Wt 233.0 lb

## 2021-07-16 DIAGNOSIS — I6523 Occlusion and stenosis of bilateral carotid arteries: Secondary | ICD-10-CM

## 2021-07-16 DIAGNOSIS — E785 Hyperlipidemia, unspecified: Secondary | ICD-10-CM

## 2021-07-16 DIAGNOSIS — I739 Peripheral vascular disease, unspecified: Secondary | ICD-10-CM

## 2021-07-16 DIAGNOSIS — I10 Essential (primary) hypertension: Secondary | ICD-10-CM

## 2021-07-16 DIAGNOSIS — I700 Atherosclerosis of aorta: Secondary | ICD-10-CM

## 2021-07-16 NOTE — Assessment & Plan Note (Signed)
Duplex today shows the left carotid stent to be widely patent.  Right carotid artery stenosis is stable in the 1 to 39% range.  Overall doing well.  We are about 1 year out from his carotid stent.  We can now follow this annually.  Continue current medical regimen which includes Plavix and a statin agent.

## 2021-07-16 NOTE — Progress Notes (Signed)
MRN : [id number]  Charles Blake is a 65 y.o. (Dec 23, 1955) male who presents with chief complaint of  Chief Complaint  Patient presents with   Follow-up  .  History of Present Illness: Patient returns today in follow up of multiple vascular issues.  He is doing well today.  He does have some mild hip and buttock claudication with activity that has to be fairly strenuous but this is pretty mild at this point.  No ulceration or infection.  ABIs today are stable at 1.06 on the right and 1.1 on the left with triphasic waveforms and normal digital pressures.  Duplex shows some mild elevated velocities in the common iliac artery stents that may demonstrate some mild to moderate restenosis. He is also followed for his carotid disease.  He is almost 1 year status post left carotid artery stenting for recurrent stenosis in the left carotid artery.  He is doing well.  He denies any focal neurologic symptoms.  He continues on Plavix and a statin agent. Duplex today shows the left carotid stent to be widely patent.  Right carotid artery stenosis is stable in the 1 to 39% range.  Current Outpatient Medications  Medication Sig Dispense Refill   acetaminophen (TYLENOL) 650 MG CR tablet Take 650 mg by mouth every 8 (eight) hours as needed for pain.     albuterol (PROVENTIL HFA;VENTOLIN HFA) 108 (90 Base) MCG/ACT inhaler Inhale 2 puffs into the lungs every 4 (four) hours as needed for wheezing. 1 Inhaler 0   amLODipine (NORVASC) 10 MG tablet Take 10 mg by mouth daily. am     carvedilol (COREG) 3.125 MG tablet Take 3.125 mg by mouth 2 (two) times daily with a meal. Am and pm     clonazePAM (KLONOPIN) 0.5 MG tablet Take 0.5 mg by mouth as needed.      clopidogrel (PLAVIX) 75 MG tablet Take 1 tablet (75 mg total) by mouth daily. 90 tablet 3   escitalopram (LEXAPRO) 20 MG tablet Take 20 mg by mouth daily. Am     fluticasone (FLONASE) 50 MCG/ACT nasal spray Place into the nose.     furosemide (LASIX) 20 MG  tablet Take by mouth.     ketoconazole (NIZORAL) 2 % shampoo APPLY TOPICALLY DIRECTLY TO SCALP EVERY OTHER DAY ALLOW TO SIT FOR 30 60 MINUTES BEFORE RINSING     losartan-hydrochlorothiazide (HYZAAR) 100-25 MG per tablet Take 1 tablet by mouth daily. am     lovastatin (MEVACOR) 20 MG tablet Take 20 mg by mouth at bedtime.     Melatonin 3 MG CAPS Take by mouth. pm     Multiple Vitamin (MULTI-VITAMINS) TABS Take by mouth.     nitroGLYCERIN (NITROSTAT) 0.4 MG SL tablet SMARTSIG:1 Tablet(s) Sublingual PRN     Omega-3 Fatty Acids (FISH OIL) 1000 MG CAPS Take by mouth daily. am     pantoprazole (PROTONIX) 40 MG tablet Take 40 mg by mouth daily. am     telmisartan-hydrochlorothiazide (MICARDIS HCT) 80-12.5 MG tablet Take 1 tablet by mouth daily.  3   tiotropium (SPIRIVA) 18 MCG inhalation capsule Place 18 mcg into inhaler and inhale daily. am     aspirin 81 MG tablet Take 81 mg by mouth daily. am (Patient not taking: Reported on 07/16/2021)     atorvastatin (LIPITOR) 40 MG tablet Take 40 mg by mouth. am (Patient not taking: Reported on 07/16/2021)     cyclobenzaprine (FLEXERIL) 10 MG tablet Take 1 tablet (10 mg total) by  mouth 3 (three) times daily as needed. (Patient not taking: Reported on 07/16/2021) 15 tablet 0   Doxycycline Hyclate 200 MG TBEC  (Patient not taking: Reported on 07/16/2021)  0   fluconazole (DIFLUCAN) 200 MG tablet Take 200 mg by mouth once a week.  (Patient not taking: Reported on 07/16/2021)     hydrochlorothiazide (HYDRODIURIL) 25 MG tablet  (Patient not taking: Reported on 07/16/2021)  6   HYDROcodone-acetaminophen (NORCO/VICODIN) 5-325 MG tablet Take 1 tablet by mouth every 4 (four) hours as needed. (Patient not taking: Reported on 07/16/2021) 15 tablet 0   isosorbide mononitrate (IMDUR) 60 MG 24 hr tablet Take by mouth. (Patient not taking: Reported on 07/16/2021)     ketorolac (TORADOL) 10 MG tablet Take 1 tablet (10 mg total) by mouth every 6 (six) hours as needed. (Patient not taking:  Reported on 07/16/2021) 20 tablet 0   pravastatin (PRAVACHOL) 80 MG tablet Take 80 mg by mouth daily. (Patient not taking: Reported on 07/16/2021)     tadalafil (CIALIS) 20 MG tablet Take 20 mg by mouth daily as needed for erectile dysfunction. (Patient not taking: Reported on 07/16/2021)     traMADol (ULTRAM) 50 MG tablet Take 1 tablet (50 mg total) by mouth every 6 (six) hours as needed for moderate pain. (Patient not taking: Reported on 07/16/2021) 12 tablet 0   No current facility-administered medications for this visit.    Past Medical History:  Diagnosis Date   Anemia    Anxiety    Arthritis    fingers and hands   Cancer (HCC)    skin cancer   COPD (chronic obstructive pulmonary disease) (HCC)    wheezing   Coronary artery disease    Dyspnea    GERD (gastroesophageal reflux disease)    Headache    2-3 times/ week   History of hiatal hernia    HOH (hard of hearing)    Hypercholesteremia    Hypertension    controlled   Pneumonia    Sleep apnea    CPAP   Wears dentures    upper    Past Surgical History:  Procedure Laterality Date   APPENDECTOMY     CARDIAC CATHETERIZATION     CAROTID ENDARTERECTOMY Left    CAROTID PTA/STENT INTERVENTION Left 08/20/2020   Procedure: CAROTID PTA/STENT INTERVENTION;  Surgeon: Algernon Huxley, MD;  Location: [facility];  Service: Cardiovascular;  Laterality: Left;   CATARACT EXTRACTION Left    CATARACT EXTRACTION W/PHACO Right 05/24/2018   Procedure: CATARACT EXTRACTION PHACO AND INTRAOCULAR LENS PLACEMENT ([place]) RIGHT;  Surgeon: Eulogio Bear, MD;  Location: [facility];  Service: Ophthalmology;  Laterality: Right;  CPAP, sleep apnea   COLONOSCOPY N/A 05/29/2015   Procedure: COLONOSCOPY;  Surgeon: Lollie Sails, MD;  Location: [facility];  Service: Endoscopy;  Laterality: N/A;   iliac stents     TONSILLECTOMY       Social History   Tobacco Use   Smoking status: Former    Packs/day: 0.50    Years: 40.00    Pack  years: 20.00    Types: Cigarettes    Quit date: 11/09/2020    Years since quitting: 0.6   Smokeless tobacco: Former  Substance Use Topics   Alcohol use: Yes    Comment: rarely   Drug use: Never      Family History  Problem Relation Age of Onset   Heart attack Mother    Hypertension Mother    Kidney disease  Mother    Heart attack Father    Hypertension Father    Colon cancer Father    Heart attack Brother      Allergies  Allergen Reactions   Rosuvastatin     Other reaction(s): Other (See Comments) Extreme joint pain & fatigue      REVIEW OF SYSTEMS (Negative unless checked)  Constitutional: '[]'$ Weight loss  '[]'$ Fever  '[]'$ Chills Cardiac: '[]'$ Chest pain   '[]'$ Chest pressure   '[]'$ Palpitations   '[]'$ Shortness of breath when laying flat   '[]'$ Shortness of breath at rest   '[]'$ Shortness of breath with exertion. Vascular:  '[x]'$ Pain in legs with walking   '[]'$ Pain in legs at rest   '[]'$ Pain in legs when laying flat   '[]'$ Claudication   '[]'$ Pain in feet when walking  '[]'$ Pain in feet at rest  '[]'$ Pain in feet when laying flat   '[]'$ History of DVT   '[]'$ Phlebitis   '[]'$ Swelling in legs   '[]'$ Varicose veins   '[]'$ Non-healing ulcers Pulmonary:   '[]'$ Uses home oxygen   '[]'$ Productive cough   '[]'$ Hemoptysis   '[]'$ Wheeze  '[]'$ COPD   '[]'$ Asthma Neurologic:  '[]'$ Dizziness  '[]'$ Blackouts   '[]'$ Seizures   '[]'$ History of stroke   '[]'$ History of TIA  '[]'$ Aphasia   '[]'$ Temporary blindness   '[]'$ Dysphagia   '[]'$ Weakness or numbness in arms   '[]'$ Weakness or numbness in legs Musculoskeletal:  '[x]'$ Arthritis   '[]'$ Joint swelling   '[]'$ Joint pain   '[]'$ Low back pain Hematologic:  '[]'$ Easy bruising  '[]'$ Easy bleeding   '[]'$ Hypercoagulable state   '[]'$ Anemic   Gastrointestinal:  '[]'$ Blood in stool   '[]'$ Vomiting blood  '[x]'$ Gastroesophageal reflux/heartburn   '[]'$ Abdominal pain Genitourinary:  '[]'$ Chronic kidney disease   '[]'$ Difficult urination  '[]'$ Frequent urination  '[]'$ Burning with urination   '[]'$ Hematuria Skin:  '[]'$ Rashes   '[]'$ Ulcers   '[]'$ Wounds Psychological:  '[]'$ History of anxiety   '[]'$  History of  major depression.  Physical Examination  BP (!) 146/66 (BP Location: Right Arm)   Pulse 71   Resp 17   Ht '5\' 7"'$  (1.702 m)   Wt 233 lb (105.7 kg)   BMI 36.49 kg/m  Gen:  WD/WN, NAD Head: [place]/AT, No temporalis wasting. Ear/Nose/Throat: Hearing grossly intact, nares w/o erythema or drainage Eyes: Conjunctiva clear. Sclera non-icteric Neck: Supple.  Trachea midline. No carotid bruit Pulmonary:  Good air movement, no use of accessory muscles.  Cardiac: RRR, no JVD Vascular:  Vessel Right Left  Radial Palpable Palpable                          PT Palpable Palpable  DP Palpable Palpable   Gastrointestinal: soft, non-tender/non-distended. No guarding/reflex.  Musculoskeletal: M/S 5/5 throughout.  No deformity or atrophy. No significant LE edema. Neurologic: Sensation grossly intact in extremities.  Symmetrical.  Speech is fluent.  Psychiatric: Judgment intact, Mood & affect appropriate for pt's clinical situation. Dermatologic: No rashes or ulcers noted.  No cellulitis or open wounds.      Labs No results found for this or any previous visit (from the past 2160 hour(s)).  Radiology VAS US CAROTID  Result Date: 07/16/2021 Carotid Arterial Duplex Study Patient Name:  Charles Blake  Date of Exam:   07/16/2021 Medical Rec #: [id number]          Accession #:    [id number] Date of Birth: 1956-10-28           Patient Gender: M Patient Age:   27 years Exam Location:  [facility] Procedure:  VAS US CAROTID Referring Phys: Corene Cornea Rayden Dock --------------------------------------------------------------------------------  Indications:   CAS. Other Factors: Left CEA 2015                08/20/2020 lt ica stent. Performing Technologist: Concha Norway RVT  Examination Guidelines: A complete evaluation includes B-mode imaging, spectral Doppler, color Doppler, and power Doppler as needed of all accessible portions of each vessel. Bilateral testing is considered an integral part of a complete  examination. Limited examinations for reoccurring indications may be performed as noted.  Right Carotid Findings: +----------+--------+--------+--------+------------------+--------+           PSV cm/sEDV cm/sStenosisPlaque DescriptionComments +----------+--------+--------+--------+------------------+--------+ CCA Prox  64      22                                         +----------+--------+--------+--------+------------------+--------+ CCA Mid   86      17                                         +----------+--------+--------+--------+------------------+--------+ CCA Distal80      18                                         +----------+--------+--------+--------+------------------+--------+ ICA Prox  74      26      1-39%   calcific                   +----------+--------+--------+--------+------------------+--------+ ICA Mid   73      24                                         +----------+--------+--------+--------+------------------+--------+ ICA Distal141     41                                         +----------+--------+--------+--------+------------------+--------+ ECA       91      10                                         +----------+--------+--------+--------+------------------+--------+ +----------+--------+-------+----------------+-------------------+           PSV cm/sEDV cmsDescribe        Arm Pressure (mmHG) +----------+--------+-------+----------------+-------------------+ [id number]             Multiphasic, WNL                    +----------+--------+-------+----------------+-------------------+ +---------+--------+--+--------+-+---------+ VertebralPSV cm/s25EDV cm/s9Antegrade +---------+--------+--+--------+-+---------+  Left Carotid Findings: +----------+--------+--------+--------+------------------+--------+           PSV cm/sEDV cm/sStenosisPlaque DescriptionComments  +----------+--------+--------+--------+------------------+--------+ CCA Prox  75      22                                         +----------+--------+--------+--------+------------------+--------+ CCA Mid   68  18                                         +----------+--------+--------+--------+------------------+--------+ CCA Distal66      19                                stent    +----------+--------+--------+--------+------------------+--------+ ICA Prox  70      20                                syent    +----------+--------+--------+--------+------------------+--------+ ICA Mid   82      26                                stent    +----------+--------+--------+--------+------------------+--------+ ICA Distal76      26                                         +----------+--------+--------+--------+------------------+--------+ ECA       97      12                                         +----------+--------+--------+--------+------------------+--------+ +----------+--------+--------+----------------+-------------------+           PSV cm/sEDV cm/sDescribe        Arm Pressure (mmHG) +----------+--------+--------+----------------+-------------------+ [id number]             Multiphasic, WNL                    +----------+--------+--------+----------------+-------------------+ +---------+--------+--+--------+--+---------+ VertebralPSV cm/s70EDV cm/s25Antegrade +---------+--------+--+--------+--+---------+   Summary: Right Carotid: Velocities in the right ICA are consistent with a 1-39% stenosis.                Non-hemodynamically significant plaque <50% noted in the CCA.                Mild calcified plaque in the bulb/proximal ICA. Left Carotid: Velocities in the left ICA are consistent with a 1-39% stenosis.               Widely patent ICA s/p stent. Vertebrals:  Bilateral vertebral arteries demonstrate antegrade flow. Subclavians: Normal flow  hemodynamics were seen in bilateral subclavian              arteries. *See table(s) above for measurements and observations.  Electronically signed by Leotis Pain MD on 07/16/2021 at 10:43:58 AM.    Final    VAS US AORTA/IVC/ILIACS  Result Date: 07/16/2021 ABDOMINAL AORTA STUDY Patient Name:  Charles Blake  Date of Exam:   07/16/2021 Medical Rec #: [id number]          Accession #:    [id number] Date of Birth: 1956/05/13           Patient Gender: M Patient Age:   36 years Exam Location:  [facility] Procedure:      VAS US AORTA/IVC/ILIACS Referring Phys: Eulogio Ditch --------------------------------------------------------------------------------  Vascular Interventions: 05/27/12: Bilateral common iliac artery PTA/stents;  01/19/14: distal aorta & bilateral common iliac artery                         angioplasties.  Performing Technologist: Concha Norway RVT  Examination Guidelines: A complete evaluation includes B-mode imaging, spectral Doppler, color Doppler, and power Doppler as needed of all accessible portions of each vessel. Bilateral testing is considered an integral part of a complete examination. Limited examinations for reoccurring indications may be performed as noted.  Abdominal Aorta Findings: +-------------+-------+----------+----------+----------+--------+--------+ Location     AP (cm)Trans (cm)PSV (cm/s)Waveform  ThrombusComments +-------------+-------+----------+----------+----------+--------+--------+ Proximal     1.84   1.66      79                                   +-------------+-------+----------+----------+----------+--------+--------+ Mid                           77                                   +-------------+-------+----------+----------+----------+--------+--------+ Distal                        183                                  +-------------+-------+----------+----------+----------+--------+--------+ RT CIA Prox  1.2     1.2       198       monophasic                 +-------------+-------+----------+----------+----------+--------+--------+ RT CIA Mid                    166       biphasic                   +-------------+-------+----------+----------+----------+--------+--------+ RT CIA Distal                 199       biphasic                   +-------------+-------+----------+----------+----------+--------+--------+ RT EIA Prox                   145       biphasic                   +-------------+-------+----------+----------+----------+--------+--------+ RT EIA Mid                    133       triphasic                  +-------------+-------+----------+----------+----------+--------+--------+ RT EIA Distal                 126       triphasic                  +-------------+-------+----------+----------+----------+--------+--------+ LT CIA Prox  1.2    1.2       187       biphasic                   +-------------+-------+----------+----------+----------+--------+--------+ LT CIA Mid  110       biphasic                   +-------------+-------+----------+----------+----------+--------+--------+ LT CIA Distal                 92        monophasic                 +-------------+-------+----------+----------+----------+--------+--------+ LT EIA Prox                   128       biphasic                   +-------------+-------+----------+----------+----------+--------+--------+ LT EIA Mid                    93        biphasic                   +-------------+-------+----------+----------+----------+--------+--------+ LT EIA Distal                 83        biphasic                   +-------------+-------+----------+----------+----------+--------+--------+  Summary: Abdominal Aorta: No evidence of an abdominal aortic aneurysm was visualized. The largest aortic measurement is 1.8 cm. Moderate atherosclerosis throughout.  Evidence of patent bilateral CIA stents. Distal aorta with elevated velocities extending to bilateral CIA's suggest moderate stenosis.  *See table(s) above for measurements and observations.  Electronically signed by Leotis Pain MD on 07/16/2021 at 10:44:24 AM.    Final     Assessment/Plan Hypertension blood pressure control important in reducing the progression of atherosclerotic disease. On appropriate oral medications.     Aortic atherosclerosis ([place]) Status post aortoiliac intervention several years ago and doing well.   Hyperlipidemia lipid control important in reducing the progression of atherosclerotic disease. Continue statin therapy  Carotid artery stenosis Duplex today shows the left carotid stent to be widely patent.  Right carotid artery stenosis is stable in the 1 to 39% range.  Overall doing well.  We are about 1 year out from his carotid stent.  We can now follow this annually.  Continue current medical regimen which includes Plavix and a statin agent.  PAD (peripheral artery disease) (HCC) ABIs today are stable at 1.06 on the right and 1.1 on the left with triphasic waveforms and normal digital pressures.  Duplex shows some mild elevated velocities in the common iliac artery stents that may demonstrate some mild to moderate restenosis.  Symptoms are fairly mild at this point.  Continue current medical regimen.  Recheck in 1 year.    Leotis Pain, MD  07/16/2021 11:53 AM    This note was created with Dragon medical transcription system.  Any errors from dictation are purely unintentional

## 2021-07-16 NOTE — Assessment & Plan Note (Signed)
ABIs today are stable at 1.06 on the right and 1.1 on the left with triphasic waveforms and normal digital pressures.  Duplex shows some mild elevated velocities in the common iliac artery stents that may demonstrate some mild to moderate restenosis.  Symptoms are fairly mild at this point.  Continue current medical regimen.  Recheck in 1 year.

## 2021-08-05 ENCOUNTER — Ambulatory Visit (INDEPENDENT_AMBULATORY_CARE_PROVIDER_SITE_OTHER): Payer: Medicare Other | Admitting: Internal Medicine

## 2021-08-05 ENCOUNTER — Other Ambulatory Visit: Payer: Self-pay

## 2021-08-05 VITALS — BP 157/80 | HR 77 | Resp 18 | Ht 67.0 in | Wt 231.0 lb

## 2021-08-05 DIAGNOSIS — Z7189 Other specified counseling: Secondary | ICD-10-CM | POA: Diagnosis not present

## 2021-08-05 DIAGNOSIS — G4733 Obstructive sleep apnea (adult) (pediatric): Secondary | ICD-10-CM | POA: Diagnosis not present

## 2021-08-05 DIAGNOSIS — I6523 Occlusion and stenosis of bilateral carotid arteries: Secondary | ICD-10-CM

## 2021-08-05 DIAGNOSIS — Z9989 Dependence on other enabling machines and devices: Secondary | ICD-10-CM

## 2021-08-05 DIAGNOSIS — I10 Essential (primary) hypertension: Secondary | ICD-10-CM | POA: Diagnosis not present

## 2021-08-05 DIAGNOSIS — E669 Obesity, unspecified: Secondary | ICD-10-CM

## 2021-08-05 NOTE — Patient Instructions (Signed)

## 2021-08-05 NOTE — Progress Notes (Signed)
[facility] [place], [place] [address]  Pulmonary Sleep Medicine   Office Visit Note  Patient Name: Charles Blake DOB: 07-31-1956 MRN [id number]    Chief Complaint: Obstructive Sleep Apnea visit  Brief History:  Charles Blake is seen today for one year followup on PAP therapy. The patient has a 5 year history of sleep apnea. Patient is using PAP nightly @ CPAP 20 cmH2O.  The patient feels pretty good after sleeping with PAP.  The patient reports benefiting from PAP use because he doesn't feel sleepy/sluggish anymore. the Epworth Sleepiness Score is 8 out of 24. The patient only takes  naps 1 - 2 times monthly for 1-2 hours.. The patient complains of the following: high mask leaks  The compliance download shows  compliance with an average use time of 9:45 hours @ 100% . The AHI is 8.8  The patient does not complain of limb movements disrupting sleep.  ROS  General: (-) fever, (-) chills, (-) night sweat Nose and Sinuses: (-) nasal stuffiness or itchiness, (-) postnasal drip, (-) nosebleeds, (-) sinus trouble. Mouth and Throat: (-) sore throat, (-) hoarseness. Neck: (-) swollen glands, (-) enlarged thyroid, (-) neck pain. Respiratory: - cough, + shortness of breath, - wheezing. Neurologic: - numbness, - tingling. Psychiatric: - anxiety, - depression   Current Medication: Outpatient Encounter Medications as of 08/05/2021  Medication Sig Note   acetaminophen (TYLENOL) 650 MG CR tablet Take 650 mg by mouth every 8 (eight) hours as needed for pain.    albuterol (PROVENTIL HFA;VENTOLIN HFA) 108 (90 Base) MCG/ACT inhaler Inhale 2 puffs into the lungs every 4 (four) hours as needed for wheezing.    amLODipine (NORVASC) 10 MG tablet Take 10 mg by mouth daily. am    aspirin 81 MG tablet Take 81 mg by mouth daily. am (Patient not taking: Reported on 07/16/2021)    atorvastatin (LIPITOR) 40 MG tablet Take 40 mg by mouth. am (Patient not taking: Reported on 07/16/2021)     carvedilol (COREG) 3.125 MG tablet Take 3.125 mg by mouth 2 (two) times daily with a meal. Am and pm    clonazePAM (KLONOPIN) 0.5 MG tablet Take 0.5 mg by mouth as needed.  05/24/2018: Been a while    clopidogrel (PLAVIX) 75 MG tablet Take 1 tablet (75 mg total) by mouth daily.    cyclobenzaprine (FLEXERIL) 10 MG tablet Take 1 tablet (10 mg total) by mouth 3 (three) times daily as needed. (Patient not taking: Reported on 07/16/2021)    Doxycycline Hyclate 200 MG TBEC  (Patient not taking: Reported on 07/16/2021)    escitalopram (LEXAPRO) 20 MG tablet Take 20 mg by mouth daily. Am    fluconazole (DIFLUCAN) 200 MG tablet Take 200 mg by mouth once a week.  (Patient not taking: Reported on 07/16/2021)    fluticasone (FLONASE) 50 MCG/ACT nasal spray Place into the nose.    furosemide (LASIX) 20 MG tablet Take by mouth.    hydrochlorothiazide (HYDRODIURIL) 25 MG tablet  (Patient not taking: Reported on 07/16/2021)    HYDROcodone-acetaminophen (NORCO/VICODIN) 5-325 MG tablet Take 1 tablet by mouth every 4 (four) hours as needed. (Patient not taking: Reported on 07/16/2021)    isosorbide mononitrate (IMDUR) 60 MG 24 hr tablet Take by mouth. (Patient not taking: Reported on 07/16/2021)    ketoconazole (NIZORAL) 2 % shampoo APPLY TOPICALLY DIRECTLY TO SCALP EVERY OTHER DAY ALLOW TO SIT FOR 30 60 MINUTES BEFORE RINSING    ketorolac (TORADOL) 10 MG tablet  Take 1 tablet (10 mg total) by mouth every 6 (six) hours as needed. (Patient not taking: Reported on 07/16/2021)    losartan-hydrochlorothiazide (HYZAAR) 100-25 MG per tablet Take 1 tablet by mouth daily. am    lovastatin (MEVACOR) 20 MG tablet Take 20 mg by mouth at bedtime.    Melatonin 3 MG CAPS Take by mouth. pm    Multiple Vitamin (MULTI-VITAMINS) TABS Take by mouth.    nitroGLYCERIN (NITROSTAT) 0.4 MG SL tablet SMARTSIG:1 Tablet(s) Sublingual PRN    Omega-3 Fatty Acids (FISH OIL) 1000 MG CAPS Take by mouth daily. am    pantoprazole (PROTONIX) 40 MG tablet Take 40 mg  by mouth daily. am    pravastatin (PRAVACHOL) 80 MG tablet Take 80 mg by mouth daily. (Patient not taking: Reported on 07/16/2021)    tadalafil (CIALIS) 20 MG tablet Take 20 mg by mouth daily as needed for erectile dysfunction. (Patient not taking: Reported on 07/16/2021)    telmisartan-hydrochlorothiazide (MICARDIS HCT) 80-12.5 MG tablet Take 1 tablet by mouth daily.    tiotropium (SPIRIVA) 18 MCG inhalation capsule Place 18 mcg into inhaler and inhale daily. am    traMADol (ULTRAM) 50 MG tablet Take 1 tablet (50 mg total) by mouth every 6 (six) hours as needed for moderate pain. (Patient not taking: Reported on 07/16/2021)    No facility-administered encounter medications on file as of 08/05/2021.    Surgical History: Past Surgical History:  Procedure Laterality Date   APPENDECTOMY     CARDIAC CATHETERIZATION     CAROTID ENDARTERECTOMY Left    CAROTID PTA/STENT INTERVENTION Left 08/20/2020   Procedure: CAROTID PTA/STENT INTERVENTION;  Surgeon: Algernon Huxley, MD;  Location: [facility];  Service: Cardiovascular;  Laterality: Left;   CATARACT EXTRACTION Left    CATARACT EXTRACTION W/PHACO Right 05/24/2018   Procedure: CATARACT EXTRACTION PHACO AND INTRAOCULAR LENS PLACEMENT ([place]) RIGHT;  Surgeon: Eulogio Bear, MD;  Location: [facility];  Service: Ophthalmology;  Laterality: Right;  CPAP, sleep apnea   COLONOSCOPY N/A 05/29/2015   Procedure: COLONOSCOPY;  Surgeon: Lollie Sails, MD;  Location: [facility];  Service: Endoscopy;  Laterality: N/A;   iliac stents     TONSILLECTOMY      Medical History: Past Medical History:  Diagnosis Date   Anemia    Anxiety    Arthritis    fingers and hands   Cancer (HCC)    skin cancer   COPD (chronic obstructive pulmonary disease) (HCC)    wheezing   Coronary artery disease    Dyspnea    GERD (gastroesophageal reflux disease)    Headache    2-3 times/ week   History of hiatal hernia    HOH (hard of hearing)     Hypercholesteremia    Hypertension    controlled   Pneumonia    Sleep apnea    CPAP   Wears dentures    upper    Family History: Non contributory to the present illness  Social History: Social History   Socioeconomic History   Marital status: Married    Spouse name: Bretta Bang    Number of children: 1   Years of education: Not on file   Highest education level: Not on file  Occupational History   Occupation: retired   Tobacco Use   Smoking status: Former    Packs/day: 0.50    Years: 40.00    Pack years: 20.00    Types: Cigarettes    Quit date: 11/09/2020  Years since quitting: 0.7   Smokeless tobacco: Former  Substance and Sexual Activity   Alcohol use: Yes    Comment: rarely   Drug use: Never   Sexual activity: Not on file  Other Topics Concern   Not on file  Social History Narrative   Lives at home with wife.    Social Determinants of Health   Financial Resource Strain: Not on file  Food Insecurity: Not on file  Transportation Needs: Not on file  Physical Activity: Not on file  Stress: Not on file  Social Connections: Not on file  Intimate Partner Violence: Not on file    Vital Signs: There were no vitals taken for this visit.  Examination: General Appearance: The patient is well-developed, well-nourished, and in no distress. Neck Circumference: 51 cm Skin: Gross inspection of skin unremarkable. Head: normocephalic, no gross deformities. Eyes: no gross deformities noted. ENT: ears appear grossly normal Neurologic: Alert and oriented. No involuntary movements.    EPWORTH SLEEPINESS SCALE:  Scale:  (0)= no chance of dozing; (1)= slight chance of dozing; (2)= moderate chance of dozing; (3)= high chance of dozing  Chance  Situtation    Sitting and reading: 1    Watching TV: 1    Sitting Inactive in public: 1    As a passenger in car: 1      Lying down to rest: 1    Sitting and talking: 1    Sitting quielty after lunch: 1     In a car, stopped in traffic: 1   TOTAL SCORE:   8 out of 24    SLEEP STUDIES:  PSG 07/2016, AHI 13, SpO2 minimum 81%  CPAP COMPLIANCE DATA:  Date Range: 08/01/20 - 07/31/21  Average Daily Use: 9:45 hours  Median Use: 9:36 hours  Compliance for > 4 Hours: 100%  AHI: 8.8 lpm respiratory events per hour  Days Used: 364/365  Mask Leak: 83.1  95th Percentile Pressure: 20 cmH2O    LABS: No results found for this or any previous visit (from the past 2160 hour(s)).  Radiology: CT CHEST LUNG CANCER SCREENING LOW DOSE WO CONTRAST  Result Date: 12/11/2020 CLINICAL DATA:  65 year old male former smoker, quit 1 year ago, with 52 pack-year history of smoking, for initial lung cancer screening EXAM: CT CHEST WITHOUT CONTRAST LOW-DOSE FOR LUNG CANCER SCREENING TECHNIQUE: Multidetector CT imaging of the chest was performed following the standard protocol without IV contrast. COMPARISON:  None. FINDINGS: Cardiovascular: Heart is normal in size. Small pericardial effusion. No evidence of thoracic aortic aneurysm. Atherosclerotic calcifications of the aortic arch. Three vessel coronary atherosclerosis. Mediastinum/Nodes: No suspicious mediastinal lymphadenopathy. Visualized thyroid is unremarkable. Lungs/Pleura: Two left lower lobe pulmonary nodules measuring up to 4.4 mm. Mild centrilobular emphysematous changes, upper lung predominant. No focal consolidation. No pleural effusion or pneumothorax. Upper Abdomen: Visualized upper abdomen is notable for hepatic steatosis and vascular calcifications. Musculoskeletal: Mild degenerative changes of the lumbar spine. IMPRESSION: Lung-RADS 2, benign appearance or behavior. Continue annual screening with low-dose chest CT without contrast in 12 months. Aortic Atherosclerosis (ICD10-I70.0) and Emphysema (ICD10-J43.9). Electronically Signed   By: Julian Hy M.D.   On: 12/11/2020 09:56    No results found.  VAS [place] ABI WITH/WO TBI  Result Date:  07/26/2021  LOWER EXTREMITY DOPPLER STUDY Patient Name:  SING HISE  Date of Exam:   07/16/2021 Medical Rec #: [id number]          Accession #:    [id number] Date of Birth:  Jul 18, 1956           Patient Gender: M Patient Age:   13 years Exam Location:  [facility] Procedure:      VAS [place] ABI WITH/WO TBI Referring Phys: Eulogio Ditch --------------------------------------------------------------------------------  Indications: Claudication, and peripheral artery disease.  Vascular Interventions: 05/27/12: Bilateral common iliac artery PTA/stents;                         01/19/14: distal aorta & bilateral common iliac artery                         angioplasties. Comparison Study: 07/17/20 Performing Technologist: Concha Norway RVT  Examination Guidelines: A complete evaluation includes at minimum, Doppler waveform signals and systolic blood pressure reading at the level of bilateral brachial, anterior tibial, and posterior tibial arteries, when vessel segments are accessible. Bilateral testing is considered an integral part of a complete examination. Photoelectric Plethysmograph (PPG) waveforms and toe systolic pressure readings are included as required and additional duplex testing as needed. Limited examinations for reoccurring indications may be performed as noted.  ABI Findings: +---------+------------------+-----+---------+--------+ Right    Rt Pressure (mmHg)IndexWaveform Comment  +---------+------------------+-----+---------+--------+ Brachial 154                                      +---------+------------------+-----+---------+--------+ ATA      161                             1.05     +---------+------------------+-----+---------+--------+ PTA      164               1.06 triphasic         +---------+------------------+-----+---------+--------+ Great Toe148               0.96 Normal            +---------+------------------+-----+---------+--------+  +---------+------------------+-----+---------+-------+ Left     Lt Pressure (mmHg)IndexWaveform Comment +---------+------------------+-----+---------+-------+ ATA      166                             1.07    +---------+------------------+-----+---------+-------+ PTA      169               1.10 triphasic        +---------+------------------+-----+---------+-------+ Great Toe145               0.94 Normal           +---------+------------------+-----+---------+-------+ +-------+-----------+-----------+------------+------------+ ABI/TBIToday's ABIToday's TBIPrevious ABIPrevious TBI +-------+-----------+-----------+------------+------------+ Right  1.06       .96        1.03        .99          +-------+-----------+-----------+------------+------------+ Left   1.10       .94        1.10        1.07         +-------+-----------+-----------+------------+------------+  Bilateral ABIs and TBIs appear essentially unchanged compared to prior study on 07/2020.  Summary: Right: Resting right ankle-brachial index is within normal range. No evidence of significant right lower extremity arterial disease. The right toe-brachial index is normal. Left: Resting left ankle-brachial index is within normal range. No evidence of significant left  lower extremity arterial disease. The left toe-brachial index is normal.  *See table(s) above for measurements and observations.  Electronically signed by Leotis Pain MD on 07/26/2021 at 10:12:09 AM.    Final    VAS US CAROTID  Result Date: 07/16/2021 Carotid Arterial Duplex Study Patient Name:  GAETAN UHLS  Date of Exam:   07/16/2021 Medical Rec #: [id number]          Accession #:    [id number] Date of Birth: 07-Oct-1956           Patient Gender: M Patient Age:   89 years Exam Location:  [facility] Procedure:      VAS US CAROTID Referring Phys: Leotis Pain --------------------------------------------------------------------------------   Indications:   CAS. Other Factors: Left CEA 2015                08/20/2020 lt ica stent. Performing Technologist: Concha Norway RVT  Examination Guidelines: A complete evaluation includes B-mode imaging, spectral Doppler, color Doppler, and power Doppler as needed of all accessible portions of each vessel. Bilateral testing is considered an integral part of a complete examination. Limited examinations for reoccurring indications may be performed as noted.  Right Carotid Findings: +----------+--------+--------+--------+------------------+--------+           PSV cm/sEDV cm/sStenosisPlaque DescriptionComments +----------+--------+--------+--------+------------------+--------+ CCA Prox  64      22                                         +----------+--------+--------+--------+------------------+--------+ CCA Mid   86      17                                         +----------+--------+--------+--------+------------------+--------+ CCA Distal80      18                                         +----------+--------+--------+--------+------------------+--------+ ICA Prox  74      26      1-39%   calcific                   +----------+--------+--------+--------+------------------+--------+ ICA Mid   73      24                                         +----------+--------+--------+--------+------------------+--------+ ICA Distal141     41                                         +----------+--------+--------+--------+------------------+--------+ ECA       91      10                                         +----------+--------+--------+--------+------------------+--------+ +----------+--------+-------+----------------+-------------------+           PSV cm/sEDV cmsDescribe        Arm Pressure (mmHG) +----------+--------+-------+----------------+-------------------+ [id number]  Multiphasic, WNL                     +----------+--------+-------+----------------+-------------------+ +---------+--------+--+--------+-+---------+ VertebralPSV cm/s25EDV cm/s9Antegrade +---------+--------+--+--------+-+---------+  Left Carotid Findings: +----------+--------+--------+--------+------------------+--------+           PSV cm/sEDV cm/sStenosisPlaque DescriptionComments +----------+--------+--------+--------+------------------+--------+ CCA Prox  75      22                                         +----------+--------+--------+--------+------------------+--------+ CCA Mid   68      18                                         +----------+--------+--------+--------+------------------+--------+ CCA Distal66      19                                stent    +----------+--------+--------+--------+------------------+--------+ ICA Prox  70      20                                syent    +----------+--------+--------+--------+------------------+--------+ ICA Mid   82      26                                stent    +----------+--------+--------+--------+------------------+--------+ ICA Distal76      26                                         +----------+--------+--------+--------+------------------+--------+ ECA       97      12                                         +----------+--------+--------+--------+------------------+--------+ +----------+--------+--------+----------------+-------------------+           PSV cm/sEDV cm/sDescribe        Arm Pressure (mmHG) +----------+--------+--------+----------------+-------------------+ [id number]             Multiphasic, WNL                    +----------+--------+--------+----------------+-------------------+ +---------+--------+--+--------+--+---------+ VertebralPSV cm/s70EDV cm/s25Antegrade +---------+--------+--+--------+--+---------+   Summary: Right Carotid: Velocities in the right ICA are consistent with a 1-39%  stenosis.                Non-hemodynamically significant plaque <50% noted in the CCA.                Mild calcified plaque in the bulb/proximal ICA. Left Carotid: Velocities in the left ICA are consistent with a 1-39% stenosis.               Widely patent ICA s/p stent. Vertebrals:  Bilateral vertebral arteries demonstrate antegrade flow. Subclavians: Normal flow hemodynamics were seen in bilateral subclavian              arteries. *See table(s) above for measurements and observations.  Electronically signed by Leotis Pain MD on 07/16/2021 at 10:43:58  AM.    Final    VAS US AORTA/IVC/ILIACS  Result Date: 07/16/2021 ABDOMINAL AORTA STUDY Patient Name:  KAMRYN BENSHOOF  Date of Exam:   07/16/2021 Medical Rec #: [id number]          Accession #:    [id number] Date of Birth: 31-Jan-1956           Patient Gender: M Patient Age:   59 years Exam Location:  [facility] Procedure:      VAS US AORTA/IVC/ILIACS Referring Phys: Eulogio Ditch --------------------------------------------------------------------------------  Vascular Interventions: 05/27/12: Bilateral common iliac artery PTA/stents;                         01/19/14: distal aorta & bilateral common iliac artery                         angioplasties.  Performing Technologist: Concha Norway RVT  Examination Guidelines: A complete evaluation includes B-mode imaging, spectral Doppler, color Doppler, and power Doppler as needed of all accessible portions of each vessel. Bilateral testing is considered an integral part of a complete examination. Limited examinations for reoccurring indications may be performed as noted.  Abdominal Aorta Findings: +-------------+-------+----------+----------+----------+--------+--------+ Location     AP (cm)Trans (cm)PSV (cm/s)Waveform  ThrombusComments +-------------+-------+----------+----------+----------+--------+--------+ Proximal     1.84   1.66      79                                    +-------------+-------+----------+----------+----------+--------+--------+ Mid                           77                                   +-------------+-------+----------+----------+----------+--------+--------+ Distal                        183                                  +-------------+-------+----------+----------+----------+--------+--------+ RT CIA Prox  1.2    1.2       198       monophasic                 +-------------+-------+----------+----------+----------+--------+--------+ RT CIA Mid                    166       biphasic                   +-------------+-------+----------+----------+----------+--------+--------+ RT CIA Distal                 199       biphasic                   +-------------+-------+----------+----------+----------+--------+--------+ RT EIA Prox                   145       biphasic                   +-------------+-------+----------+----------+----------+--------+--------+ RT EIA Mid  133       triphasic                  +-------------+-------+----------+----------+----------+--------+--------+ RT EIA Distal                 126       triphasic                  +-------------+-------+----------+----------+----------+--------+--------+ LT CIA Prox  1.2    1.2       187       biphasic                   +-------------+-------+----------+----------+----------+--------+--------+ LT CIA Mid                    110       biphasic                   +-------------+-------+----------+----------+----------+--------+--------+ LT CIA Distal                 92        monophasic                 +-------------+-------+----------+----------+----------+--------+--------+ LT EIA Prox                   128       biphasic                   +-------------+-------+----------+----------+----------+--------+--------+ LT EIA Mid                    93        biphasic                    +-------------+-------+----------+----------+----------+--------+--------+ LT EIA Distal                 83        biphasic                   +-------------+-------+----------+----------+----------+--------+--------+  Summary: Abdominal Aorta: No evidence of an abdominal aortic aneurysm was visualized. The largest aortic measurement is 1.8 cm. Moderate atherosclerosis throughout. Evidence of patent bilateral CIA stents. Distal aorta with elevated velocities extending to bilateral CIA's suggest moderate stenosis.  *See table(s) above for measurements and observations.  Electronically signed by Leotis Pain MD on 07/16/2021 at 10:44:24 AM.    Final       Assessment and Plan: Patient Active Problem List   Diagnosis Date Noted   COPD (chronic obstructive pulmonary disease) ([place]) 11/05/2020   Carotid stenosis, left 08/20/2020   Hyperlipidemia 07/06/2018   Hypertension 07/07/2017   Aortic atherosclerosis ([place]) 07/07/2017   Carotid artery stenosis 07/07/2017   PAD (peripheral artery disease) ([place]) 07/07/2017   Benign essential hypertension 06/14/2015   Coronary artery disease 02/06/2015   Complete left bundle branch block 01/19/2015   Psoriasis 01/10/2015   OSA (obstructive sleep apnea) 12/19/2014   Anxiety 07/06/2014   GERD (gastroesophageal reflux disease) 07/06/2014   Fracture of metatarsal of left foot, open 12/06/2013   1. OSA on CPAP The patient does tolerate PAP and reports  benefit from PAP use. The patient was reminded how to clean equipment  and advised to replace supplies routinely. He has high leak and will be set up for a mask fitting and a 4 week download. . The patient was also counselled on weight loss. The compliance is compliance. The AHI is 8.8.  OSA- continue excellent compliance. Mask fitting. 4 week download.    2. CPAP use counseling CPAP Counseling: had a lengthy discussion with the patient regarding the importance of PAP therapy in management of the sleep apnea.  Patient appears to understand the risk factor reduction and also understands the risks associated with untreated sleep apnea. Patient will try to make a good faith effort to remain compliant with therapy. Also instructed the patient on proper cleaning of the device including the water must be changed daily if possible and use of distilled water is preferred. Patient understands that the machine should be regularly cleaned with appropriate recommended cleaning solutions that do not damage the PAP machine for example given white vinegar and water rinses. Other methods such as ozone treatment may not be as good as these simple methods to achieve cleaning.   3. Obesity (BMI 30-39.9) Obesity Counseling: Had a lengthy discussion regarding patients BMI and weight issues. Patient was instructed on portion control as well as increased activity. Also discussed caloric restrictions with trying to maintain intake less than 2000 Kcal. Discussions were made in accordance with the 5As of weight management. Simple actions such as not eating late and if able to, taking a walk is suggested.   4. Benign essential hypertension Hypertension Counseling:   The following hypertensive lifestyle modification were recommended and discussed:  1. Limiting alcohol intake to less than 1 oz/day of ethanol:(24 oz of beer or 8 oz of wine or 2 oz of 100-proof whiskey). 2. Take baby ASA 81 mg daily. 3. Importance of regular aerobic exercise and losing weight. 4. Reduce dietary saturated fat and cholesterol intake for overall cardiovascular health. 5. Maintaining adequate dietary potassium, calcium, and magnesium intake. 6. Regular monitoring of the blood pressure. 7. Reduce sodium intake to less than 100 mmol/day (less than 2.3 gm of sodium or less than 6 gm of sodium choride)      General Counseling: I have discussed the findings of the evaluation and examination with Charles Blake.  I have also discussed any further diagnostic evaluation  thatmay be needed or ordered today. Charles Blake verbalizes understanding of the findings of todays visit. We also reviewed his medications today and discussed drug interactions and side effects including but not limited excessive drowsiness and altered mental states. We also discussed that there is always a risk not just to him but also people around him. he has been encouraged to call the office with any questions or concerns that should arise related to todays visit.  No orders of the defined types were placed in this encounter.       I have personally obtained a history, examined the patient, evaluated laboratory and imaging results, formulated the assessment and plan and placed orders.   This patient was seen today by Tressie Ellis, PA-C in collaboration with Dr. Devona Konig.    Allyne Gee, MD [facility] Diplomate ABMS Pulmonary and Critical Care Medicine Sleep medicine

## 2021-08-08 ENCOUNTER — Other Ambulatory Visit (INDEPENDENT_AMBULATORY_CARE_PROVIDER_SITE_OTHER): Payer: Self-pay | Admitting: Vascular Surgery

## 2021-11-03 ENCOUNTER — Other Ambulatory Visit (INDEPENDENT_AMBULATORY_CARE_PROVIDER_SITE_OTHER): Payer: Self-pay | Admitting: Vascular Surgery

## 2021-11-24 ENCOUNTER — Other Ambulatory Visit (INDEPENDENT_AMBULATORY_CARE_PROVIDER_SITE_OTHER): Payer: Self-pay | Admitting: Vascular Surgery

## 2021-11-29 ENCOUNTER — Inpatient Hospital Stay
Admission: EM | Admit: 2021-11-29 | Discharge: 2021-12-03 | DRG: 282 | Disposition: A | Discharge status: Other Acute Inpatient Hospital | Payer: Medicare Other | Attending: Hospitalist | Admitting: Hospitalist

## 2021-11-29 ENCOUNTER — Ambulatory Visit
Admission: EM | Admit: 2021-11-29 | Discharge: 2021-11-29 | Disposition: A | Discharge status: Home / Self Care | Payer: Medicare Other | Attending: Emergency Medicine | Admitting: Emergency Medicine

## 2021-11-29 ENCOUNTER — Emergency Department: Payer: Medicare Other

## 2021-11-29 ENCOUNTER — Encounter: Payer: Self-pay | Admitting: Emergency Medicine

## 2021-11-29 ENCOUNTER — Other Ambulatory Visit: Payer: Self-pay

## 2021-11-29 DIAGNOSIS — Z888 Allergy status to other drugs, medicaments and biological substances status: Secondary | ICD-10-CM | POA: Diagnosis not present

## 2021-11-29 DIAGNOSIS — G4733 Obstructive sleep apnea (adult) (pediatric): Secondary | ICD-10-CM | POA: Diagnosis present

## 2021-11-29 DIAGNOSIS — Z7951 Long term (current) use of inhaled steroids: Secondary | ICD-10-CM

## 2021-11-29 DIAGNOSIS — Z79899 Other long term (current) drug therapy: Secondary | ICD-10-CM | POA: Diagnosis not present

## 2021-11-29 DIAGNOSIS — Z9049 Acquired absence of other specified parts of digestive tract: Secondary | ICD-10-CM

## 2021-11-29 DIAGNOSIS — Z7982 Long term (current) use of aspirin: Secondary | ICD-10-CM

## 2021-11-29 DIAGNOSIS — Z7902 Long term (current) use of antithrombotics/antiplatelets: Secondary | ICD-10-CM | POA: Diagnosis not present

## 2021-11-29 DIAGNOSIS — Z9841 Cataract extraction status, right eye: Secondary | ICD-10-CM

## 2021-11-29 DIAGNOSIS — R079 Chest pain, unspecified: Secondary | ICD-10-CM

## 2021-11-29 DIAGNOSIS — R339 Retention of urine, unspecified: Secondary | ICD-10-CM | POA: Diagnosis not present

## 2021-11-29 DIAGNOSIS — I214 Non-ST elevation (NSTEMI) myocardial infarction: Principal | ICD-10-CM | POA: Diagnosis present

## 2021-11-29 DIAGNOSIS — Z20822 Contact with and (suspected) exposure to covid-19: Secondary | ICD-10-CM | POA: Diagnosis present

## 2021-11-29 DIAGNOSIS — I200 Unstable angina: Secondary | ICD-10-CM | POA: Diagnosis present

## 2021-11-29 DIAGNOSIS — K219 Gastro-esophageal reflux disease without esophagitis: Secondary | ICD-10-CM | POA: Diagnosis present

## 2021-11-29 DIAGNOSIS — E7800 Pure hypercholesterolemia, unspecified: Secondary | ICD-10-CM | POA: Diagnosis present

## 2021-11-29 DIAGNOSIS — F17210 Nicotine dependence, cigarettes, uncomplicated: Secondary | ICD-10-CM | POA: Diagnosis present

## 2021-11-29 DIAGNOSIS — I447 Left bundle-branch block, unspecified: Secondary | ICD-10-CM | POA: Diagnosis present

## 2021-11-29 DIAGNOSIS — I10 Essential (primary) hypertension: Secondary | ICD-10-CM | POA: Diagnosis present

## 2021-11-29 DIAGNOSIS — I25110 Atherosclerotic heart disease of native coronary artery with unstable angina pectoris: Secondary | ICD-10-CM | POA: Diagnosis present

## 2021-11-29 DIAGNOSIS — Z961 Presence of intraocular lens: Secondary | ICD-10-CM | POA: Diagnosis present

## 2021-11-29 DIAGNOSIS — Z85828 Personal history of other malignant neoplasm of skin: Secondary | ICD-10-CM | POA: Diagnosis not present

## 2021-11-29 DIAGNOSIS — Z8249 Family history of ischemic heart disease and other diseases of the circulatory system: Secondary | ICD-10-CM

## 2021-11-29 DIAGNOSIS — Z9842 Cataract extraction status, left eye: Secondary | ICD-10-CM

## 2021-11-29 DIAGNOSIS — J449 Chronic obstructive pulmonary disease, unspecified: Secondary | ICD-10-CM | POA: Diagnosis present

## 2021-11-29 DIAGNOSIS — I739 Peripheral vascular disease, unspecified: Secondary | ICD-10-CM | POA: Diagnosis present

## 2021-11-29 DIAGNOSIS — F419 Anxiety disorder, unspecified: Secondary | ICD-10-CM | POA: Diagnosis present

## 2021-11-29 LAB — CBC
HCT: 42.6 % (ref 39.0–52.0)
Hemoglobin: 14.9 g/dL (ref 13.0–17.0)
MCH: 32.5 pg (ref 26.0–34.0)
MCHC: 35 g/dL (ref 30.0–36.0)
MCV: 93 fL (ref 80.0–100.0)
Platelets: 225 10*3/uL (ref 150–400)
RBC: 4.58 MIL/uL (ref 4.22–5.81)
RDW: 12.2 % (ref 11.5–15.5)
WBC: 10.3 10*3/uL (ref 4.0–10.5)
nRBC: 0 % (ref 0.0–0.2)

## 2021-11-29 LAB — BASIC METABOLIC PANEL
Anion gap: 5 (ref 5–15)
BUN: 15 mg/dL (ref 8–23)
CO2: 26 mmol/L (ref 22–32)
Calcium: 8.9 mg/dL (ref 8.9–10.3)
Chloride: 106 mmol/L (ref 98–111)
Creatinine, Ser: 1.1 mg/dL (ref 0.61–1.24)
GFR, Estimated: >60 mL/min (ref >60)
Glucose, Bld: 117 mg/dL — ABNORMAL HIGH (ref 70–99)
Potassium: 3.9 mmol/L (ref 3.5–5.1)
Sodium: 137 mmol/L (ref 135–145)

## 2021-11-29 LAB — RESP PANEL BY RT-PCR (FLU A&B, COVID) ARPGX2
Influenza A by PCR: NEGATIVE
Influenza B by PCR: NEGATIVE
SARS Coronavirus 2 by RT PCR: NEGATIVE

## 2021-11-29 LAB — TROPONIN I (HIGH SENSITIVITY)
Troponin I (High Sensitivity): 222 ng/L (ref <18)
Troponin I (High Sensitivity): 901 ng/L (ref <18)
Troponin I (High Sensitivity): 1501 ng/L (ref <18)

## 2021-11-29 LAB — APTT: aPTT: 28 seconds (ref 24–36)

## 2021-11-29 LAB — PROTIME-INR
INR: 1 (ref 0.8–1.2)
Prothrombin Time: 13.1 seconds (ref 11.4–15.2)

## 2021-11-29 MED ORDER — ASPIRIN 325 MG PO TABS
325.0000 mg | ORAL_TABLET | Freq: Every day | ORAL | Status: DC
  Administered 2021-11-29 – 2021-11-30 (×2): 325 mg via ORAL
  Filled 2021-11-29 (×3): qty 1

## 2021-11-29 MED ORDER — PANTOPRAZOLE SODIUM 40 MG PO TBEC
40.0000 mg | DELAYED_RELEASE_TABLET | Freq: Every day | ORAL | Status: DC
  Administered 2021-11-30 – 2021-12-03 (×4): 40 mg via ORAL
  Filled 2021-11-29 (×4): qty 1

## 2021-11-29 MED ORDER — FUROSEMIDE 20 MG PO TABS
20.0000 mg | ORAL_TABLET | Freq: Every day | ORAL | Status: DC
  Administered 2021-11-30 – 2021-12-03 (×4): 20 mg via ORAL
  Filled 2021-11-29 (×4): qty 1

## 2021-11-29 MED ORDER — MELATONIN 5 MG PO TABS
2.5000 mg | ORAL_TABLET | Freq: Every day | ORAL | Status: DC
  Administered 2021-11-30 – 2021-12-02 (×3): 2.5 mg via ORAL
  Filled 2021-11-29 (×4): qty 1

## 2021-11-29 MED ORDER — HEPARIN BOLUS VIA INFUSION
4000.0000 [IU] | Freq: Once | INTRAVENOUS | Status: AC
  Administered 2021-11-29: 19:00:00 4000 [IU] via INTRAVENOUS
  Filled 2021-11-29: qty 4000

## 2021-11-29 MED ORDER — ESCITALOPRAM OXALATE 10 MG PO TABS
10.0000 mg | ORAL_TABLET | Freq: Every morning | ORAL | Status: DC
  Administered 2021-11-30 – 2021-12-03 (×3): 10 mg via ORAL
  Filled 2021-11-29 (×4): qty 1

## 2021-11-29 MED ORDER — ONDANSETRON HCL 4 MG/2ML IJ SOLN: 4.0000 mg | Freq: Four times a day (QID) | INTRAMUSCULAR | Status: DC | PRN

## 2021-11-29 MED ORDER — TELMISARTAN-HCTZ 80-12.5 MG PO TABS: 1.0000 | ORAL_TABLET | Freq: Every day | ORAL | Status: DC

## 2021-11-29 MED ORDER — ONDANSETRON 4 MG PO TBDP
4.0000 mg | ORAL_TABLET | Freq: Three times a day (TID) | ORAL | Status: DC | PRN
  Filled 2021-11-29: qty 1

## 2021-11-29 MED ORDER — HEPARIN (PORCINE) 25000 UT/250ML-% IV SOLN
1500.0000 [IU]/h | INTRAVENOUS | Status: DC
Start: 2021-11-29 — End: 2021-12-03
  Administered 2021-11-29: 19:00:00 1100 [IU]/h via INTRAVENOUS
  Administered 2021-12-01 – 2021-12-02 (×3): 1350 [IU]/h via INTRAVENOUS
  Filled 2021-11-29 (×5): qty 250

## 2021-11-29 MED ORDER — UMECLIDINIUM-VILANTEROL 62.5-25 MCG/ACT IN AEPB
1.0000 | INHALATION_SPRAY | Freq: Every day | RESPIRATORY_TRACT | Status: DC
  Administered 2021-11-30 – 2021-12-03 (×4): 1 via RESPIRATORY_TRACT
  Filled 2021-11-29: qty 14

## 2021-11-29 MED ORDER — AMLODIPINE BESYLATE 5 MG PO TABS
5.0000 mg | ORAL_TABLET | Freq: Every day | ORAL | Status: DC
  Administered 2021-11-30 – 2021-12-03 (×4): 5 mg via ORAL
  Filled 2021-11-29 (×4): qty 1

## 2021-11-29 MED ORDER — IRBESARTAN 150 MG PO TABS
300.0000 mg | ORAL_TABLET | Freq: Every day | ORAL | Status: DC
  Administered 2021-11-30 – 2021-12-03 (×4): 300 mg via ORAL
  Filled 2021-11-29 (×4): qty 2

## 2021-11-29 MED ORDER — CARVEDILOL 6.25 MG PO TABS
6.2500 mg | ORAL_TABLET | Freq: Two times a day (BID) | ORAL | Status: DC
  Administered 2021-11-29 – 2021-12-03 (×9): 6.25 mg via ORAL
  Filled 2021-11-29 (×9): qty 1

## 2021-11-29 MED ORDER — PRAVASTATIN SODIUM 40 MG PO TABS
40.0000 mg | ORAL_TABLET | Freq: Every day | ORAL | Status: DC
  Administered 2021-11-29 – 2021-12-03 (×5): 40 mg via ORAL
  Filled 2021-11-29: qty 1
  Filled 2021-11-29: qty 2
  Filled 2021-11-29 (×2): qty 1
  Filled 2021-11-29: qty 2

## 2021-11-29 MED ORDER — HYDROCHLOROTHIAZIDE 12.5 MG PO TABS
12.5000 mg | ORAL_TABLET | Freq: Every day | ORAL | Status: DC
  Administered 2021-11-30 – 2021-12-03 (×4): 12.5 mg via ORAL
  Filled 2021-11-29 (×4): qty 1

## 2021-11-29 MED ORDER — CLOPIDOGREL BISULFATE 75 MG PO TABS
75.0000 mg | ORAL_TABLET | Freq: Every evening | ORAL | Status: DC
  Administered 2021-11-29 – 2021-12-03 (×5): 75 mg via ORAL
  Filled 2021-11-29 (×5): qty 1

## 2021-11-29 MED ORDER — ISOSORBIDE MONONITRATE ER 60 MG PO TB24
60.0000 mg | ORAL_TABLET | Freq: Every day | ORAL | Status: DC
  Administered 2021-11-30 – 2021-12-03 (×4): 60 mg via ORAL
  Filled 2021-11-29 (×4): qty 1

## 2021-11-29 NOTE — ED Notes (Signed)
Patient is being discharged from the Urgent Care and sent to the [facility] Emergency Department via EMS . Per Betha Loa, PA, patient is in need of higher level of care due to chest pain and SOB. Patient is aware and verbalizes understanding of plan of care.  Vitals:   11/29/21 1207  BP: (!) 200/82  Pulse: 75  Resp: 20  Temp: 97.8 F (36.6 C)  SpO2: 97%

## 2021-11-29 NOTE — ED Provider Notes (Signed)
[facility]  ____________________________________________  Time seen: Approximately 12:24 PM  I have reviewed the triage vital signs and the nursing notes.   HISTORY  Chief Complaint Chest Pain and Shortness of Breath    HPI Charles Blake is a 65 y.o. male who presents to the urgent care complaining of central substernal chest pain.  Patient states that he had some "discomfort" started last night before bed.  He thought it could be indigestion, went to bed without any medications.  Patient states that he woke up in the middle the night with worsening pain and was diaphoretic.  Again patient did not take any medications, went back to bed.  This morning he woke up and states that after getting moving the pain subsided somewhat, but now is increased.  He states that it is a heavy pressure-like pain in the center of his chest.  He has a significant cardiac history, has a known left bundle branch block.  Patient states that he saw his cardiologist 3 days ago with a reassuring appointment but he was not having any symptoms at that time.  He has taken nitro today without any relief of his symptoms.  There is no radiation into his neck, back or down his left arm.  Patient denies any URI symptoms.  No GI complaints.  History of anemia, COPD, CAD, GERD, hypertension, sleep apnea       Past Medical History:  Diagnosis Date   Anemia    Anxiety    Arthritis    fingers and hands   Cancer ([place])    skin cancer   COPD (chronic obstructive pulmonary disease) (HCC)    wheezing   Coronary artery disease    Dyspnea    GERD (gastroesophageal reflux disease)    Headache    2-3 times/ week   History of hiatal hernia    HOH (hard of hearing)    Hypercholesteremia    Hypertension    controlled   Pneumonia    Sleep apnea    CPAP   Wears dentures    upper    Patient Active Problem List   Diagnosis Date Noted   OSA on CPAP 08/05/2021   CPAP use counseling 08/05/2021   Obesity (BMI  30-39.9) 08/05/2021   COPD (chronic obstructive pulmonary disease) ([place]) 11/05/2020   Carotid stenosis, left 08/20/2020   Hyperlipidemia 07/06/2018   Hypertension 07/07/2017   Aortic atherosclerosis ([place]) 07/07/2017   Carotid artery stenosis 07/07/2017   PAD (peripheral artery disease) ([place]) 07/07/2017   Benign essential hypertension 06/14/2015   Coronary artery disease 02/06/2015   Complete left bundle branch block 01/19/2015   Psoriasis 01/10/2015   OSA (obstructive sleep apnea) 12/19/2014   Anxiety 07/06/2014   GERD (gastroesophageal reflux disease) 07/06/2014   Fracture of metatarsal of left foot, open 12/06/2013    Past Surgical History:  Procedure Laterality Date   APPENDECTOMY     CARDIAC CATHETERIZATION     CAROTID ENDARTERECTOMY Left    CAROTID PTA/STENT INTERVENTION Left 08/20/2020   Procedure: CAROTID PTA/STENT INTERVENTION;  Surgeon: Algernon Huxley, MD;  Location: [facility];  Service: Cardiovascular;  Laterality: Left;   CATARACT EXTRACTION Left    CATARACT EXTRACTION W/PHACO Right 05/24/2018   Procedure: CATARACT EXTRACTION PHACO AND INTRAOCULAR LENS PLACEMENT ([place]) RIGHT;  Surgeon: Eulogio Bear, MD;  Location: [facility];  Service: Ophthalmology;  Laterality: Right;  CPAP, sleep apnea   COLONOSCOPY N/A 05/29/2015   Procedure: COLONOSCOPY;  Surgeon: Billie Ruddy  Gustavo Lah, MD;  Location: [facility];  Service: Endoscopy;  Laterality: N/A;   iliac stents     TONSILLECTOMY      Prior to Admission medications   Medication Sig Start Date End Date Taking? Authorizing Provider  acetaminophen (TYLENOL) 650 MG CR tablet Take 650 mg by mouth every 8 (eight) hours as needed for pain.    [provider]  albuterol (PROVENTIL HFA;VENTOLIN HFA) 108 (90 Base) MCG/ACT inhaler Inhale 2 puffs into the lungs every 4 (four) hours as needed for wheezing. 02/02/17   Marylene Land, NP  amLODipine (NORVASC) 10 MG tablet Take 10 mg by mouth daily. am     [provider]  aspirin 81 MG tablet Take 81 mg by mouth daily. am Patient not taking: Reported on 07/16/2021    [provider]  atorvastatin (LIPITOR) 40 MG tablet Take 40 mg by mouth. am Patient not taking: Reported on 07/16/2021 07/01/17 07/01/22  [provider]  carvedilol (COREG) 3.125 MG tablet Take 3.125 mg by mouth 2 (two) times daily with a meal. Am and pm    [provider]  clonazePAM (KLONOPIN) 0.5 MG tablet Take 0.5 mg by mouth as needed.     [provider]  clopidogrel (PLAVIX) 75 MG tablet Take 1 tablet by mouth once daily 11/25/21   Algernon Huxley, MD  cyclobenzaprine (FLEXERIL) 10 MG tablet Take 1 tablet (10 mg total) by mouth 3 (three) times daily as needed. Patient not taking: Reported on 07/16/2021 01/16/18   Sable Feil, PA-C  Doxycycline Hyclate 200 MG TBEC  03/29/18   [provider]  escitalopram (LEXAPRO) 20 MG tablet Take 20 mg by mouth daily. Am    [provider]  fluconazole (DIFLUCAN) 200 MG tablet Take 200 mg by mouth once a week.  Patient not taking: Reported on 07/16/2021    [provider]  fluticasone (FLONASE) 50 MCG/ACT nasal spray Place into the nose.    [provider]  furosemide (LASIX) 20 MG tablet Take by mouth. 06/20/20 07/16/21  [provider]  hydrochlorothiazide (HYDRODIURIL) 25 MG tablet  04/10/18   [provider]  HYDROcodone-acetaminophen (NORCO/VICODIN) 5-325 MG tablet Take 1 tablet by mouth every 4 (four) hours as needed. Patient not taking: Reported on 07/16/2021 03/30/17   Harvest Dark, MD  isosorbide mononitrate (IMDUR) 60 MG 24 hr tablet Take by mouth. Patient not taking: Reported on 07/16/2021 06/29/20   [provider]  ketoconazole (NIZORAL) 2 % shampoo APPLY TOPICALLY DIRECTLY TO SCALP EVERY OTHER DAY ALLOW TO SIT FOR 30 60 MINUTES BEFORE RINSING 11/01/19   [provider]  ketorolac (TORADOL) 10 MG tablet Take 1 tablet (10 mg  total) by mouth every 6 (six) hours as needed. Patient not taking: Reported on 07/16/2021 01/16/18   Sable Feil, PA-C  losartan-hydrochlorothiazide (HYZAAR) 100-25 MG per tablet Take 1 tablet by mouth daily. am    [provider]  lovastatin (MEVACOR) 20 MG tablet Take 20 mg by mouth at bedtime.    [provider]  Melatonin 3 MG CAPS Take by mouth. pm    [provider]  Multiple Vitamin (MULTI-VITAMINS) TABS Take by mouth.    [provider]  nitroGLYCERIN (NITROSTAT) 0.4 MG SL tablet SMARTSIG:1 Tablet(s) Sublingual PRN 06/20/20   [provider]  Omega-3 Fatty Acids (FISH OIL) 1000 MG CAPS Take by mouth daily. am    [provider]  pantoprazole (PROTONIX) 40 MG tablet Take 40 mg by  mouth daily. am    [provider]  pravastatin (PRAVACHOL) 80 MG tablet Take 80 mg by mouth daily. Patient not taking: Reported on 07/16/2021    [provider]  tadalafil (CIALIS) 20 MG tablet Take 20 mg by mouth daily as needed for erectile dysfunction. Patient not taking: Reported on 07/16/2021    [provider]  telmisartan-hydrochlorothiazide (MICARDIS HCT) 80-12.5 MG tablet Take 1 tablet by mouth daily. 05/11/18   [provider]  tiotropium (SPIRIVA) 18 MCG inhalation capsule Place 18 mcg into inhaler and inhale daily. am    [provider]  traMADol (ULTRAM) 50 MG tablet Take 1 tablet (50 mg total) by mouth every 6 (six) hours as needed for moderate pain. Patient not taking: Reported on 07/16/2021 01/16/18   Sable Feil, PA-C    Allergies Rosuvastatin  Family History  Problem Relation Age of Onset   Heart attack Mother    Hypertension Mother    Kidney disease Mother    Heart attack Father    Hypertension Father    Colon cancer Father    Heart attack Brother     Social History Social History   Tobacco Use   Smoking status: Every Day    Packs/day: 0.50    Years: 40.00    Pack years: 20.00     Types: Cigarettes    Last attempt to quit: 11/09/2020    Years since quitting: 1.0   Smokeless tobacco: Former  Scientific laboratory technician Use: Never used  Substance Use Topics   Alcohol use: Yes    Comment: rarely   Drug use: Never     Review of Systems  Constitutional: No fever/chills Eyes: No visual changes. No discharge ENT: No upper respiratory complaints. Cardiovascular: Substernal chest pain Respiratory: no cough. No SOB. Gastrointestinal: No abdominal pain.  No nausea, no vomiting.  No diarrhea.  No constipation. Musculoskeletal: Negative for musculoskeletal pain. Skin: Negative for rash, abrasions, lacerations, ecchymosis. Neurological: Negative for headaches, focal weakness or numbness.  10 System ROS otherwise negative.  ____________________________________________   PHYSICAL EXAM:  VITAL SIGNS: ED Triage Vitals  Enc Vitals Group     BP 11/29/21 1207 (!) 200/82     Pulse Rate 11/29/21 1207 75     Resp 11/29/21 1207 20     Temp 11/29/21 1207 97.8 F (36.6 C)     Temp Source 11/29/21 1207 Oral     SpO2 11/29/21 1207 97 %     Weight 11/29/21 1206 231 lb 0.7 oz (104.8 kg)     Height 11/29/21 1206 5\' 7"  (1.702 m)     Head Circumference --      Peak Flow --      Pain Score 11/29/21 1205 8     Pain Loc --      Pain Edu? --      Excl. in [place]? --      Constitutional: Alert and oriented.  Patient is somewhat pale, slightly tachypneic, currently holding onto his chest. Eyes: Conjunctivae are normal. PERRL. EOMI. Head: Atraumatic. ENT:      Ears:       Nose: No congestion/rhinnorhea.      Mouth/Throat: Mucous membranes are moist.  Neck: No stridor.    Cardiovascular: Normal rate, regular rhythm. Normal S1 and S2.  No appreciable murmurs, rubs, gallops.  Good peripheral circulation. Respiratory: Normal respiratory effort without tachypnea or retractions. Lungs CTAB. Good air entry to the bases with no decreased or absent breath sounds.  Musculoskeletal: Full range of  motion to all extremities. No gross deformities appreciated. Neurologic:  Normal speech and language. No gross focal neurologic deficits are appreciated.  Skin:  Skin is warm, dry and intact. No rash noted. Psychiatric: Mood and affect are normal. Speech and behavior are normal. Patient exhibits appropriate insight and judgement.   ____________________________________________   LABS (all labs ordered are listed, but only abnormal results are displayed)  Labs Reviewed - No data to display ____________________________________________  EKG  ED ECG REPORT I, Charline Bills Matteson Blue,  personally viewed and interpreted this ECG.   Date: 11/29/2021  EKG Time: 1208 hrs.  Rate: 80 bpm  Rhythm: unchanged from previous tracings, normal sinus rhythm, LBBB, left axis deviation  Axis: Left axis deviation  Intervals:left bundle branch block  ST&T Change: No acute ST elevation or depression noted.   Left bundle branch block, left axis deviation.  Normal sinus rhythm.  Patient does not meet sgarbossa criteria.  Compared to previous EKG from 08/03/2020 no significant changes.  ____________________________________________  RADIOLOGY   No results found.  ____________________________________________    PROCEDURES  Procedure(s) performed:    Procedures    Medications - No data to display   ____________________________________________   INITIAL IMPRESSION / ASSESSMENT AND PLAN / ED COURSE  Pertinent labs & imaging results that were available during my care of the patient were reviewed by me and considered in my medical decision making (see chart for details).  Review of the [place] CSRS was performed in accordance of the [place] prior to dispensing any controlled drugs.           Patient's diagnosis is consistent with chest pain.  Patient presents to the urgent care complaining of substernal chest pain.  Significant cardiac history.  Patient states that he is taking his nitro with no  relief.  Patient has EKG performed here which reveals sinus rhythm with left bundle branch block.  Compared to previous EKG from 2021 no significant changes.  Patient does not meet sgarbossa criteria at this time for acute MI, however given the amount of chest pain patient is currently experiencing at 10 out of 10, unrelieved by nitro and his cardiac history patient needs to be evaluated in the emergency department.  Patient is here with a family member who cannot drive.  Patient states that given the amount of pain he does not feel safe driving to the emergency department and we will call EMS for the patient at this time..      ____________________________________________  FINAL CLINICAL IMPRESSION(S) / DIAGNOSES  Final diagnoses:  Chest pain, unspecified type      NEW MEDICATIONS STARTED DURING THIS VISIT:  ED Discharge Orders     None           This chart was dictated using voice recognition software/Dragon. Despite best efforts to proofread, errors can occur which can change the meaning. Any change was purely unintentional.    Darletta Moll, PA-C 11/29/21 1244

## 2021-11-29 NOTE — ED Triage Notes (Signed)
Pt in via EMS from [facility] with c/p CP since last pm. Pt with extensive cardiac hx including LBBB. Pt was given 1 spray of nitro and has 1 inch of paste on chest, HR 70's, 96% RA, hx of COPD, pt placed on 2L, 156/70

## 2021-11-29 NOTE — ED Triage Notes (Signed)
Pt comes into the ED via [facility] from home c/o central chest pain with SHOB.  Pt given 1 spray nitro and 1inch paste on the chest.  Pt does admit that he has a bronchial blockage and sees Louisa Second, MD.  Pt denies any nausea but admits to dizziness.

## 2021-11-29 NOTE — Progress Notes (Signed)
[facility] for heparin infusion Indication: chest pain/ACS  Allergies  Allergen Reactions   Rosuvastatin     Other reaction(s): Other (See Comments) Extreme joint pain & fatigue     Patient Measurements: Height: 5\' 7"  (170.2 cm) Weight: 104.8 kg (231 lb 0.7 oz) IBW/kg (Calculated) : 66.1 Heparin Dosing Weight: 89.3 kg  Vital Signs: Temp: 98.4 F (36.9 C) (12/23 1351) Temp Source: Oral (12/23 1351) BP: 139/77 (12/23 1629) Pulse Rate: 75 (12/23 1629)  Labs: Recent Labs    11/29/21 1350 11/29/21 1633  HGB 14.9  --   HCT 42.6  --   PLT 225  --   CREATININE 1.10  --   TROPONINIHS 222* 901*    Estimated Creatinine Clearance: 76.2 mL/min (by C-G formula based on SCr of 1.1 mg/dL).   Medical History: Past Medical History:  Diagnosis Date   Anemia    Anxiety    Arthritis    fingers and hands   Cancer ([facility]CC)    skin cancer   COPD (chronic obstructive pulmonary disease) (HCC)    wheezing   Coronary artery disease    Dyspnea    GERD (gastroesophageal reflux disease)    Headache    2-3 times/ week   History of hiatal hernia    HOH (hard of hearing)    Hypercholesteremia    Hypertension    controlled   Pneumonia    Sleep apnea    CPAP   Wears dentures    upper    Medications:  Per chart review, no anticoagulation noted prior to admission.   Assessment: 65 y.o. male who presents to the urgent care complaining of central substernal chest pain. Pharmacy has been consulted for heparin.   Goal of Therapy:  Heparin level 0.3-0.7 units/ml Monitor platelets by anticoagulation protocol: Yes   Plan:  Give 4000 units bolus x 1 Start heparin infusion at 1100 units/hr Check anti-Xa level in 6 hours and daily while on heparin Continue to monitor H&H and platelets  Delvis Kau O Kaj Vasil 11/29/2021,5:12 PM

## 2021-11-29 NOTE — ED Triage Notes (Signed)
Patient c/o chest pain and SOB that started last night.  Patient took one Nitroglycerin about 30 min ago.

## 2021-11-29 NOTE — H&P (Signed)
History and Physical    Charles Blake:[id number] DOB: 28-Apr-1956 DOA: 11/29/2021  PCP: Sofie Hartigan, MD  Patient coming from: home  I have personally briefly reviewed patient's old medical records in [place]  Chief Complaint: chest pain  HPI: Charles Blake is a 65 y.o. male with medical history significant of carotid stenosis s/p stent, PAD s/p stent, HTN, COPD, OSA on CPAP, known LBBB who presented with chest pain.   Pt woke up around 3 am with chest pain, but went back to sleep.  Pt had chicken noodle soup for breakfast, and CP worsened, so pt drove himself to urgent care clinic, who then called EMS to take him to the ED.  Pt described chest pain as across his upper chest, pressure-like, radiating to his left jaw, associated with dyspnea, and worse with walking around.  No fever, abdominal pain, N/V/D, dysuria, increased swelling.  Pt said he was at his baseline and doing well prior to the event.  Pt has had stent placement in his left carotid and for his PAD, and takes Plavix chronically.  Pt also follows with Dr. Nehemiah Massed for his LBBB, and had had a heart cath before but no stent placement.   ED Course: initial vitals: afebrile, pulse 75, BP 200/82, sating 97% on room air.  Basic labs unremarkable.  Trop 22 and then 901.  EKG showed LBBB.  CXR no acute finding.  Chest pain resolved with nitro paste.  Pt was started on heparin gtt.  ED provider contacted Dr. Clayborn Bigness who will see pt tomorrow.   Assessment/Plan Principal Problem:   NSTEMI (non-ST elevated myocardial infarction) ([place])  # NSTEMI --Trop 22 and then 901.  Chest pain resolved with nitro paste.   Plan: --cont heparin gtt --trend trop --Echo --Dr. Clayborn Bigness to see tomorrow --NPO MN for possible heart cath tomorrow  # HTN --BP elevated on presentation.   Plan: --cont home amlodipine, coreg, lasix, irbesartan and HCTZ, and Imdur  # Carotid stenosis s/p stent on the left # PAD s/p stent --cont  home Plavix  # COPD --stable. --cont home Anoro  # OSA on CPAP --cont CPAP nightly  # HLD --cont statin   DVT prophylaxis: RS:WNIOEVO gtt Code Status: Full code  Family Communication:   Disposition Plan: home  Consults called: cardiology Level of care: Progressive   Review of Systems: As per HPI otherwise complete review of systems negative.   Past Medical History:  Diagnosis Date   Anemia    Anxiety    Arthritis    fingers and hands   Cancer (HCC)    skin cancer   COPD (chronic obstructive pulmonary disease) (HCC)    wheezing   Coronary artery disease    Dyspnea    GERD (gastroesophageal reflux disease)    Headache    2-3 times/ week   History of hiatal hernia    HOH (hard of hearing)    Hypercholesteremia    Hypertension    controlled   Pneumonia    Sleep apnea    CPAP   Wears dentures    upper    Past Surgical History:  Procedure Laterality Date   APPENDECTOMY     CARDIAC CATHETERIZATION     CAROTID ENDARTERECTOMY Left    CAROTID PTA/STENT INTERVENTION Left 08/20/2020   Procedure: CAROTID PTA/STENT INTERVENTION;  Surgeon: Algernon Huxley, MD;  Location: [facility];  Service: Cardiovascular;  Laterality: Left;   CATARACT EXTRACTION Left    CATARACT  EXTRACTION W/PHACO Right 05/24/2018   Procedure: CATARACT EXTRACTION PHACO AND INTRAOCULAR LENS PLACEMENT (IOC) RIGHT;  Surgeon: Eulogio Bear, MD;  Location: [facility];  Service: Ophthalmology;  Laterality: Right;  CPAP, sleep apnea   COLONOSCOPY N/A 05/29/2015   Procedure: COLONOSCOPY;  Surgeon: Lollie Sails, MD;  Location: [facility];  Service: Endoscopy;  Laterality: N/A;   iliac stents     TONSILLECTOMY       reports that he has been smoking cigarettes. He has a 20.00 pack-year smoking history. He has quit using smokeless tobacco. He reports current alcohol use. He reports that he does not use drugs.  Allergies  Allergen Reactions   Rosuvastatin     Other reaction(s):  Other (See Comments) Extreme joint pain & fatigue     Family History  Problem Relation Age of Onset   Heart attack Mother    Hypertension Mother    Kidney disease Mother    Heart attack Father    Hypertension Father    Colon cancer Father    Heart attack Brother     Prior to Admission medications   Medication Sig Start Date End Date Taking? Authorizing Provider  acetaminophen (TYLENOL) 650 MG CR tablet Take 650 mg by mouth every 8 (eight) hours as needed for pain.   Yes [provider]  albuterol (PROVENTIL HFA;VENTOLIN HFA) 108 (90 Base) MCG/ACT inhaler Inhale 2 puffs into the lungs every 4 (four) hours as needed for wheezing. 02/02/17  Yes Marylene Land, NP  amLODipine (NORVASC) 5 MG tablet Take 5 mg by mouth daily.   Yes [provider]  carvedilol (COREG) 6.25 MG tablet Take 6.25 mg by mouth 2 (two) times daily with a meal.   Yes [provider]  cephALEXin (KEFLEX) 500 MG capsule Take 500 mg by mouth 2 (two) times daily.   Yes [provider]  clopidogrel (PLAVIX) 75 MG tablet Take 1 tablet by mouth once daily Patient taking differently: Take 75 mg by mouth every evening. 11/25/21  Yes Algernon Huxley, MD  escitalopram (LEXAPRO) 10 MG tablet Take 10 mg by mouth in the morning.   Yes [provider]  furosemide (LASIX) 20 MG tablet Take 20 mg by mouth daily.   Yes [provider]  isosorbide mononitrate (IMDUR) 60 MG 24 hr tablet Take 60 mg by mouth daily.   Yes [provider]  lovastatin (MEVACOR) 40 MG tablet Take 40 mg by mouth at bedtime.   Yes [provider]  Melatonin 3 MG CAPS Take 3 mg by mouth at bedtime.   Yes [provider]  Multiple Vitamin (MULTI-VITAMINS) TABS Take 1 tablet by mouth daily.   Yes [provider]  niacin (SLO-NIACIN) 500 MG tablet Take 500 mg by mouth at bedtime.   Yes [provider]  pantoprazole (PROTONIX) 40 MG tablet Take 40 mg by mouth daily.   Yes  [provider]  telmisartan-hydrochlorothiazide (MICARDIS HCT) 80-12.5 MG tablet Take 1 tablet by mouth daily. 05/11/18  Yes [provider]  umeclidinium-vilanterol (ANORO ELLIPTA) 62.5-25 MCG/ACT AEPB Inhale 1 puff into the lungs daily.   Yes [provider]    Physical Exam: Vitals:   11/29/21 1343 11/29/21 1351 11/29/21 1629 11/29/21 1730  BP:  (!) 142/62 139/77 (!) 164/85  Pulse:  75 75 71  Resp:  20 20 17   Temp:  98.4 F (36.9 C)    TempSrc:  Oral    SpO2:  91% 97% 97%  Weight: 104.8 kg     Height: 5\' 7"  (1.702 m)       Constitutional: NAD, AAOx3 HEENT: conjunctivae and lids normal, EOMI CV: No cyanosis.   RESP: normal respiratory effort, on RA Extremities: No effusions, edema in BLE SKIN: warm, dry Neuro: II - XII grossly intact.   Psych: Normal mood and affect.  Appropriate judgement and reason   Labs on Admission: I have personally reviewed following labs and imaging studies  CBC: Recent Labs  Lab 11/29/21 1350  WBC 10.3  HGB 14.9  HCT 42.6  MCV 93.0  PLT 631   Basic Metabolic Panel: Recent Labs  Lab 11/29/21 1350  NA 137  K 3.9  CL 106  CO2 26  GLUCOSE 117*  BUN 15  CREATININE 1.10  CALCIUM 8.9   GFR: Estimated Creatinine Clearance: 76.2 mL/min (by C-G formula based on SCr of 1.1 mg/dL). Liver Function Tests: No results for input(s): AST, ALT, ALKPHOS, BILITOT, PROT, ALBUMIN in the last 168 hours. No results for input(s): LIPASE, AMYLASE in the last 168 hours. No results for input(s): AMMONIA in the last 168 hours. Coagulation Profile: Recent Labs  Lab 11/29/21 1802  INR 1.0   Cardiac Enzymes: No results for input(s): CKTOTAL, CKMB, CKMBINDEX, TROPONINI in the last 168 hours. BNP (last 3 results) No results for input(s): PROBNP in the last 8760 hours. HbA1C: No results for input(s): HGBA1C in the last 72 hours. CBG: No results for input(s): GLUCAP in the last 168 hours. Lipid Profile: No results for  input(s): CHOL, HDL, LDLCALC, TRIG, CHOLHDL, LDLDIRECT in the last 72 hours. Thyroid Function Tests: No results for input(s): TSH, T4TOTAL, FREET4, T3FREE, THYROIDAB in the last 72 hours. Anemia Panel: No results for input(s): VITAMINB12, FOLATE, FERRITIN, TIBC, IRON, RETICCTPCT in the last 72 hours. Urine analysis:    Component Value Date/Time   COLORURINE Yellow 02/22/2014 1102   APPEARANCEUR Clear 02/22/2014 1102   LABSPEC 1.011 02/22/2014 1102   PHURINE 8.0 02/22/2014 1102   GLUCOSEU Negative 02/22/2014 1102   HGBUR Negative 02/22/2014 1102   BILIRUBINUR Negative 02/22/2014 1102   KETONESUR Negative 02/22/2014 1102   PROTEINUR Negative 02/22/2014 1102   NITRITE Negative 02/22/2014 1102   LEUKOCYTESUR Negative 02/22/2014 1102    Radiological Exams on Admission: DG Chest 2 View  Result Date: 11/29/2021 CLINICAL DATA:  Chest pain.  COPD. EXAM: CHEST - 2 VIEW COMPARISON:  08/03/2020 FINDINGS: The heart size and mediastinal contours are within normal limits. Low lung volumes are noted. Both lungs are clear. No evidence of pleural effusion. The visualized skeletal structures are unremarkable. IMPRESSION: Low lung volumes. No active cardiopulmonary disease. Electronically Signed   By: Marlaine Hind M.D.   On: 11/29/2021 14:19      Enzo Bi MD [facility]  If 7PM-7AM, please contact night-coverage 11/29/2021, 7:34 PM

## 2021-11-29 NOTE — ED Provider Notes (Signed)
Emergency Medicine Provider Triage Evaluation Note  Charles Blake , a 65 y.o. male  was evaluated in triage.  Pt complains of chest pain that started this morning.  History of cardiac disease and stents.  Has stent in the neck and groin area.  Patient is followed by Dr. Nehemiah Massed.  Was seen in urgent care.  EMS transported him to the emergency department.  They gave him 1 nitro spray and 1 inch Nitropaste which did relieve the pain.  Unsure if they gave him aspirin.  They did not give aspirin at urgent care..  Review of Systems  Positive: Chest pain Negative: Vomiting/diarrhea  Physical Exam  BP (!) 142/62 (BP Location: Left Arm)    Pulse 75    Temp 98.4 F (36.9 C) (Oral)    Resp 20    Ht 5\' 7"  (1.702 m)    Wt 104.8 kg    SpO2 91%    BMI 36.19 kg/m  Gen:   Awake, no distress   Resp:  Normal effort  MSK:   Moves extremities without difficulty  Other:    Medical Decision Making  Medically screening exam initiated at 1:58 PM.  Appropriate orders placed.  Almon Hercules was informed that the remainder of the evaluation will be completed by another provider, this initial triage assessment does not replace that evaluation, and the importance of remaining in the ED until their evaluation is complete.     Versie Starks, PA-C 11/29/21 1359    Vanessa Herndon, MD 11/29/21 [phone]

## 2021-11-29 NOTE — ED Provider Notes (Signed)
[facility] Emergency Department Provider Note   ____________________________________________   Event Date/Time   First MD Initiated Contact with Patient 11/29/21 1654     (approximate)  I have reviewed the triage vital signs and the nursing notes.   HISTORY  Chief Complaint Chest Pain and Shortness of Breath    HPI Charles Blake is a 65 y.o. male with a past medical history of left bundle branch block currently followed by cardiology who presents for chest pain and shortness of breath  LOCATION: Substernal chest DURATION: Began at 3 AM today TIMING: Improved since onset SEVERITY: Severe QUALITY: Pressure CONTEXT: Patient states that he was awoken at 3 AM with substernal chest pressure that continually worsened throughout the day until the placement of a nitroglycerin patch and has almost fully resolved at this time.   MODIFYING FACTORS: Patient denies any exertional change or change after he had coffee this morning and denies any relieving factors ASSOCIATED SYMPTOMS: Shortness of breath   Per medical record review, patient has history of COPD, left bundle branch block, GERD          Past Medical History:  Diagnosis Date   Anemia    Anxiety    Arthritis    fingers and hands   Cancer ([place])    skin cancer   COPD (chronic obstructive pulmonary disease) (HCC)    wheezing   Coronary artery disease    Dyspnea    GERD (gastroesophageal reflux disease)    Headache    2-3 times/ week   History of hiatal hernia    HOH (hard of hearing)    Hypercholesteremia    Hypertension    controlled   Pneumonia    Sleep apnea    CPAP   Wears dentures    upper    Patient Active Problem List   Diagnosis Date Noted   OSA on CPAP 08/05/2021   CPAP use counseling 08/05/2021   Obesity (BMI 30-39.9) 08/05/2021   COPD (chronic obstructive pulmonary disease) ([place]) 11/05/2020   Carotid stenosis, left 08/20/2020   Hyperlipidemia 07/06/2018    Hypertension 07/07/2017   Aortic atherosclerosis ([place]) 07/07/2017   Carotid artery stenosis 07/07/2017   PAD (peripheral artery disease) ([place]) 07/07/2017   Benign essential hypertension 06/14/2015   Coronary artery disease 02/06/2015   Complete left bundle branch block 01/19/2015   Psoriasis 01/10/2015   OSA (obstructive sleep apnea) 12/19/2014   Anxiety 07/06/2014   GERD (gastroesophageal reflux disease) 07/06/2014   Fracture of metatarsal of left foot, open 12/06/2013    Past Surgical History:  Procedure Laterality Date   APPENDECTOMY     CARDIAC CATHETERIZATION     CAROTID ENDARTERECTOMY Left    CAROTID PTA/STENT INTERVENTION Left 08/20/2020   Procedure: CAROTID PTA/STENT INTERVENTION;  Surgeon: Algernon Huxley, MD;  Location: [facility];  Service: Cardiovascular;  Laterality: Left;   CATARACT EXTRACTION Left    CATARACT EXTRACTION W/PHACO Right 05/24/2018   Procedure: CATARACT EXTRACTION PHACO AND INTRAOCULAR LENS PLACEMENT ([place]) RIGHT;  Surgeon: Eulogio Bear, MD;  Location: [facility];  Service: Ophthalmology;  Laterality: Right;  CPAP, sleep apnea   COLONOSCOPY N/A 05/29/2015   Procedure: COLONOSCOPY;  Surgeon: Lollie Sails, MD;  Location: [facility] ENDOSCOPY;  Service: Endoscopy;  Laterality: N/A;   iliac stents     TONSILLECTOMY      Prior to Admission medications   Medication Sig Start Date End Date Taking? Authorizing Provider  albuterol (PROVENTIL HFA;VENTOLIN HFA) 108 (  90 Base) MCG/ACT inhaler Inhale 2 puffs into the lungs every 4 (four) hours as needed for wheezing. 02/02/17  Yes Marylene Land, NP  amLODipine (NORVASC) 5 MG tablet Take 5 mg by mouth daily.   Yes [provider]  cephALEXin (KEFLEX) 500 MG capsule Take 500 mg by mouth 2 (two) times daily.   Yes [provider]  clopidogrel (PLAVIX) 75 MG tablet Take 1 tablet by mouth once daily 11/25/21  Yes Dew, Erskine Squibb, MD  escitalopram (LEXAPRO) 10 MG tablet Take 10 mg by mouth  in the morning.   Yes [provider]  lovastatin (MEVACOR) 40 MG tablet Take 40 mg by mouth at bedtime.   Yes [provider]  pantoprazole (PROTONIX) 40 MG tablet Take 40 mg by mouth daily.   Yes [provider]  telmisartan-hydrochlorothiazide (MICARDIS HCT) 80-12.5 MG tablet Take 1 tablet by mouth daily. 05/11/18  Yes [provider]  tiotropium (SPIRIVA) 18 MCG inhalation capsule Place 18 mcg into inhaler and inhale in the morning.   Yes [provider]  acetaminophen (TYLENOL) 650 MG CR tablet Take 650 mg by mouth every 8 (eight) hours as needed for pain.    [provider]  aspirin 81 MG tablet Take 81 mg by mouth daily. am Patient not taking: Reported on 07/16/2021    [provider]  carvedilol (COREG) 6.25 MG tablet Take 6.25 mg by mouth 2 (two) times daily with a meal.    [provider]  Melatonin 3 MG CAPS Take by mouth. pm    [provider]  Multiple Vitamin (MULTI-VITAMINS) TABS Take by mouth.    [provider]  nitroGLYCERIN (NITROSTAT) 0.4 MG SL tablet SMARTSIG:1 Tablet(s) Sublingual PRN 06/20/20   [provider]  Omega-3 Fatty Acids (FISH OIL) 1000 MG CAPS Take by mouth daily. am    [provider]    Allergies Rosuvastatin  Family History  Problem Relation Age of Onset   Heart attack Mother    Hypertension Mother    Kidney disease Mother    Heart attack Father    Hypertension Father    Colon cancer Father    Heart attack Brother     Social History Social History   Tobacco Use   Smoking status: Every Day    Packs/day: 0.50    Years: 40.00    Pack years: 20.00    Types: Cigarettes    Last attempt to quit: 11/09/2020    Years since quitting: 1.0   Smokeless tobacco: Former  Scientific laboratory technician Use: Never used  Substance Use Topics   Alcohol use: Yes    Comment: rarely   Drug use: Never    Review of Systems Constitutional: No fever/chills Eyes: No  visual changes. ENT: No sore throat. Cardiovascular: Endorses chest pain. Respiratory: Endorses shortness of breath. Gastrointestinal: No abdominal pain.  No nausea, no vomiting.  No diarrhea. Genitourinary: Negative for dysuria. Musculoskeletal: Negative for acute arthralgias Skin: Negative for rash. Neurological: Negative for headaches, weakness/numbness/paresthesias in any extremity Psychiatric: Negative for suicidal ideation/homicidal ideation   ____________________________________________   PHYSICAL EXAM:  VITAL SIGNS: ED Triage Vitals  Enc Vitals Group     BP 11/29/21 1351 (!) 142/62     Pulse Rate 11/29/21 1351 75     Resp 11/29/21 1351 20     Temp 11/29/21 1351 98.4 F (36.9 C)     Temp Source 11/29/21 1351 Oral     SpO2 11/29/21 1351 91 %  Weight 11/29/21 1343 231 lb 0.7 oz (104.8 kg)     Height 11/29/21 1343 5\' 7"  (1.702 m)     Head Circumference --      Peak Flow --      Pain Score 11/29/21 1343 6     Pain Loc --      Pain Edu? --      Excl. in [place]? --    Constitutional: Alert and oriented. Well appearing obese middle-aged Caucasian male in no acute distress. Eyes: Conjunctivae are normal. PERRL. Head: Atraumatic. Nose: No congestion/rhinnorhea. Mouth/Throat: Mucous membranes are moist. Neck: No stridor Cardiovascular: Grossly normal heart sounds.  Good peripheral circulation. Respiratory: Normal respiratory effort.  No retractions. Gastrointestinal: Soft and nontender. No distention. Musculoskeletal: No obvious deformities Neurologic:  Normal speech and language. No gross focal neurologic deficits are appreciated. Skin:  Skin is warm and dry. No rash noted. Psychiatric: Mood and affect are normal. Speech and behavior are normal.  ____________________________________________   LABS (all labs ordered are listed, but only abnormal results are displayed)  Labs Reviewed  BASIC METABOLIC PANEL - Abnormal; Notable for the following components:       Result Value   Glucose, Bld 117 (*)    All other components within normal limits  TROPONIN I (HIGH SENSITIVITY) - Abnormal; Notable for the following components:   Troponin I (High Sensitivity) 222 (*)    All other components within normal limits  TROPONIN I (HIGH SENSITIVITY) - Abnormal; Notable for the following components:   Troponin I (High Sensitivity) 901 (*)    All other components within normal limits  RESP PANEL BY RT-PCR (FLU A&B, COVID) ARPGX2  CBC  APTT  PROTIME-INR  CBC  HEPARIN LEVEL (UNFRACTIONATED)   ____________________________________________  EKG  ED ECG REPORT I, Naaman Plummer, the attending physician, personally viewed and interpreted this ECG.  Date: 11/29/2021 EKG Time: 1347 Rate: 81 Rhythm: normal sinus rhythm QRS Axis: normal Intervals: LBBB ST/T Wave abnormalities: normal Narrative Interpretation: LBBB. no evidence of acute ischemia  ____________________________________________  RADIOLOGY  ED MD interpretation: 2 view chest x-ray shows no evidence of acute abnormalities including no pneumonia, pneumothorax, or widened mediastinum  Official radiology report(s): DG Chest 2 View  Result Date: 11/29/2021 CLINICAL DATA:  Chest pain.  COPD. EXAM: CHEST - 2 VIEW COMPARISON:  08/03/2020 FINDINGS: The heart size and mediastinal contours are within normal limits. Low lung volumes are noted. Both lungs are clear. No evidence of pleural effusion. The visualized skeletal structures are unremarkable. IMPRESSION: Low lung volumes. No active cardiopulmonary disease. Electronically Signed   By: Marlaine Hind M.D.   On: 11/29/2021 14:19    ____________________________________________   PROCEDURES  Procedure(s) performed (including Critical Care):  .1-3 Lead EKG Interpretation Performed by: Naaman Plummer, MD Authorized by: Naaman Plummer, MD     Interpretation: abnormal     ECG rate:  74   ECG rate assessment: normal     Rhythm: sinus rhythm      Ectopy: none     Conduction: normal   Comments:     LBBB  CRITICAL CARE Performed by: Naaman Plummer   Total critical care time: 21 minutes  Critical care time was exclusive of separately billable procedures and treating other patients.  Critical care was necessary to treat or prevent imminent or life-threatening deterioration.  Critical care was time spent personally by me on the following activities: development of treatment plan with patient and/or surrogate as well as nursing, discussions with  consultants, evaluation of patient's response to treatment, examination of patient, obtaining history from patient or surrogate, ordering and performing treatments and interventions, ordering and review of laboratory studies, ordering and review of radiographic studies, pulse oximetry and re-evaluation of patient's condition.  ____________________________________________   INITIAL IMPRESSION / ASSESSMENT AND PLAN / ED COURSE  As part of my medical decision making, I reviewed the following data within the electronic medical record, if available:  Nursing notes reviewed and incorporated, Labs reviewed, EKG interpreted, Old chart reviewed, Radiograph reviewed and Notes from prior ED visits reviewed and incorporated        Workup: ECG, CXR, CBC, CMP, Troponin Findings: ECG: No overt evidence of STEMI. No evidence of Brugadas sign, delta wave, epsilon wave, significantly prolonged QTc, or malignant arrhythmia Troponin: 222-->901 Other Labs unremarkable for emergent problems. CXR: Without PTX, PNA, or widened mediastinum Last Stress Test:  2020 Last Heart Catheterization:  2020 HEART Score: 6  Given History, Exam, and Workup concern for NSTEMI.  I have low suspicion for Pneumothorax, Pneumonia, Pulmonary Embolus, Tamponade, Aortic Dissection  Interventions: ASA 324or325mg  Heparin Bolus 60-70u/kg (max 5000) Heparin gtt about 12-15u/kg/hr (max 1000/hr) PRN analgesia with fentanyl,  morphine PRN antiemetic therapy  Dispo: Admit      ____________________________________________   FINAL CLINICAL IMPRESSION(S) / ED DIAGNOSES  Final diagnoses:  Unstable angina pectoris (HCC)  NSTEMI (non-ST elevated myocardial infarction) [facility])     ED Discharge Orders     None        Note:  This document was prepared using Dragon voice recognition software and may include unintentional dictation errors.    Naaman Plummer, MD 11/29/21 [phone]

## 2021-11-30 ENCOUNTER — Inpatient Hospital Stay
Admit: 2021-11-30 | Discharge: 2021-11-30 | Disposition: A | Discharge status: Home / Self Care | Payer: Medicare Other | Attending: Hospitalist | Admitting: Hospitalist

## 2021-11-30 DIAGNOSIS — I214 Non-ST elevation (NSTEMI) myocardial infarction: Secondary | ICD-10-CM | POA: Diagnosis not present

## 2021-11-30 LAB — ECHOCARDIOGRAM COMPLETE
AR max vel: 2.03 cm2
AV Peak grad: 6.4 mmHg
Ao pk vel: 1.26 m/s
Area-P 1/2: 3.12 cm2
Height: 67 in
S' Lateral: 3.6 cm
Weight: 3696.67 oz

## 2021-11-30 LAB — BASIC METABOLIC PANEL
Anion gap: 4 — ABNORMAL LOW (ref 5–15)
BUN: 15 mg/dL (ref 8–23)
CO2: 27 mmol/L (ref 22–32)
Calcium: 8.8 mg/dL — ABNORMAL LOW (ref 8.9–10.3)
Chloride: 104 mmol/L (ref 98–111)
Creatinine, Ser: 1.01 mg/dL (ref 0.61–1.24)
GFR, Estimated: >60 mL/min (ref >60)
Glucose, Bld: 123 mg/dL — ABNORMAL HIGH (ref 70–99)
Potassium: 3.9 mmol/L (ref 3.5–5.1)
Sodium: 135 mmol/L (ref 135–145)

## 2021-11-30 LAB — CBC
HCT: 40.6 % (ref 39.0–52.0)
Hemoglobin: 14.1 g/dL (ref 13.0–17.0)
MCH: 32.5 pg (ref 26.0–34.0)
MCHC: 34.7 g/dL (ref 30.0–36.0)
MCV: 93.5 fL (ref 80.0–100.0)
Platelets: 210 K/uL (ref 150–400)
RBC: 4.34 MIL/uL (ref 4.22–5.81)
RDW: 12.2 % (ref 11.5–15.5)
WBC: 10.1 K/uL (ref 4.0–10.5)
nRBC: 0 % (ref 0.0–0.2)

## 2021-11-30 LAB — HEPARIN LEVEL (UNFRACTIONATED)
Heparin Unfractionated: 0.39 IU/mL (ref 0.30–0.70)
Heparin Unfractionated: <0.1 IU/mL — ABNORMAL LOW (ref 0.30–0.70)
Heparin Unfractionated: 0.33 IU/mL (ref 0.30–0.70)

## 2021-11-30 LAB — MAGNESIUM: Magnesium: 2.1 mg/dL (ref 1.7–2.4)

## 2021-11-30 LAB — TROPONIN I (HIGH SENSITIVITY)
Troponin I (High Sensitivity): 1510 ng/L
Troponin I (High Sensitivity): 1130 ng/L (ref <18)
Troponin I (High Sensitivity): 944 ng/L (ref <18)

## 2021-11-30 LAB — HIV ANTIBODY (ROUTINE TESTING W REFLEX): HIV Screen 4th Generation wRfx: NONREACTIVE

## 2021-11-30 MED ORDER — SODIUM CHLORIDE 0.9% FLUSH
3.0000 mL | Freq: Two times a day (BID) | INTRAVENOUS | Status: DC
  Administered 2021-11-30 – 2021-12-03 (×5): 3 mL via INTRAVENOUS

## 2021-11-30 MED ORDER — HEPARIN BOLUS VIA INFUSION
2600.0000 [IU] | Freq: Once | INTRAVENOUS | Status: AC
  Administered 2021-11-30: 09:00:00 2600 [IU] via INTRAVENOUS
  Filled 2021-11-30: qty 2600

## 2021-11-30 MED ORDER — PERFLUTREN LIPID MICROSPHERE
1.0000 mL | INTRAVENOUS | Status: AC | PRN
Start: 2021-11-30 — End: 2021-11-30
  Administered 2021-11-30: 12:00:00 5 mL via INTRAVENOUS
  Filled 2021-11-30: qty 10

## 2021-11-30 NOTE — ED Notes (Signed)
Dietary called at this time to get meal tray.

## 2021-11-30 NOTE — Progress Notes (Signed)
[place] for heparin infusion Indication: chest pain/ACS  Allergies  Allergen Reactions   Rosuvastatin     Other reaction(s): Other (See Comments) Extreme joint pain & fatigue     Patient Measurements: Height: 5\' 7"  (170.2 cm) Weight: 104.8 kg (231 lb 0.7 oz) IBW/kg (Calculated) : 66.1 Heparin Dosing Weight: 89.3 kg  Vital Signs: BP: 120/54 (12/24 1713) Pulse Rate: 71 (12/24 1713)  Labs: Recent Labs    11/29/21 1350 11/29/21 1633 11/29/21 1802 11/29/21 2234 11/30/21 0103 11/30/21 0310 11/30/21 0454 11/30/21 0735 11/30/21 1644  HGB 14.9  --   --   --   --   --  14.1  --   --   HCT 42.6  --   --   --   --   --  40.6  --   --   PLT 225  --   --   --   --   --  210  --   --   APTT  --   --  28  --   --   --   --   --   --   LABPROT  --   --  13.1  --   --   --   --   --   --   INR  --   --  1.0  --   --   --   --   --   --   HEPARINUNFRC  --   --   --   --  0.39  --   --  <0.10* 0.33  CREATININE 1.10  --   --   --   --   --  1.01  --   --   TROPONINIHS 222*   < >  --    < > 1,510* 1,130* 944*  --   --    < > = values in this interval not displayed.     Estimated Creatinine Clearance: 83 mL/min (by C-G formula based on SCr of 1.01 mg/dL).   Medical History: Past Medical History:  Diagnosis Date   Anemia    Anxiety    Arthritis    fingers and hands   Cancer (HCC)    skin cancer   COPD (chronic obstructive pulmonary disease) (HCC)    wheezing   Coronary artery disease    Dyspnea    GERD (gastroesophageal reflux disease)    Headache    2-3 times/ week   History of hiatal hernia    HOH (hard of hearing)    Hypercholesteremia    Hypertension    controlled   Pneumonia    Sleep apnea    CPAP   Wears dentures    upper    Medications:  Per chart review, no anticoagulation noted prior to admission.   Assessment: 65 y.o. male who presents to the urgent care complaining of central substernal chest pain. Pharmacy  has been consulted for heparin.   Date Time HL Rate/comment 12/24 0103 0.39 1100 un/hr, therapeutic 12/24  0735  <0.10 1100 un/hr, subtherapeutic 12/24 1644 0.33 1350 un/hr, therapeutic  Goal of Therapy:  Heparin level 0.3-0.7 units/ml Monitor platelets by anticoagulation protocol: Yes   Plan:  Heparin level 0.33, therapeutic Continue heparin infusion at 1350 units/hr Check confirmatory HL in 6 hours Monitor daily CBC, s/s of bleed   Darnelle Bos, PharmD  11/30/2021,5:16 PM

## 2021-11-30 NOTE — ED Notes (Signed)
Echo at bedside

## 2021-11-30 NOTE — Progress Notes (Signed)
PROGRESS NOTE    Charles Blake  PXT:[id number] DOB: 1956-06-30 DOA: 11/29/2021 PCP: Sofie Hartigan, MD  [id number]/[id number]   Assessment & Plan:   Principal Problem:   NSTEMI (non-ST elevated myocardial infarction) [facility])   Charles Blake is a 65 y.o. male with medical history significant of carotid stenosis s/p stent, PAD s/p stent, HTN, COPD, OSA on CPAP, known LBBB who presented with chest pain.    # NSTEMI --Trop 22 and peaked at 1500.  Chest pain resolved with nitro paste.   --started on heparin gtt Plan: --cont heparin gtt --cardiology consult today --heart cath per cardio   # HTN --BP elevated on presentation.   --on home amlodipine, coreg, lasix, irbesartan and HCTZ, and Imdur --cont home regimen   # Carotid stenosis s/p stent on the left # PAD s/p stent --cont home plavix   # COPD --stable. --cont home Anoro   # OSA on CPAP --cont CPAP nightly   # HLD --cont statin  # GERD --cont PPI   DVT prophylaxis: OE:VOJJKKX gtt Code Status: Full code  Family Communication:  Level of care: Progressive Dispo:   The patient is from: home Anticipated d/c is to: home Anticipated d/c date is: 2-3 days Patient currently is not medically ready to d/c due to: pending heart cath   Subjective and Interval History:  No further CP.     Objective: Vitals:   11/30/21 1830 11/30/21 1930 11/30/21 2145 12/01/21 0035  BP: (!) 154/75 (!) 141/79 (!) 148/76 (!) 147/63  Pulse: 68 85 71 61  Resp: 18 19 16    Temp:      TempSrc:      SpO2: 93% 96% 97% 92%  Weight:      Height:        Intake/Output Summary (Last 24 hours) at 12/01/2021 0123 Last data filed at 11/30/2021 1717 Gross per 24 hour  Intake 250 ml  Output --  Net 250 ml   Filed Weights   11/29/21 1343  Weight: 104.8 kg    Examination:   Constitutional: NAD, AAOx3 HEENT: conjunctivae and lids normal, EOMI CV: No cyanosis.   RESP: normal respiratory effort, on RA Extremities: No effusions,  edema in BLE SKIN: warm, dry Neuro: II - XII grossly intact.   Psych: Normal mood and affect.  Appropriate judgement and reason   Data Reviewed: I have personally reviewed following labs and imaging studies  CBC: Recent Labs  Lab 11/29/21 1350 11/30/21 0454  WBC 10.3 10.1  HGB 14.9 14.1  HCT 42.6 40.6  MCV 93.0 93.5  PLT 225 381   Basic Metabolic Panel: Recent Labs  Lab 11/29/21 1350 11/30/21 0454  NA 137 135  K 3.9 3.9  CL 106 104  CO2 26 27  GLUCOSE 117* 123*  BUN 15 15  CREATININE 1.10 1.01  CALCIUM 8.9 8.8*  MG  --  2.1   GFR: Estimated Creatinine Clearance: 83 mL/min (by C-G formula based on SCr of 1.01 mg/dL). Liver Function Tests: No results for input(s): AST, ALT, ALKPHOS, BILITOT, PROT, ALBUMIN in the last 168 hours. No results for input(s): LIPASE, AMYLASE in the last 168 hours. No results for input(s): AMMONIA in the last 168 hours. Coagulation Profile: Recent Labs  Lab 11/29/21 1802  INR 1.0   Cardiac Enzymes: No results for input(s): CKTOTAL, CKMB, CKMBINDEX, TROPONINI in the last 168 hours. BNP (last 3 results) No results for input(s): PROBNP in the last 8760 hours. HbA1C: No results for input(s): HGBA1C in  the last 72 hours. CBG: No results for input(s): GLUCAP in the last 168 hours. Lipid Profile: No results for input(s): CHOL, HDL, LDLCALC, TRIG, CHOLHDL, LDLDIRECT in the last 72 hours. Thyroid Function Tests: No results for input(s): TSH, T4TOTAL, FREET4, T3FREE, THYROIDAB in the last 72 hours. Anemia Panel: No results for input(s): VITAMINB12, FOLATE, FERRITIN, TIBC, IRON, RETICCTPCT in the last 72 hours. Sepsis Labs: No results for input(s): PROCALCITON, LATICACIDVEN in the last 168 hours.  Recent Results (from the past 240 hour(s))  Resp Panel by RT-PCR (Flu A&B, Covid) Nasopharyngeal Swab     Status: None   Collection Time: 11/29/21  5:16 PM   Specimen: Nasopharyngeal Swab; Nasopharyngeal(NP) swabs in vial transport medium   Result Value Ref Range Status   SARS Coronavirus 2 by RT PCR NEGATIVE NEGATIVE Final    Comment: (NOTE) SARS-CoV-2 target nucleic acids are NOT DETECTED.  The SARS-CoV-2 RNA is generally detectable in upper respiratory specimens during the acute phase of infection. The lowest concentration of SARS-CoV-2 viral copies this assay can detect is 138 copies/mL. A negative result does not preclude SARS-Cov-2 infection and should not be used as the sole basis for treatment or other patient management decisions. A negative result may occur with  improper specimen collection/handling, submission of specimen other than nasopharyngeal swab, presence of viral mutation(s) within the areas targeted by this assay, and inadequate number of viral copies(<138 copies/mL). A negative result must be combined with clinical observations, patient history, and epidemiological information. The expected result is Negative.  Fact Sheet for Patients:  EntrepreneurPulse.com.au  Fact Sheet for Healthcare Providers:  IncredibleEmployment.be  This test is no t yet approved or cleared by the [place] FDA and  has been authorized for detection and/or diagnosis of SARS-CoV-2 by FDA under an Emergency Use Authorization (EUA). This EUA will remain  in effect (meaning this test can be used) for the duration of the COVID-19 declaration under Section 564(b)(1) of the Act, 21 U.S.C.section 360bbb-3(b)(1), unless the authorization is terminated  or revoked sooner.       Influenza A by PCR NEGATIVE NEGATIVE Final   Influenza B by PCR NEGATIVE NEGATIVE Final    Comment: (NOTE) The Xpert Xpress SARS-CoV-2/FLU/RSV plus assay is intended as an aid in the diagnosis of influenza from Nasopharyngeal swab specimens and should not be used as a sole basis for treatment. Nasal washings and aspirates are unacceptable for Xpert Xpress SARS-CoV-2/FLU/RSV testing.  Fact Sheet for  Patients: EntrepreneurPulse.com.au  Fact Sheet for Healthcare Providers: IncredibleEmployment.be  This test is not yet approved or cleared by the [place] FDA and has been authorized for detection and/or diagnosis of SARS-CoV-2 by FDA under an Emergency Use Authorization (EUA). This EUA will remain in effect (meaning this test can be used) for the duration of the COVID-19 declaration under Section 564(b)(1) of the Act, 21 U.S.C. section 360bbb-3(b)(1), unless the authorization is terminated or revoked.  Performed at [facility], [address] [place], [place] [address]       Radiology Studies: DG Chest 2 View  Result Date: 11/29/2021 CLINICAL DATA:  Chest pain.  COPD. EXAM: CHEST - 2 VIEW COMPARISON:  08/03/2020 FINDINGS: The heart size and mediastinal contours are within normal limits. Low lung volumes are noted. Both lungs are clear. No evidence of pleural effusion. The visualized skeletal structures are unremarkable. IMPRESSION: Low lung volumes. No active cardiopulmonary disease. Electronically Signed   By: Marlaine Hind M.D.   On: 11/29/2021 14:19   ECHOCARDIOGRAM COMPLETE  Result Date: 11/30/2021    ECHOCARDIOGRAM REPORT   Patient Name:   ZALEN SEQUEIRA Date of Exam: 11/30/2021 Medical Rec #:  [id number]         Height:       67.0 in Accession #:    [id number]        Weight:       231.0 lb Date of Birth:  [birth year]          BSA:          2.150 m Patient Age:    71 years          BP:           130/58 mmHg Patient Gender: M                 HR:           68 bpm. Exam Location:  [facility] Procedure: 2D Echo and Intracardiac Opacification Agent Indications:     NSTEMI I21.4  History:         Patient has no prior history of Echocardiogram examinations.  Sonographer:     Kathlen Brunswick RDCS Referring Phys:  [id number] [place] Diagnosing Phys: Yolonda Kida MD  Sonographer Comments: Technically difficult study due to poor echo windows.  Image acquisition challenging due to patient body habitus. IMPRESSIONS  1. Left ventricular ejection fraction, by estimation, is 50 to 55%. The left ventricle has low normal function. The left ventricle has no regional wall motion abnormalities. The left ventricular internal cavity size was mildly dilated. Left ventricular diastolic parameters are consistent with Grade I diastolic dysfunction (impaired relaxation).  2. Right ventricular systolic function is normal. The right ventricular size is normal.  3. The mitral valve is normal in structure. No evidence of mitral valve regurgitation.  4. The aortic valve is grossly normal. Aortic valve regurgitation is not visualized. FINDINGS  Left Ventricle: Left ventricular ejection fraction, by estimation, is 50 to 55%. The left ventricle has low normal function. The left ventricle has no regional wall motion abnormalities. Definity contrast agent was given IV to delineate the left ventricular endocardial borders. The left ventricular internal cavity size was mildly dilated. There is borderline asymmetric left ventricular hypertrophy of the septal segment. Left ventricular diastolic parameters are consistent with Grade I diastolic dysfunction (impaired relaxation). Right Ventricle: The right ventricular size is normal. No increase in right ventricular wall thickness. Right ventricular systolic function is normal. Left Atrium: Left atrial size was normal in size. Right Atrium: Right atrial size was normal in size. Pericardium: There is no evidence of pericardial effusion. Mitral Valve: The mitral valve is normal in structure. No evidence of mitral valve regurgitation. Tricuspid Valve: The tricuspid valve is normal in structure. Tricuspid valve regurgitation is trivial. Aortic Valve: The aortic valve is grossly normal. Aortic valve regurgitation is not visualized. Aortic valve peak gradient measures 6.4 mmHg. Pulmonic Valve: The pulmonic valve was normal in structure.  Pulmonic valve regurgitation is not visualized. Aorta: The ascending aorta was not well visualized. IAS/Shunts: No atrial level shunt detected by color flow Doppler.  LEFT VENTRICLE PLAX 2D LVIDd:         4.90 cm   Diastology LVIDs:         3.60 cm   LV e' medial:    3.92 cm/s LV PW:         1.10 cm   LV E/e' medial:  9.8 LV IVS:        1.40 cm  LV e' lateral:   4.68 cm/s LVOT diam:     2.00 cm   LV E/e' lateral: 8.2 LV SV:         58 LV SV Index:   27 LVOT Area:     3.14 cm  RIGHT VENTRICLE RV Basal diam:  3.00 cm RV S prime:     14.80 cm/s TAPSE (M-mode): 2.2 cm LEFT ATRIUM             Index        RIGHT ATRIUM           Index LA diam:        4.00 cm 1.86 cm/m   RA Area:     12.90 cm LA Vol (A2C):   38.6 ml 17.95 ml/m  RA Volume:   32.10 ml  14.93 ml/m LA Vol (A4C):   40.0 ml 18.60 ml/m LA Biplane Vol: 39.8 ml 18.51 ml/m  AORTIC VALVE                 PULMONIC VALVE AV Area (Vmax): 2.03 cm     PV Vmax:       0.90 m/s AV Vmax:        126.00 cm/s  PV Peak grad:  3.2 mmHg AV Peak Grad:   6.4 mmHg LVOT Vmax:      81.60 cm/s LVOT Vmean:     61.300 cm/s LVOT VTI:       0.185 m  AORTA Ao Root diam: 3.20 cm Ao Asc diam:  3.00 cm MITRAL VALVE MV Area (PHT): 3.12 cm    SHUNTS MV Decel Time: 243 msec    Systemic VTI:  0.18 m MV E velocity: 38.60 cm/s  Systemic Diam: 2.00 cm MV A velocity: 67.30 cm/s MV E/A ratio:  0.57 Dwayne D Callwood MD Electronically signed by Yolonda Kida MD Signature Date/Time: 11/30/2021/5:25:30 PM    Final      Scheduled Meds:  amLODipine  5 mg Oral Daily   aspirin  325 mg Oral Daily   carvedilol  6.25 mg Oral BID WC   clopidogrel  75 mg Oral QPM   escitalopram  10 mg Oral q AM   furosemide  20 mg Oral Daily   irbesartan  300 mg Oral Daily   And   hydrochlorothiazide  12.5 mg Oral Daily   isosorbide mononitrate  60 mg Oral Daily   melatonin  2.5 mg Oral QHS   pantoprazole  40 mg Oral Daily   pravastatin  40 mg Oral q1800   sodium chloride flush  3 mL Intravenous Q12H    umeclidinium-vilanterol  1 puff Inhalation Daily   Continuous Infusions:  heparin 1,350 Units/hr (11/30/21 1717)     LOS: 2 days     Enzo Bi, MD [facility] If 7PM-7AM, please contact night-coverage 12/01/2021, 1:23 AM

## 2021-11-30 NOTE — ED Notes (Signed)
Callwood, MD at bedside.

## 2021-11-30 NOTE — ED Notes (Signed)
Patient sleeping at this time. NAD noted. Empty meal tray at bedside.

## 2021-11-30 NOTE — Progress Notes (Signed)
[place] for heparin infusion Indication: chest pain/ACS  Allergies  Allergen Reactions   Rosuvastatin     Other reaction(s): Other (See Comments) Extreme joint pain & fatigue     Patient Measurements: Height: 5\' 7"  (170.2 cm) Weight: 104.8 kg (231 lb 0.7 oz) IBW/kg (Calculated) : 66.1 Heparin Dosing Weight: 89.3 kg  Vital Signs: Temp: 98.4 F (36.9 C) (12/23 1351) Temp Source: Oral (12/23 1351) BP: 136/53 (12/23 2300) Pulse Rate: 72 (12/23 2300)  Labs: Recent Labs    11/29/21 1350 11/29/21 1633 11/29/21 1802 11/29/21 2234 11/30/21 0103  HGB 14.9  --   --   --   --   HCT 42.6  --   --   --   --   PLT 225  --   --   --   --   APTT  --   --  28  --   --   LABPROT  --   --  13.1  --   --   INR  --   --  1.0  --   --   HEPARINUNFRC  --   --   --   --  0.39  CREATININE 1.10  --   --   --   --   TROPONINIHS 222* 901*  --  1,501*  --      Estimated Creatinine Clearance: 76.2 mL/min (by C-G formula based on SCr of 1.1 mg/dL).   Medical History: Past Medical History:  Diagnosis Date   Anemia    Anxiety    Arthritis    fingers and hands   Cancer (HCC)    skin cancer   COPD (chronic obstructive pulmonary disease) (HCC)    wheezing   Coronary artery disease    Dyspnea    GERD (gastroesophageal reflux disease)    Headache    2-3 times/ week   History of hiatal hernia    HOH (hard of hearing)    Hypercholesteremia    Hypertension    controlled   Pneumonia    Sleep apnea    CPAP   Wears dentures    upper    Medications:  Per chart review, no anticoagulation noted prior to admission.   Assessment: 65 y.o. male who presents to the urgent care complaining of central substernal chest pain. Pharmacy has been consulted for heparin.   Goal of Therapy:  Heparin level 0.3-0.7 units/ml Monitor platelets by anticoagulation protocol: Yes   Plan:  12/24:  HL @ 0103 = 0.39, therapeutic X 1 Will continue this pt on  current rate and recheck HL in 6 hrs on 12/24 @ 0700.   Mivaan Corbitt D 11/30/2021,1:29 AM

## 2021-11-30 NOTE — Progress Notes (Signed)
[place] for heparin infusion Indication: chest pain/ACS  Allergies  Allergen Reactions   Rosuvastatin     Other reaction(s): Other (See Comments) Extreme joint pain & fatigue     Patient Measurements: Height: 5\' 7"  (170.2 cm) Weight: 104.8 kg (231 lb 0.7 oz) IBW/kg (Calculated) : 66.1 Heparin Dosing Weight: 89.3 kg  Vital Signs: BP: 130/58 (12/24 0700) Pulse Rate: 77 (12/24 0700)  Labs: Recent Labs    11/29/21 1350 11/29/21 1633 11/29/21 1802 11/29/21 2234 11/30/21 0103 11/30/21 0310 11/30/21 0454 11/30/21 0735  HGB 14.9  --   --   --   --   --  14.1  --   HCT 42.6  --   --   --   --   --  40.6  --   PLT 225  --   --   --   --   --  210  --   APTT  --   --  28  --   --   --   --   --   LABPROT  --   --  13.1  --   --   --   --   --   INR  --   --  1.0  --   --   --   --   --   HEPARINUNFRC  --   --   --   --  0.39  --   --  <0.10*  CREATININE 1.10  --   --   --   --   --  1.01  --   TROPONINIHS 222*   < >  --    < > 1,510* 1,130* 944*  --    < > = values in this interval not displayed.     Estimated Creatinine Clearance: 83 mL/min (by C-G formula based on SCr of 1.01 mg/dL).   Medical History: Past Medical History:  Diagnosis Date   Anemia    Anxiety    Arthritis    fingers and hands   Cancer (HCC)    skin cancer   COPD (chronic obstructive pulmonary disease) (HCC)    wheezing   Coronary artery disease    Dyspnea    GERD (gastroesophageal reflux disease)    Headache    2-3 times/ week   History of hiatal hernia    HOH (hard of hearing)    Hypercholesteremia    Hypertension    controlled   Pneumonia    Sleep apnea    CPAP   Wears dentures    upper    Medications:  Per chart review, no anticoagulation noted prior to admission.   Assessment: 65 y.o. male who presents to the urgent care complaining of central substernal chest pain. Pharmacy has been consulted for heparin.   12/24:  HL @ 0103 = 0.39,  thera  1100 u/hr 12/24 0735 HL= <0.10     subthera, bolus+inc rate to 1350 u/hr  Goal of Therapy:  Heparin level 0.3-0.7 units/ml Monitor platelets by anticoagulation protocol: Yes   Plan:  12/24 0735 HL= <0.10   subtherapeutic RN to confirm drip is running Will order bolus of 2600 units and increase drip rate to 1350 units/hr recheck HL in 6 hrs from rate change  Yeilin Zweber A 11/30/2021,9:01 AM

## 2021-11-30 NOTE — Consult Note (Signed)
CARDIOLOGY CONSULT NOTE               Patient ID: Charles Blake MRN: [id number] DOB/AGE: 12/22/1955 65 y.o.  Admit date: 11/29/2021 Referring Physician Dr Enzo Bi hospitalist Primary Physician Thereasa Distance primary Primary Cardiologist Dr. Nehemiah Massed Reason for Consultation possible STEMI unstable angina  HPI: Patient is a [age]-year-old malepresenting with midsternal chest discomfort suggestive of angina EKG was nondiagnostic because of left bundle branch block patient troponin was found to be elevated and subsequently follow-up with the [facility]  He states to be feeling relatively well no palpable therapy.  Patient 1 troponins are 900 he had significant improvement in symptoms when she came to emergency room after receiving nitroglycerin.  Patient was extremely hypertensive.  Blood pressure was adequately controlled placed on Nitropaste and cardiology was consulted he was pain-free in the emergency room by the time I saw him.  He described the pain and discomfort in his mid chest normally when he is angina symptoms upper chest and neck he has had a recent episode requiring nitroglycerin in several weeks.  He states his pain was much more intense than normal tested prompted him to come to the emergency room  Review of systems complete and found to be negative unless listed above     Past Medical History:  Diagnosis Date   Anemia    Anxiety    Arthritis    fingers and hands   Cancer (HCC)    skin cancer   COPD (chronic obstructive pulmonary disease) (HCC)    wheezing   Coronary artery disease    Dyspnea    GERD (gastroesophageal reflux disease)    Headache    2-3 times/ week   History of hiatal hernia    HOH (hard of hearing)    Hypercholesteremia    Hypertension    controlled   Pneumonia    Sleep apnea    CPAP   Wears dentures    upper    Past Surgical History:  Procedure Laterality Date   APPENDECTOMY     CARDIAC CATHETERIZATION     CAROTID ENDARTERECTOMY  Left    CAROTID PTA/STENT INTERVENTION Left 08/20/2020   Procedure: CAROTID PTA/STENT INTERVENTION;  Surgeon: Algernon Huxley, MD;  Location: [place] [facility];  Service: Cardiovascular;  Laterality: Left;   CATARACT EXTRACTION Left    CATARACT EXTRACTION W/PHACO Right 05/24/2018   Procedure: CATARACT EXTRACTION PHACO AND INTRAOCULAR LENS PLACEMENT ([place]) RIGHT;  Surgeon: Eulogio Bear, MD;  Location: [facility];  Service: Ophthalmology;  Laterality: Right;  CPAP, sleep apnea   COLONOSCOPY N/A 05/29/2015   Procedure: COLONOSCOPY;  Surgeon: Lollie Sails, MD;  Location: [facility] ENDOSCOPY;  Service: Endoscopy;  Laterality: N/A;   iliac stents     TONSILLECTOMY      (Not in a hospital admission)  Social History   Socioeconomic History   Marital status: Married    Spouse name: Bretta Bang    Number of children: 1   Years of education: Not on file   Highest education level: Not on file  Occupational History   Occupation: retired   Tobacco Use   Smoking status: Every Day    Packs/day: 0.50    Years: 40.00    Pack years: 20.00    Types: Cigarettes    Last attempt to quit: 11/09/2020    Years since quitting: 1.0   Smokeless tobacco: Former  Scientific laboratory technician Use: Never used  Substance and Sexual  Activity   Alcohol use: Yes    Comment: rarely   Drug use: Never   Sexual activity: Not on file  Other Topics Concern   Not on file  Social History Narrative   Lives at home with wife.    Social Determinants of Health   Financial Resource Strain: Not on file  Food Insecurity: Not on file  Transportation Needs: Not on file  Physical Activity: Not on file  Stress: Not on file  Social Connections: Not on file  Intimate Partner Violence: Not on file    Family History  Problem Relation Age of Onset   Heart attack Mother    Hypertension Mother    Kidney disease Mother    Heart attack Father    Hypertension Father    Colon cancer Father    Heart attack Brother        Review of systems complete and found to be negative unless listed above      PHYSICAL EXAM  General: Well developed, well nourished, in no acute distress HEENT:  Normocephalic and atramatic Neck:  No JVD.  Lungs: Clear bilaterally to auscultation and percussion. Heart: HRRR . Normal S1 and S2 without gallops or murmurs.  Abdomen: Bowel sounds are positive, abdomen soft and non-tender  Msk:  Back normal, normal gait. Normal strength and tone for age. Extremities: No clubbing, cyanosis or edema.   Neuro: Alert and oriented X 3. Psych:  Good affect, responds appropriately  Labs:   Lab Results  Component Value Date   WBC 10.1 11/30/2021   HGB 14.1 11/30/2021   HCT 40.6 11/30/2021   MCV 93.5 11/30/2021   PLT 210 11/30/2021    Recent Labs  Lab 11/30/21 0454  NA 135  K 3.9  CL 104  CO2 27  BUN 15  CREATININE 1.01  CALCIUM 8.8*  GLUCOSE 123*   No results found for: CKTOTAL, CKMB, CKMBINDEX, TROPONINI No results found for: CHOL No results found for: HDL No results found for: LDLCALC No results found for: TRIG No results found for: CHOLHDL No results found for: LDLDIRECT    Radiology: DG Chest 2 View  Result Date: 11/29/2021 CLINICAL DATA:  Chest pain.  COPD. EXAM: CHEST - 2 VIEW COMPARISON:  08/03/2020 FINDINGS: The heart size and mediastinal contours are within normal limits. Low lung volumes are noted. Both lungs are clear. No evidence of pleural effusion. The visualized skeletal structures are unremarkable. IMPRESSION: Low lung volumes. No active cardiopulmonary disease. Electronically Signed   By: Marlaine Hind M.D.   On: 11/29/2021 14:19    EKG: Left bundle branch block normal sinus rhythm rate of 70  ASSESSMENT AND PLAN:  Unstable angina Elevated troponins Possible non-STEMI Obesity Coronary artery disease History of PCI and stent History of smoking Peripheral vascular disease Probable obstructive sleep apnea Abnormal EKG left bundle branch  block COPD . Plan Agree with admit to telemetry Continue short-term anticoagulation Follow-up troponins and EKGs Echocardiogram will be helpful for left ventricular function wall motion Recommend cardiac cath prior to discharge Continue aspirin Plavix for peripheral vascular disease and possible ACS Sleep study for obstructive sleep apnea recommend weight loss CPAP Maintain statin therapy for hyperlipidemia Hypertension management and control coronary irbesartan HCT imdur Lasix amlodipine Inhalers like Anoro for COPD Vies patient to refrain from tobacco abuse  Signed: Yolonda Kida MD 11/30/2021, 10:41 AM

## 2021-11-30 NOTE — ED Notes (Signed)
Family at bedside. Update given at this time.

## 2021-11-30 NOTE — ED Notes (Signed)
Billie Ruddy, MD at bedside.

## 2021-12-01 DIAGNOSIS — I214 Non-ST elevation (NSTEMI) myocardial infarction: Secondary | ICD-10-CM | POA: Diagnosis not present

## 2021-12-01 LAB — CBC
HCT: 41.5 % (ref 39.0–52.0)
Hemoglobin: 14.7 g/dL (ref 13.0–17.0)
MCH: 32.9 pg (ref 26.0–34.0)
MCHC: 35.4 g/dL (ref 30.0–36.0)
MCV: 92.8 fL (ref 80.0–100.0)
Platelets: 201 10*3/uL (ref 150–400)
RBC: 4.47 MIL/uL (ref 4.22–5.81)
RDW: 12.3 % (ref 11.5–15.5)
WBC: 8.2 10*3/uL (ref 4.0–10.5)
nRBC: 0 % (ref 0.0–0.2)

## 2021-12-01 LAB — BASIC METABOLIC PANEL
Anion gap: 6 (ref 5–15)
BUN: 15 mg/dL (ref 8–23)
CO2: 25 mmol/L (ref 22–32)
Calcium: 8.6 mg/dL — ABNORMAL LOW (ref 8.9–10.3)
Chloride: 104 mmol/L (ref 98–111)
Creatinine, Ser: 0.88 mg/dL (ref 0.61–1.24)
GFR, Estimated: >60 mL/min (ref >60)
Glucose, Bld: 126 mg/dL — ABNORMAL HIGH (ref 70–99)
Potassium: 4 mmol/L (ref 3.5–5.1)
Sodium: 135 mmol/L (ref 135–145)

## 2021-12-01 LAB — MRSA NEXT GEN BY PCR, NASAL: MRSA by PCR Next Gen: DETECTED — AB

## 2021-12-01 LAB — MAGNESIUM: Magnesium: 2.3 mg/dL (ref 1.7–2.4)

## 2021-12-01 LAB — HEPARIN LEVEL (UNFRACTIONATED): Heparin Unfractionated: 0.4 IU/mL (ref 0.30–0.70)

## 2021-12-01 MED ORDER — ASPIRIN EC 81 MG PO TBEC
81.0000 mg | DELAYED_RELEASE_TABLET | Freq: Every day | ORAL | Status: DC
  Administered 2021-12-01 – 2021-12-02 (×2): 81 mg via ORAL
  Filled 2021-12-01 (×2): qty 1

## 2021-12-01 MED ORDER — MUPIROCIN 2 % EX OINT
1.0000 "application " | TOPICAL_OINTMENT | Freq: Two times a day (BID) | CUTANEOUS | Status: DC
  Administered 2021-12-01 – 2021-12-03 (×5): 1 via NASAL
  Filled 2021-12-01: qty 22

## 2021-12-01 NOTE — Progress Notes (Signed)
[place] for heparin infusion Indication: chest pain/ACS  Allergies  Allergen Reactions   Rosuvastatin     Other reaction(s): Other (See Comments) Extreme joint pain & fatigue     Patient Measurements: Height: 5\' 7"  (170.2 cm) Weight: 104.8 kg (231 lb 0.7 oz) IBW/kg (Calculated) : 66.1 Heparin Dosing Weight: 89.3 kg  Vital Signs: BP: 147/63 (12/25 0035) Pulse Rate: 61 (12/25 0035)  Labs: Recent Labs    11/29/21 1350 11/29/21 1633 11/29/21 1802 11/29/21 2234 11/30/21 0103 11/30/21 0310 11/30/21 0454 11/30/21 0735 11/30/21 1644 12/01/21 0005  HGB 14.9  --   --   --   --   --  14.1  --   --   --   HCT 42.6  --   --   --   --   --  40.6  --   --   --   PLT 225  --   --   --   --   --  210  --   --   --   APTT  --   --  28  --   --   --   --   --   --   --   LABPROT  --   --  13.1  --   --   --   --   --   --   --   INR  --   --  1.0  --   --   --   --   --   --   --   HEPARINUNFRC  --   --   --    < > 0.39  --   --  <0.10* 0.33 0.40  CREATININE 1.10  --   --   --   --   --  1.01  --   --   --   TROPONINIHS 222*   < >  --    < > 1,510* 1,130* 944*  --   --   --    < > = values in this interval not displayed.     Estimated Creatinine Clearance: 83 mL/min (by C-G formula based on SCr of 1.01 mg/dL).   Medical History: Past Medical History:  Diagnosis Date   Anemia    Anxiety    Arthritis    fingers and hands   Cancer (HCC)    skin cancer   COPD (chronic obstructive pulmonary disease) (HCC)    wheezing   Coronary artery disease    Dyspnea    GERD (gastroesophageal reflux disease)    Headache    2-3 times/ week   History of hiatal hernia    HOH (hard of hearing)    Hypercholesteremia    Hypertension    controlled   Pneumonia    Sleep apnea    CPAP   Wears dentures    upper    Medications:  Per chart review, no anticoagulation noted prior to admission.   Assessment: 65 y.o. male who presents to the urgent  care complaining of central substernal chest pain. Pharmacy has been consulted for heparin.   Date Time HL Rate/comment 12/24 0103 0.39 1100 un/hr, therapeutic 12/24  0735  <0.10 1100 un/hr, subtherapeutic 12/24 1644 0.33 1350 un/hr, therapeutic 12/25   0005    0.40     1350 un/hr, therapeutic   Goal of Therapy:  Heparin level 0.3-0.7 units/ml Monitor platelets by anticoagulation protocol: Yes   Plan:  12/25:  HL @ 0005 = 0.40, therapeutic X 2 Will continue pt on current rate and recheck HL on 12/26 with AM labs.   Camya Haydon D, PharmD  12/01/2021,1:25 AM

## 2021-12-01 NOTE — Plan of Care (Signed)
  Problem: Health Behavior/Discharge Planning: Goal: Ability to manage health-related needs will improve Outcome: Progressing   

## 2021-12-01 NOTE — Progress Notes (Signed)
PROGRESS NOTE    Charles Blake  GGE:[id number] DOB: 06-08-1956 DOA: 11/29/2021 PCP: Sofie Hartigan, MD  [id number]   Assessment & Plan:   Principal Problem:   NSTEMI (non-ST elevated myocardial infarction) [facility])   Charles Blake is a 65 y.o. male with medical history significant of carotid stenosis s/p stent, PAD s/p stent, HTN, COPD, OSA on CPAP, known LBBB who presented with chest pain.    # NSTEMI --Trop 22 and peaked at 1500.  Chest pain resolved with nitro paste.   --started on heparin gtt Plan: --cont heparin gtt --add ASA 81 mg daily  --heart cath on Tuesday   # HTN --BP elevated on presentation, improved now. --on home amlodipine, coreg, lasix, irbesartan and HCTZ, and Imdur --cont home regimen   # Carotid stenosis s/p stent on the left # PAD s/p stent --cont home Plavix   # COPD --stable. --cont home Anoro   # OSA on CPAP --cont CPAP nightly   # HLD --cont statin  # GERD --cont PPI   DVT prophylaxis: [id number] gtt Code Status: Full code  Family Communication:  Level of care: Progressive Dispo:   The patient is from: home Anticipated d/c is to: home Anticipated d/c date is: 2-3 days Patient currently is not medically ready to d/c due to: pending heart cath   Subjective and Interval History:  No chest pain.  No dyspnea.  Eating well.   Objective: Vitals:   12/01/21 1000 12/01/21 1530 12/01/21 1633 12/01/21 1909  BP: (!) 161/66 140/64 (!) 150/66 (!) 150/68  Pulse: 70 70 68 66  Resp: 16 18 20 18   Temp:  98.4 F (36.9 C) 97.8 F (36.6 C) 98.8 F (37.1 C)  TempSrc:  Oral    SpO2: 96% 96% 95% 94%  Weight:      Height:        Intake/Output Summary (Last 24 hours) at 12/01/2021 2058 Last data filed at 12/01/2021 2034 Gross per 24 hour  Intake 240 ml  Output 1900 ml  Net -1660 ml   Filed Weights   11/29/21 1343  Weight: 104.8 kg    Examination:   Constitutional: NAD, AAOx3 HEENT: conjunctivae and lids normal,  EOMI CV: No cyanosis.   RESP: normal respiratory effort, on RA Extremities: No effusions, edema in BLE SKIN: warm, dry Neuro: II - XII grossly intact.   Psych: Normal mood and affect.  Appropriate judgement and reason   Data Reviewed: I have personally reviewed following labs and imaging studies  CBC: Recent Labs  Lab 11/29/21 1350 11/30/21 0454 12/01/21 0642  WBC 10.3 10.1 8.2  HGB 14.9 14.1 14.7  HCT 42.6 40.6 41.5  MCV 93.0 93.5 92.8  PLT 225 210 681   Basic Metabolic Panel: Recent Labs  Lab 11/29/21 1350 11/30/21 0454 12/01/21 0642  NA 137 135 135  K 3.9 3.9 4.0  CL 106 104 104  CO2 26 27 25   GLUCOSE 117* 123* 126*  BUN 15 15 15   CREATININE 1.10 1.01 0.88  CALCIUM 8.9 8.8* 8.6*  MG  --  2.1 2.3   GFR: Estimated Creatinine Clearance: 95.3 mL/min (by C-G formula based on SCr of 0.88 mg/dL). Liver Function Tests: No results for input(s): AST, ALT, ALKPHOS, BILITOT, PROT, ALBUMIN in the last 168 hours. No results for input(s): LIPASE, AMYLASE in the last 168 hours. No results for input(s): AMMONIA in the last 168 hours. Coagulation Profile: Recent Labs  Lab 11/29/21 1802  INR 1.0   Cardiac Enzymes:  No results for input(s): CKTOTAL, CKMB, CKMBINDEX, TROPONINI in the last 168 hours. BNP (last 3 results) No results for input(s): PROBNP in the last 8760 hours. HbA1C: No results for input(s): HGBA1C in the last 72 hours. CBG: No results for input(s): GLUCAP in the last 168 hours. Lipid Profile: No results for input(s): CHOL, HDL, LDLCALC, TRIG, CHOLHDL, LDLDIRECT in the last 72 hours. Thyroid Function Tests: No results for input(s): TSH, T4TOTAL, FREET4, T3FREE, THYROIDAB in the last 72 hours. Anemia Panel: No results for input(s): VITAMINB12, FOLATE, FERRITIN, TIBC, IRON, RETICCTPCT in the last 72 hours. Sepsis Labs: No results for input(s): PROCALCITON, LATICACIDVEN in the last 168 hours.  Recent Results (from the past 240 hour(s))  Resp Panel by  RT-PCR (Flu A&B, Covid) Nasopharyngeal Swab     Status: None   Collection Time: 11/29/21  5:16 PM   Specimen: Nasopharyngeal Swab; Nasopharyngeal(NP) swabs in vial transport medium  Result Value Ref Range Status   SARS Coronavirus 2 by RT PCR NEGATIVE NEGATIVE Final    Comment: (NOTE) SARS-CoV-2 target nucleic acids are NOT DETECTED.  The SARS-CoV-2 RNA is generally detectable in upper respiratory specimens during the acute phase of infection. The lowest concentration of SARS-CoV-2 viral copies this assay can detect is 138 copies/mL. A negative result does not preclude SARS-Cov-2 infection and should not be used as the sole basis for treatment or other patient management decisions. A negative result may occur with  improper specimen collection/handling, submission of specimen other than nasopharyngeal swab, presence of viral mutation(s) within the areas targeted by this assay, and inadequate number of viral copies(<138 copies/mL). A negative result must be combined with clinical observations, patient history, and epidemiological information. The expected result is Negative.  Fact Sheet for Patients:  EntrepreneurPulse.com.au  Fact Sheet for Healthcare Providers:  IncredibleEmployment.be  This test is no t yet approved or cleared by the [place] FDA and  has been authorized for detection and/or diagnosis of SARS-CoV-2 by FDA under an Emergency Use Authorization (EUA). This EUA will remain  in effect (meaning this test can be used) for the duration of the COVID-19 declaration under Section 564(b)(1) of the Act, 21 U.S.C.section 360bbb-3(b)(1), unless the authorization is terminated  or revoked sooner.       Influenza A by PCR NEGATIVE NEGATIVE Final   Influenza B by PCR NEGATIVE NEGATIVE Final    Comment: (NOTE) The Xpert Xpress SARS-CoV-2/FLU/RSV plus assay is intended as an aid in the diagnosis of influenza from Nasopharyngeal swab  specimens and should not be used as a sole basis for treatment. Nasal washings and aspirates are unacceptable for Xpert Xpress SARS-CoV-2/FLU/RSV testing.  Fact Sheet for Patients: EntrepreneurPulse.com.au  Fact Sheet for Healthcare Providers: IncredibleEmployment.be  This test is not yet approved or cleared by the [place] FDA and has been authorized for detection and/or diagnosis of SARS-CoV-2 by FDA under an Emergency Use Authorization (EUA). This EUA will remain in effect (meaning this test can be used) for the duration of the COVID-19 declaration under Section 564(b)(1) of the Act, 21 U.S.C. section 360bbb-3(b)(1), unless the authorization is terminated or revoked.  Performed at [facility], [address], [place], [place] [address]       Radiology Studies: ECHOCARDIOGRAM COMPLETE  Result Date: 11/30/2021    ECHOCARDIOGRAM REPORT   Patient Name:   COTTON BECKLEY Date of Exam: 11/30/2021 Medical Rec #:  [id number]         Height:       67.0 in  Accession #:    [id number]        Weight:       231.0 lb Date of Birth:  09-24-1956          BSA:          2.150 m Patient Age:    77 years          BP:           130/58 mmHg Patient Gender: M                 HR:           68 bpm. Exam Location:  [facility] Procedure: 2D Echo and Intracardiac Opacification Agent Indications:     NSTEMI I21.4  History:         Patient has no prior history of Echocardiogram examinations.  Sonographer:     Kathlen Brunswick RDCS Referring Phys:  [id number] [place] Diagnosing Phys: Yolonda Kida MD  Sonographer Comments: Technically difficult study due to poor echo windows. Image acquisition challenging due to patient body habitus. IMPRESSIONS  1. Left ventricular ejection fraction, by estimation, is 50 to 55%. The left ventricle has low normal function. The left ventricle has no regional wall motion abnormalities. The left ventricular internal cavity size was mildly  dilated. Left ventricular diastolic parameters are consistent with Grade I diastolic dysfunction (impaired relaxation).  2. Right ventricular systolic function is normal. The right ventricular size is normal.  3. The mitral valve is normal in structure. No evidence of mitral valve regurgitation.  4. The aortic valve is grossly normal. Aortic valve regurgitation is not visualized. FINDINGS  Left Ventricle: Left ventricular ejection fraction, by estimation, is 50 to 55%. The left ventricle has low normal function. The left ventricle has no regional wall motion abnormalities. Definity contrast agent was given IV to delineate the left ventricular endocardial borders. The left ventricular internal cavity size was mildly dilated. There is borderline asymmetric left ventricular hypertrophy of the septal segment. Left ventricular diastolic parameters are consistent with Grade I diastolic dysfunction (impaired relaxation). Right Ventricle: The right ventricular size is normal. No increase in right ventricular wall thickness. Right ventricular systolic function is normal. Left Atrium: Left atrial size was normal in size. Right Atrium: Right atrial size was normal in size. Pericardium: There is no evidence of pericardial effusion. Mitral Valve: The mitral valve is normal in structure. No evidence of mitral valve regurgitation. Tricuspid Valve: The tricuspid valve is normal in structure. Tricuspid valve regurgitation is trivial. Aortic Valve: The aortic valve is grossly normal. Aortic valve regurgitation is not visualized. Aortic valve peak gradient measures 6.4 mmHg. Pulmonic Valve: The pulmonic valve was normal in structure. Pulmonic valve regurgitation is not visualized. Aorta: The ascending aorta was not well visualized. IAS/Shunts: No atrial level shunt detected by color flow Doppler.  LEFT VENTRICLE PLAX 2D LVIDd:         4.90 cm   Diastology LVIDs:         3.60 cm   LV e' medial:    3.92 cm/s LV PW:         1.10 cm   LV  E/e' medial:  9.8 LV IVS:        1.40 cm   LV e' lateral:   4.68 cm/s LVOT diam:     2.00 cm   LV E/e' lateral: 8.2 LV SV:         58 LV SV Index:   27 LVOT Area:  3.14 cm  RIGHT VENTRICLE RV Basal diam:  3.00 cm RV S prime:     14.80 cm/s TAPSE (M-mode): 2.2 cm LEFT ATRIUM             Index        RIGHT ATRIUM           Index LA diam:        4.00 cm 1.86 cm/m   RA Area:     12.90 cm LA Vol (A2C):   38.6 ml 17.95 ml/m  RA Volume:   32.10 ml  14.93 ml/m LA Vol (A4C):   40.0 ml 18.60 ml/m LA Biplane Vol: 39.8 ml 18.51 ml/m  AORTIC VALVE                 PULMONIC VALVE AV Area (Vmax): 2.03 cm     PV Vmax:       0.90 m/s AV Vmax:        126.00 cm/s  PV Peak grad:  3.2 mmHg AV Peak Grad:   6.4 mmHg LVOT Vmax:      81.60 cm/s LVOT Vmean:     61.300 cm/s LVOT VTI:       0.185 m  AORTA Ao Root diam: 3.20 cm Ao Asc diam:  3.00 cm MITRAL VALVE MV Area (PHT): 3.12 cm    SHUNTS MV Decel Time: 243 msec    Systemic VTI:  0.18 m MV E velocity: 38.60 cm/s  Systemic Diam: 2.00 cm MV A velocity: 67.30 cm/s MV E/A ratio:  0.57 Dwayne D Callwood MD Electronically signed by Yolonda Kida MD Signature Date/Time: 11/30/2021/5:25:30 PM    Final      Scheduled Meds:  amLODipine  5 mg Oral Daily   aspirin EC  81 mg Oral Daily   carvedilol  6.25 mg Oral BID WC   clopidogrel  75 mg Oral QPM   escitalopram  10 mg Oral q AM   furosemide  20 mg Oral Daily   irbesartan  300 mg Oral Daily   And   hydrochlorothiazide  12.5 mg Oral Daily   isosorbide mononitrate  60 mg Oral Daily   melatonin  2.5 mg Oral QHS   pantoprazole  40 mg Oral Daily   pravastatin  40 mg Oral q1800   sodium chloride flush  3 mL Intravenous Q12H   umeclidinium-vilanterol  1 puff Inhalation Daily   Continuous Infusions:  heparin 1,350 Units/hr (12/01/21 0553)     LOS: 2 days     Enzo Bi, MD [facility] If 7PM-7AM, please contact night-coverage 12/01/2021, 8:58 PM

## 2021-12-01 NOTE — ED Notes (Signed)
Report received from Hannah, RN 

## 2021-12-01 NOTE — ED Notes (Signed)
Lab at the bedside 

## 2021-12-01 NOTE — Progress Notes (Signed)
[facility]    SUBJECTIVE: Patient stable no further chest pain positive troponin suggestive of non-STEMI we will proceed with cardiac cath on Tuesday when the Cath Lab opens   Vitals:   12/01/21 0610 12/01/21 0645 12/01/21 0826 12/01/21 1000  BP: (!) 136/52 (!) 142/60  (!) 161/66  Pulse: 64 63  70  Resp: 13 17  16   Temp:      TempSrc:      SpO2: 93% 96% 97% 96%  Weight:      Height:         Intake/Output Summary (Last 24 hours) at 12/01/2021 1315 Last data filed at 12/01/2021 1001 Gross per 24 hour  Intake 250 ml  Output 1600 ml  Net -1350 ml      PHYSICAL EXAM  General: Well developed, well nourished, in no acute distress HEENT:  Normocephalic and atramatic Neck:  No JVD.  Lungs: Clear bilaterally to auscultation and percussion. Heart: HRRR . Normal S1 and S2 without gallops or murmurs.  Abdomen: Bowel sounds are positive, abdomen soft and non-tender  Msk:  Back normal, normal gait. Normal strength and tone for age. Extremities: No clubbing, cyanosis or edema.   Neuro: Alert and oriented X 3. Psych:  Good affect, responds appropriately   LABS: Basic Metabolic Panel: Recent Labs    11/30/21 0454 12/01/21 0642  NA 135 135  K 3.9 4.0  CL 104 104  CO2 27 25  GLUCOSE 123* 126*  BUN 15 15  CREATININE 1.01 0.88  CALCIUM 8.8* 8.6*  MG 2.1 2.3   Liver Function Tests: No results for input(s): AST, ALT, ALKPHOS, BILITOT, PROT, ALBUMIN in the last 72 hours. No results for input(s): LIPASE, AMYLASE in the last 72 hours. CBC: Recent Labs    11/30/21 0454 12/01/21 0642  WBC 10.1 8.2  HGB 14.1 14.7  HCT 40.6 41.5  MCV 93.5 92.8  PLT 210 201   Cardiac Enzymes: No results for input(s): CKTOTAL, CKMB, CKMBINDEX, TROPONINI in the last 72 hours. BNP: Invalid input(s): POCBNP D-Dimer: No results for input(s): DDIMER in the last 72 hours. Hemoglobin A1C: No results for input(s): HGBA1C in the last 72 hours. Fasting Lipid Panel: No results for input(s):  CHOL, HDL, LDLCALC, TRIG, CHOLHDL, LDLDIRECT in the last 72 hours. Thyroid Function Tests: No results for input(s): TSH, T4TOTAL, T3FREE, THYROIDAB in the last 72 hours.  Invalid input(s): FREET3 Anemia Panel: No results for input(s): VITAMINB12, FOLATE, FERRITIN, TIBC, IRON, RETICCTPCT in the last 72 hours.  DG Chest 2 View  Result Date: 11/29/2021 CLINICAL DATA:  Chest pain.  COPD. EXAM: CHEST - 2 VIEW COMPARISON:  08/03/2020 FINDINGS: The heart size and mediastinal contours are within normal limits. Low lung volumes are noted. Both lungs are clear. No evidence of pleural effusion. The visualized skeletal structures are unremarkable. IMPRESSION: Low lung volumes. No active cardiopulmonary disease. Electronically Signed   By: Marlaine Hind M.D.   On: 11/29/2021 14:19   ECHOCARDIOGRAM COMPLETE  Result Date: 11/30/2021    ECHOCARDIOGRAM REPORT   Patient Name:   Charles Blake Date of Exam: 11/30/2021 Medical Rec #:  [id number]         Height:       67.0 in Accession #:    [id number]        Weight:       231.0 lb Date of Birth:  1956-07-11          BSA:          2.150  m Patient Age:    65 years          BP:           130/58 mmHg Patient Gender: M                 HR:           68 bpm. Exam Location:  [facility] Procedure: 2D Echo and Intracardiac Opacification Agent Indications:     NSTEMI I21.4  History:         Patient has no prior history of Echocardiogram examinations.  Sonographer:     Kathlen Brunswick RDCS Referring Phys:  [id number] [place] Diagnosing Phys: Yolonda Kida MD  Sonographer Comments: Technically difficult study due to poor echo windows. Image acquisition challenging due to patient body habitus. IMPRESSIONS  1. Left ventricular ejection fraction, by estimation, is 50 to 55%. The left ventricle has low normal function. The left ventricle has no regional wall motion abnormalities. The left ventricular internal cavity size was mildly dilated. Left ventricular diastolic parameters are  consistent with Grade I diastolic dysfunction (impaired relaxation).  2. Right ventricular systolic function is normal. The right ventricular size is normal.  3. The mitral valve is normal in structure. No evidence of mitral valve regurgitation.  4. The aortic valve is grossly normal. Aortic valve regurgitation is not visualized. FINDINGS  Left Ventricle: Left ventricular ejection fraction, by estimation, is 50 to 55%. The left ventricle has low normal function. The left ventricle has no regional wall motion abnormalities. Definity contrast agent was given IV to delineate the left ventricular endocardial borders. The left ventricular internal cavity size was mildly dilated. There is borderline asymmetric left ventricular hypertrophy of the septal segment. Left ventricular diastolic parameters are consistent with Grade I diastolic dysfunction (impaired relaxation). Right Ventricle: The right ventricular size is normal. No increase in right ventricular wall thickness. Right ventricular systolic function is normal. Left Atrium: Left atrial size was normal in size. Right Atrium: Right atrial size was normal in size. Pericardium: There is no evidence of pericardial effusion. Mitral Valve: The mitral valve is normal in structure. No evidence of mitral valve regurgitation. Tricuspid Valve: The tricuspid valve is normal in structure. Tricuspid valve regurgitation is trivial. Aortic Valve: The aortic valve is grossly normal. Aortic valve regurgitation is not visualized. Aortic valve peak gradient measures 6.4 mmHg. Pulmonic Valve: The pulmonic valve was normal in structure. Pulmonic valve regurgitation is not visualized. Aorta: The ascending aorta was not well visualized. IAS/Shunts: No atrial level shunt detected by color flow Doppler.  LEFT VENTRICLE PLAX 2D LVIDd:         4.90 cm   Diastology LVIDs:         3.60 cm   LV e' medial:    3.92 cm/s LV PW:         1.10 cm   LV E/e' medial:  9.8 LV IVS:        1.40 cm   LV e'  lateral:   4.68 cm/s LVOT diam:     2.00 cm   LV E/e' lateral: 8.2 LV SV:         58 LV SV Index:   27 LVOT Area:     3.14 cm  RIGHT VENTRICLE RV Basal diam:  3.00 cm RV S prime:     14.80 cm/s TAPSE (M-mode): 2.2 cm LEFT ATRIUM             Index  RIGHT ATRIUM           Index LA diam:        4.00 cm 1.86 cm/m   RA Area:     12.90 cm LA Vol (A2C):   38.6 ml 17.95 ml/m  RA Volume:   32.10 ml  14.93 ml/m LA Vol (A4C):   40.0 ml 18.60 ml/m LA Biplane Vol: 39.8 ml 18.51 ml/m  AORTIC VALVE                 PULMONIC VALVE AV Area (Vmax): 2.03 cm     PV Vmax:       0.90 m/s AV Vmax:        126.00 cm/s  PV Peak grad:  3.2 mmHg AV Peak Grad:   6.4 mmHg LVOT Vmax:      81.60 cm/s LVOT Vmean:     61.300 cm/s LVOT VTI:       0.185 m  AORTA Ao Root diam: 3.20 cm Ao Asc diam:  3.00 cm MITRAL VALVE MV Area (PHT): 3.12 cm    SHUNTS MV Decel Time: 243 msec    Systemic VTI:  0.18 m MV E velocity: 38.60 cm/s  Systemic Diam: 2.00 cm MV A velocity: 67.30 cm/s MV E/A ratio:  0.57 Jaivyn Gulla D Ilda Laskin MD Electronically signed by Yolonda Kida MD Signature Date/Time: 11/30/2021/5:25:30 PM    Final      Echo preserved left ventricular function 50 to 55%  TELEMETRY: Normal sinus rhythm nonspecific ST-T changes:  ASSESSMENT AND PLAN:  Principal Problem:   NSTEMI (non-ST elevated myocardial infarction) (HCC) Coronary disease UnStable angina History of PCI and stent Obesity Hyperlipidemia Hypertension Smoking Peripheral vascular disease status post PCI and stent coronary artery GERD continue PPI  Plan Continue telemetry for non-STEMI Recommend cardiac cath on Tuesday for evaluation of non-STEMI Obstructive sleep apnea recommends sleep study CPAP nightly follow-up with pulmonary Continue statin therapy for hyperlipidemia Agree with inhalers for COPD Continue blood pressure medications Peak troponins 1500 consistent with non-STEMI Agree Protonix for GERD type symptoms     Yolonda Kida,  MD, 12/01/2021 1:15 PM

## 2021-12-01 NOTE — ED Notes (Signed)
Rn to bedside to introduce self to pt. Pt awake and eating breakfast.

## 2021-12-02 DIAGNOSIS — I214 Non-ST elevation (NSTEMI) myocardial infarction: Secondary | ICD-10-CM | POA: Diagnosis not present

## 2021-12-02 LAB — CBC
HCT: 43.7 % (ref 39.0–52.0)
Hemoglobin: 15.1 g/dL (ref 13.0–17.0)
MCH: 32 pg (ref 26.0–34.0)
MCHC: 34.6 g/dL (ref 30.0–36.0)
MCV: 92.6 fL (ref 80.0–100.0)
Platelets: 212 10*3/uL (ref 150–400)
RBC: 4.72 MIL/uL (ref 4.22–5.81)
RDW: 12.2 % (ref 11.5–15.5)
WBC: 10 10*3/uL (ref 4.0–10.5)
nRBC: 0 % (ref 0.0–0.2)

## 2021-12-02 LAB — BASIC METABOLIC PANEL
Anion gap: 7 (ref 5–15)
BUN: 20 mg/dL (ref 8–23)
CO2: 24 mmol/L (ref 22–32)
Calcium: 8.6 mg/dL — ABNORMAL LOW (ref 8.9–10.3)
Chloride: 104 mmol/L (ref 98–111)
Creatinine, Ser: 1.1 mg/dL (ref 0.61–1.24)
GFR, Estimated: >60 mL/min (ref >60)
Glucose, Bld: 132 mg/dL — ABNORMAL HIGH (ref 70–99)
Potassium: 4 mmol/L (ref 3.5–5.1)
Sodium: 135 mmol/L (ref 135–145)

## 2021-12-02 LAB — HEPARIN LEVEL (UNFRACTIONATED): Heparin Unfractionated: 0.39 IU/mL (ref 0.30–0.70)

## 2021-12-02 LAB — MAGNESIUM: Magnesium: 2.4 mg/dL (ref 1.7–2.4)

## 2021-12-02 MED ORDER — SODIUM CHLORIDE 0.9% FLUSH: 3.0000 mL | INTRAVENOUS | Status: DC | PRN

## 2021-12-02 MED ORDER — ASPIRIN 81 MG PO CHEW
81.0000 mg | CHEWABLE_TABLET | ORAL | Status: AC
  Administered 2021-12-03: 07:00:00 81 mg via ORAL
  Filled 2021-12-02: qty 1

## 2021-12-02 MED ORDER — SODIUM CHLORIDE 0.9 % WEIGHT BASED INFUSION
1.0000 mL/kg/h | INTRAVENOUS | Status: DC
  Administered 2021-12-03: 07:00:00 1 mL/kg/h via INTRAVENOUS

## 2021-12-02 MED ORDER — SODIUM CHLORIDE 0.9 % IV SOLN: 250.0000 mL | INTRAVENOUS | Status: DC | PRN

## 2021-12-02 MED ORDER — SODIUM CHLORIDE 0.9 % WEIGHT BASED INFUSION
3.0000 mL/kg/h | INTRAVENOUS | Status: AC
  Administered 2021-12-02: 3 mL/kg/h via INTRAVENOUS

## 2021-12-02 NOTE — Progress Notes (Signed)
Skin assessment:  Scar on Lateral Right Upper Arm. Patient states it was from a previous procedure due to skin cancer.  White/ silver scales on  Left Knee. Patient states Psoriasis.  Generalized itching but no redness overall.

## 2021-12-02 NOTE — Progress Notes (Signed)
°  Transition of Care [facility] Screening Note   Patient Details  Name: Charles Blake Date of Birth: 12-Aug-1956   Transition of Care [facility]) CM/SW Contact:    Alberteen Sam, LCSW Phone Number: 12/02/2021, 8:53 AM    Transition of Care Department [facility]) has reviewed patient and no TOC needs have been identified at this time. We will continue to monitor patient advancement through interdisciplinary progression rounds. If new patient transition needs arise, please place a TOC consult.

## 2021-12-02 NOTE — Progress Notes (Signed)
[place] for heparin infusion Indication: chest pain/ACS  Allergies  Allergen Reactions   Rosuvastatin     Other reaction(s): Other (See Comments) Extreme joint pain & fatigue     Patient Measurements: Height: 5\' 7"  (170.2 cm) Weight: 104.8 kg (231 lb 0.7 oz) IBW/kg (Calculated) : 66.1 Heparin Dosing Weight: 89.3 kg  Vital Signs: Temp: 97.6 F (36.4 C) (12/26 0349) BP: 167/78 (12/26 0349) Pulse Rate: 57 (12/26 0349)  Labs: Recent Labs    11/29/21 1802 11/29/21 2234 11/30/21 0103 11/30/21 0310 11/30/21 0454 11/30/21 0735 11/30/21 1644 12/01/21 0005 12/01/21 0642 12/02/21 0428  HGB  --   --   --   --  14.1  --   --   --  14.7 15.1  HCT  --   --   --   --  40.6  --   --   --  41.5 43.7  PLT  --   --   --   --  210  --   --   --  201 212  APTT 28  --   --   --   --   --   --   --   --   --   LABPROT 13.1  --   --   --   --   --   --   --   --   --   INR 1.0  --   --   --   --   --   --   --   --   --   HEPARINUNFRC  --   --  0.39  --   --    < > 0.33 0.40  --  0.39  CREATININE  --   --   --   --  1.01  --   --   --  0.88 1.10  TROPONINIHS  --    < > 1,510* 1,130* 944*  --   --   --   --   --    < > = values in this interval not displayed.     Estimated Creatinine Clearance: 76.2 mL/min (by C-G formula based on SCr of 1.1 mg/dL).   Medical History: Past Medical History:  Diagnosis Date   Anemia    Anxiety    Arthritis    fingers and hands   Cancer (HCC)    skin cancer   COPD (chronic obstructive pulmonary disease) (HCC)    wheezing   Coronary artery disease    Dyspnea    GERD (gastroesophageal reflux disease)    Headache    2-3 times/ week   History of hiatal hernia    HOH (hard of hearing)    Hypercholesteremia    Hypertension    controlled   Pneumonia    Sleep apnea    CPAP   Wears dentures    upper    Medications:  Per chart review, no anticoagulation noted prior to admission.   Assessment: 65  y.o. male who presents to the urgent care complaining of central substernal chest pain. Pharmacy has been consulted for heparin.   Date Time HL Rate/comment 12/24 0103 0.39 1100 un/hr, therapeutic 12/24  0735  <0.10 1100 un/hr, subtherapeutic 12/24 1644 0.33 1350 un/hr, therapeutic 12/25   0005    0.40     1350 un/hr, therapeutic  12/26   0428    0.39     1350 units/hr, therapeutic X 3  Goal of Therapy:  Heparin level 0.3-0.7 units/ml Monitor platelets by anticoagulation protocol: Yes   Plan:  12/26:  HL @ 0428 = 0.39, therapeutic X 3  Will continue pt on current rate and recheck HL .   Mekala Winger D, PharmD  12/02/2021,6:41 AM

## 2021-12-02 NOTE — Progress Notes (Signed)
PROGRESS NOTE    Charles Blake  [facility]:[id number] DOB: [birth year] DOA: 11/29/2021 PCP: Sofie Hartigan, MD  [id number]   Assessment & Plan:   Principal Problem:   NSTEMI (non-ST elevated myocardial infarction) [facility])   Charles Blake is a 65 y.o. male with medical history significant of carotid stenosis s/p stent, PAD s/p stent, HTN, COPD, OSA on CPAP, known LBBB who presented with chest pain.    # NSTEMI --Trop 22 and peaked at 1500.  Chest pain resolved with nitro paste.   --started on heparin gtt Plan: --cont heparin gtt --cont ASA 81 mg daily --cont statin --heart cath on Tuesday   # HTN --BP elevated on presentation, improved now. --on home amlodipine, coreg, lasix, irbesartan and HCTZ, and Imdur --cont home regimen   # Carotid stenosis s/p stent on the left # PAD s/p stent --cont home Plavix   # COPD --stable. --cont home Anoro   # OSA on CPAP --cont CPAP nightly   # HLD --cont statin  # GERD --cont PPI   DVT prophylaxis: MO:QHUTMLY gtt Code Status: Full code  Family Communication:  Level of care: Progressive Dispo:   The patient is from: home Anticipated d/c is to: home Anticipated d/c date is: 1-2 days Patient currently is not medically ready to d/c due to: pending heart cath   Subjective and Interval History:  No chest pain.  No complaint.  Eating well.   Objective: Vitals:   12/02/21 0800 12/02/21 1459 12/02/21 1853 12/02/21 2000  BP:  122/61 (!) 141/64   Pulse:  68 73   Resp:  18 18 19   Temp:  99.3 F (37.4 C) 98 F (36.7 C)   TempSrc:   Oral   SpO2: 97% 97% 97%   Weight:      Height:        Intake/Output Summary (Last 24 hours) at 12/02/2021 2054 Last data filed at 12/02/2021 2000 Gross per 24 hour  Intake 1974.11 ml  Output 1200 ml  Net 774.11 ml   Filed Weights   11/29/21 1343  Weight: 104.8 kg    Examination:   Constitutional: NAD, AAOx3 HEENT: conjunctivae and lids normal, EOMI CV: No cyanosis.    RESP: normal respiratory effort, on RA Extremities: No effusions, edema in BLE SKIN: warm, dry Neuro: II - XII grossly intact.   Psych: Normal mood and affect.  Appropriate judgement and reason   Data Reviewed: I have personally reviewed following labs and imaging studies  CBC: Recent Labs  Lab 11/29/21 1350 11/30/21 0454 12/01/21 0642 12/02/21 0428  WBC 10.3 10.1 8.2 10.0  HGB 14.9 14.1 14.7 15.1  HCT 42.6 40.6 41.5 43.7  MCV 93.0 93.5 92.8 92.6  PLT 225 210 201 650   Basic Metabolic Panel: Recent Labs  Lab 11/29/21 1350 11/30/21 0454 12/01/21 0642 12/02/21 0428  NA 137 135 135 135  K 3.9 3.9 4.0 4.0  CL 106 104 104 104  CO2 26 27 25 24   GLUCOSE 117* 123* 126* 132*  BUN 15 15 15 20   CREATININE 1.10 1.01 0.88 1.10  CALCIUM 8.9 8.8* 8.6* 8.6*  MG  --  2.1 2.3 2.4   GFR: Estimated Creatinine Clearance: 76.2 mL/min (by C-G formula based on SCr of 1.1 mg/dL). Liver Function Tests: No results for input(s): AST, ALT, ALKPHOS, BILITOT, PROT, ALBUMIN in the last 168 hours. No results for input(s): LIPASE, AMYLASE in the last 168 hours. No results for input(s): AMMONIA in the last 168 hours. Coagulation  Profile: Recent Labs  Lab 11/29/21 1802  INR 1.0   Cardiac Enzymes: No results for input(s): CKTOTAL, CKMB, CKMBINDEX, TROPONINI in the last 168 hours. BNP (last 3 results) No results for input(s): PROBNP in the last 8760 hours. HbA1C: No results for input(s): HGBA1C in the last 72 hours. CBG: No results for input(s): GLUCAP in the last 168 hours. Lipid Profile: No results for input(s): CHOL, HDL, LDLCALC, TRIG, CHOLHDL, LDLDIRECT in the last 72 hours. Thyroid Function Tests: No results for input(s): TSH, T4TOTAL, FREET4, T3FREE, THYROIDAB in the last 72 hours. Anemia Panel: No results for input(s): VITAMINB12, FOLATE, FERRITIN, TIBC, IRON, RETICCTPCT in the last 72 hours. Sepsis Labs: No results for input(s): PROCALCITON, LATICACIDVEN in the last 168  hours.  Recent Results (from the past 240 hour(s))  Resp Panel by RT-PCR (Flu A&B, Covid) Nasopharyngeal Swab     Status: None   Collection Time: 11/29/21  5:16 PM   Specimen: Nasopharyngeal Swab; Nasopharyngeal(NP) swabs in vial transport medium  Result Value Ref Range Status   SARS Coronavirus 2 by RT PCR NEGATIVE NEGATIVE Final    Comment: (NOTE) SARS-CoV-2 target nucleic acids are NOT DETECTED.  The SARS-CoV-2 RNA is generally detectable in upper respiratory specimens during the acute phase of infection. The lowest concentration of SARS-CoV-2 viral copies this assay can detect is 138 copies/mL. A negative result does not preclude SARS-Cov-2 infection and should not be used as the sole basis for treatment or other patient management decisions. A negative result may occur with  improper specimen collection/handling, submission of specimen other than nasopharyngeal swab, presence of viral mutation(s) within the areas targeted by this assay, and inadequate number of viral copies(<138 copies/mL). A negative result must be combined with clinical observations, patient history, and epidemiological information. The expected result is Negative.  Fact Sheet for Patients:  EntrepreneurPulse.com.au  Fact Sheet for Healthcare Providers:  IncredibleEmployment.be  This test is no t yet approved or cleared by the [place] FDA and  has been authorized for detection and/or diagnosis of SARS-CoV-2 by FDA under an Emergency Use Authorization (EUA). This EUA will remain  in effect (meaning this test can be used) for the duration of the COVID-19 declaration under Section 564(b)(1) of the Act, 21 U.S.C.section 360bbb-3(b)(1), unless the authorization is terminated  or revoked sooner.       Influenza A by PCR NEGATIVE NEGATIVE Final   Influenza B by PCR NEGATIVE NEGATIVE Final    Comment: (NOTE) The Xpert Xpress SARS-CoV-2/FLU/RSV plus assay is intended  as an aid in the diagnosis of influenza from Nasopharyngeal swab specimens and should not be used as a sole basis for treatment. Nasal washings and aspirates are unacceptable for Xpert Xpress SARS-CoV-2/FLU/RSV testing.  Fact Sheet for Patients: EntrepreneurPulse.com.au  Fact Sheet for Healthcare Providers: IncredibleEmployment.be  This test is not yet approved or cleared by the [place] FDA and has been authorized for detection and/or diagnosis of SARS-CoV-2 by FDA under an Emergency Use Authorization (EUA). This EUA will remain in effect (meaning this test can be used) for the duration of the COVID-19 declaration under Section 564(b)(1) of the Act, 21 U.S.C. section 360bbb-3(b)(1), unless the authorization is terminated or revoked.  Performed at [facility], [address] [place], [place] [address]   MRSA Next Gen by PCR, Nasal     Status: Abnormal   Collection Time: 12/01/21  6:00 PM   Specimen: Nasal Mucosa; Nasal Swab  Result Value Ref Range Status   MRSA by PCR  Next Gen DETECTED (A) NOT DETECTED Final    Comment: RESULT CALLED TO, READ BACK BY AND VERIFIED WITH: BRENDA PATE 2059 12/01/21 MU (NOTE) The GeneXpert MRSA Assay (FDA approved for NASAL specimens only), is one component of a comprehensive MRSA colonization surveillance program. It is not intended to diagnose MRSA infection nor to guide or monitor treatment for MRSA infections. Test performance is not FDA approved in patients less than 66 years old. Performed at [facility], [address] [place], [place] [address]       Radiology Studies: No results found.   Scheduled Meds:  amLODipine  5 mg Oral Daily   [START ON 12/03/2021] aspirin  81 mg Oral Pre-Cath   aspirin EC  81 mg Oral Daily   carvedilol  6.25 mg Oral BID WC   clopidogrel  75 mg Oral QPM   escitalopram  10 mg Oral q AM   furosemide  20 mg Oral Daily   irbesartan  300 mg  Oral Daily   And   hydrochlorothiazide  12.5 mg Oral Daily   isosorbide mononitrate  60 mg Oral Daily   melatonin  2.5 mg Oral QHS   mupirocin ointment  1 application Nasal BID   pantoprazole  40 mg Oral Daily   pravastatin  40 mg Oral q1800   sodium chloride flush  3 mL Intravenous Q12H   umeclidinium-vilanterol  1 puff Inhalation Daily   Continuous Infusions:  sodium chloride     [START ON 12/03/2021] sodium chloride     Followed by   Derrill Memo ON 12/03/2021] sodium chloride     heparin 1,350 Units/hr (12/02/21 2000)     LOS: 3 days     Enzo Bi, MD [facility] If 7PM-7AM, please contact night-coverage 12/02/2021, 8:54 PM

## 2021-12-02 NOTE — Progress Notes (Signed)
Informed consent signed. Placed in patient's folder.  Right groin clipped. CHG bath completed

## 2021-12-02 NOTE — Care Management Important Message (Signed)
Important Message  Patient Details  Name: Charles Blake MRN: [id number] Date of Birth: [birth year]   Medicare Important Message Given:  Yes     Dannette Barbara 12/02/2021, 3:48 PM

## 2021-12-02 NOTE — Progress Notes (Signed)
[facility]    SUBJECTIVE: Denies any chest pain.  Resting comfortably no shortness of breath no palpitation tachycardia prepared for cardiac cath tomorrow   Vitals:   12/01/21 1909 12/01/21 2323 12/02/21 0349 12/02/21 0724  BP: (!) 150/68 (!) 144/82 (!) 167/78 (!) 164/69  Pulse: 66 68 (!) 57 66  Resp: 18 18 20 18   Temp: 98.8 F (37.1 C) 98.1 F (36.7 C) 97.6 F (36.4 C) 97.9 F (36.6 C)  TempSrc:      SpO2: 94% 97% 99% 96%  Weight:      Height:         Intake/Output Summary (Last 24 hours) at 12/02/2021 0802 Last data filed at 12/02/2021 2297 Gross per 24 hour  Intake 580 ml  Output 1400 ml  Net -820 ml      PHYSICAL EXAM  General: Well developed, well nourished, in no acute distress HEENT:  Normocephalic and atramatic Neck:  No JVD.  Lungs: Clear bilaterally to auscultation and percussion. Heart: HRRR . Normal S1 and S2 without gallops or murmurs.  Abdomen: Bowel sounds are positive, abdomen soft and non-tender  Msk:  Back normal, normal gait. Normal strength and tone for age. Extremities: No clubbing, cyanosis or edema.   Neuro: Alert and oriented X 3. Psych:  Good affect, responds appropriately   LABS: Basic Metabolic Panel: Recent Labs    12/01/21 0642 12/02/21 0428  NA 135 135  K 4.0 4.0  CL 104 104  CO2 25 24  GLUCOSE 126* 132*  BUN 15 20  CREATININE 0.88 1.10  CALCIUM 8.6* 8.6*  MG 2.3 2.4   Liver Function Tests: No results for input(s): AST, ALT, ALKPHOS, BILITOT, PROT, ALBUMIN in the last 72 hours. No results for input(s): LIPASE, AMYLASE in the last 72 hours. CBC: Recent Labs    12/01/21 0642 12/02/21 0428  WBC 8.2 10.0  HGB 14.7 15.1  HCT 41.5 43.7  MCV 92.8 92.6  PLT 201 212   Cardiac Enzymes: No results for input(s): CKTOTAL, CKMB, CKMBINDEX, TROPONINI in the last 72 hours. BNP: Invalid input(s): POCBNP D-Dimer: No results for input(s): DDIMER in the last 72 hours. Hemoglobin A1C: No results for input(s): HGBA1C in  the last 72 hours. Fasting Lipid Panel: No results for input(s): CHOL, HDL, LDLCALC, TRIG, CHOLHDL, LDLDIRECT in the last 72 hours. Thyroid Function Tests: No results for input(s): TSH, T4TOTAL, T3FREE, THYROIDAB in the last 72 hours.  Invalid input(s): FREET3 Anemia Panel: No results for input(s): VITAMINB12, FOLATE, FERRITIN, TIBC, IRON, RETICCTPCT in the last 72 hours.  ECHOCARDIOGRAM COMPLETE  Result Date: 11/30/2021    ECHOCARDIOGRAM REPORT   Patient Name:   GRACIN MCPARTLAND Date of Exam: 11/30/2021 Medical Rec #:  [id number]         Height:       67.0 in Accession #:    [id number]        Weight:       231.0 lb Date of Birth:  Aug 31, 1956          BSA:          2.150 m Patient Age:    65 years          BP:           130/58 mmHg Patient Gender: M                 HR:           68 bpm. Exam Location:  [facility] Procedure: 2D Echo  and Intracardiac Opacification Agent Indications:     NSTEMI I21.4  History:         Patient has no prior history of Echocardiogram examinations.  Sonographer:     Kathlen Brunswick RDCS Referring Phys:  [id number] [place] Diagnosing Phys: Yolonda Kida MD  Sonographer Comments: Technically difficult study due to poor echo windows. Image acquisition challenging due to patient body habitus. IMPRESSIONS  1. Left ventricular ejection fraction, by estimation, is 50 to 55%. The left ventricle has low normal function. The left ventricle has no regional wall motion abnormalities. The left ventricular internal cavity size was mildly dilated. Left ventricular diastolic parameters are consistent with Grade I diastolic dysfunction (impaired relaxation).  2. Right ventricular systolic function is normal. The right ventricular size is normal.  3. The mitral valve is normal in structure. No evidence of mitral valve regurgitation.  4. The aortic valve is grossly normal. Aortic valve regurgitation is not visualized. FINDINGS  Left Ventricle: Left ventricular ejection fraction, by estimation, is 50  to 55%. The left ventricle has low normal function. The left ventricle has no regional wall motion abnormalities. Definity contrast agent was given IV to delineate the left ventricular endocardial borders. The left ventricular internal cavity size was mildly dilated. There is borderline asymmetric left ventricular hypertrophy of the septal segment. Left ventricular diastolic parameters are consistent with Grade I diastolic dysfunction (impaired relaxation). Right Ventricle: The right ventricular size is normal. No increase in right ventricular wall thickness. Right ventricular systolic function is normal. Left Atrium: Left atrial size was normal in size. Right Atrium: Right atrial size was normal in size. Pericardium: There is no evidence of pericardial effusion. Mitral Valve: The mitral valve is normal in structure. No evidence of mitral valve regurgitation. Tricuspid Valve: The tricuspid valve is normal in structure. Tricuspid valve regurgitation is trivial. Aortic Valve: The aortic valve is grossly normal. Aortic valve regurgitation is not visualized. Aortic valve peak gradient measures 6.4 mmHg. Pulmonic Valve: The pulmonic valve was normal in structure. Pulmonic valve regurgitation is not visualized. Aorta: The ascending aorta was not well visualized. IAS/Shunts: No atrial level shunt detected by color flow Doppler.  LEFT VENTRICLE PLAX 2D LVIDd:         4.90 cm   Diastology LVIDs:         3.60 cm   LV e' medial:    3.92 cm/s LV PW:         1.10 cm   LV E/e' medial:  9.8 LV IVS:        1.40 cm   LV e' lateral:   4.68 cm/s LVOT diam:     2.00 cm   LV E/e' lateral: 8.2 LV SV:         58 LV SV Index:   27 LVOT Area:     3.14 cm  RIGHT VENTRICLE RV Basal diam:  3.00 cm RV S prime:     14.80 cm/s TAPSE (M-mode): 2.2 cm LEFT ATRIUM             Index        RIGHT ATRIUM           Index LA diam:        4.00 cm 1.86 cm/m   RA Area:     12.90 cm LA Vol (A2C):   38.6 ml 17.95 ml/m  RA Volume:   32.10 ml  14.93 ml/m  LA Vol (A4C):   40.0 ml 18.60 ml/m LA Biplane Vol:  39.8 ml 18.51 ml/m  AORTIC VALVE                 PULMONIC VALVE AV Area (Vmax): 2.03 cm     PV Vmax:       0.90 m/s AV Vmax:        126.00 cm/s  PV Peak grad:  3.2 mmHg AV Peak Grad:   6.4 mmHg LVOT Vmax:      81.60 cm/s LVOT Vmean:     61.300 cm/s LVOT VTI:       0.185 m  AORTA Ao Root diam: 3.20 cm Ao Asc diam:  3.00 cm MITRAL VALVE MV Area (PHT): 3.12 cm    SHUNTS MV Decel Time: 243 msec    Systemic VTI:  0.18 m MV E velocity: 38.60 cm/s  Systemic Diam: 2.00 cm MV A velocity: 67.30 cm/s MV E/A ratio:  0.57 Jaisha Villacres D Sonu Kruckenberg MD Electronically signed by Yolonda Kida MD Signature Date/Time: 11/30/2021/5:25:30 PM    Final      Echo mildly depressed left ventricular function at 50 to 55%  TELEMETRY: Normal sinus rhythm rate of 70:  ASSESSMENT AND PLAN:  Principal Problem:   NSTEMI (non-ST elevated myocardial infarction) ([place]) Unstable angina Coronary artery disease Hyperlipidemia Obesity Hypertension Obstructive sleep apnea Abnormal EKG Peripheral vascular disease COPD Smoking  Plan Agree with admit to progressive care follow-up EKGs and troponins Echocardiogram for assessment left ventricular function Weight loss exercise portion control Maintain IV heparin for anticoagulation Inhalers as necessary for COPD Sleep study CPAP weight loss for obstructive sleep apnea Advised patient to refrain from tobacco abuse   Yolonda Kida, MD 12/02/2021 8:02 AM

## 2021-12-03 ENCOUNTER — Ambulatory Visit (HOSPITAL_COMMUNITY)
Admission: AD | Admit: 2021-12-03 | Discharge: 2021-12-03 | Disposition: A | Discharge status: Home / Self Care | Payer: Medicare Other | Source: Other Acute Inpatient Hospital | Attending: Hospitalist | Admitting: Hospitalist

## 2021-12-03 ENCOUNTER — Encounter
Admission: EM | Disposition: A | Discharge status: Other Acute Inpatient Hospital | Payer: Self-pay | Source: Home / Self Care | Attending: Hospitalist

## 2021-12-03 DIAGNOSIS — F17200 Nicotine dependence, unspecified, uncomplicated: Secondary | ICD-10-CM | POA: Insufficient documentation

## 2021-12-03 DIAGNOSIS — I214 Non-ST elevation (NSTEMI) myocardial infarction: Secondary | ICD-10-CM | POA: Insufficient documentation

## 2021-12-03 HISTORY — PX: LEFT HEART CATH AND CORONARY ANGIOGRAPHY: CATH118249

## 2021-12-03 LAB — CBC
HCT: 39.2 % (ref 39.0–52.0)
Hemoglobin: 13.8 g/dL (ref 13.0–17.0)
MCH: 32.4 pg (ref 26.0–34.0)
MCHC: 35.2 g/dL (ref 30.0–36.0)
MCV: 92 fL (ref 80.0–100.0)
Platelets: 204 10*3/uL (ref 150–400)
RBC: 4.26 MIL/uL (ref 4.22–5.81)
RDW: 12.3 % (ref 11.5–15.5)
WBC: 9.5 10*3/uL (ref 4.0–10.5)
nRBC: 0 % (ref 0.0–0.2)

## 2021-12-03 LAB — BASIC METABOLIC PANEL
Anion gap: 8 (ref 5–15)
BUN: 20 mg/dL (ref 8–23)
CO2: 24 mmol/L (ref 22–32)
Calcium: 8.6 mg/dL — ABNORMAL LOW (ref 8.9–10.3)
Chloride: 105 mmol/L (ref 98–111)
Creatinine, Ser: 0.99 mg/dL (ref 0.61–1.24)
GFR, Estimated: >60 mL/min (ref >60)
Glucose, Bld: 125 mg/dL — ABNORMAL HIGH (ref 70–99)
Potassium: 3.8 mmol/L (ref 3.5–5.1)
Sodium: 137 mmol/L (ref 135–145)

## 2021-12-03 LAB — MAGNESIUM: Magnesium: 2.2 mg/dL (ref 1.7–2.4)

## 2021-12-03 LAB — GLUCOSE, CAPILLARY: Glucose-Capillary: 120 mg/dL — ABNORMAL HIGH (ref 70–99)

## 2021-12-03 LAB — HEPARIN LEVEL (UNFRACTIONATED): Heparin Unfractionated: 0.29 IU/mL — ABNORMAL LOW (ref 0.30–0.70)

## 2021-12-03 SURGERY — LEFT HEART CATH AND CORONARY ANGIOGRAPHY
Anesthesia: Moderate Sedation

## 2021-12-03 MED ORDER — NITROGLYCERIN 0.4 MG SL SUBL
0.4000 mg | SUBLINGUAL_TABLET | SUBLINGUAL | Status: DC | PRN
  Administered 2021-12-03 (×2): 0.4 mg via SUBLINGUAL
  Filled 2021-12-03: qty 1

## 2021-12-03 MED ORDER — HEPARIN (PORCINE) IN NACL 1000-0.9 UT/500ML-% IV SOLN
INTRAVENOUS | Status: DC | PRN
  Administered 2021-12-03 (×2): 500 mL

## 2021-12-03 MED ORDER — MIDAZOLAM HCL 2 MG/2ML IJ SOLN
INTRAMUSCULAR | Status: DC | PRN
  Administered 2021-12-03: 1 mg via INTRAVENOUS

## 2021-12-03 MED ORDER — NITROGLYCERIN 0.4 MG SL SUBL
0.4000 mg | SUBLINGUAL_TABLET | SUBLINGUAL | Status: AC | PRN
Start: 2021-12-03 — End: ?

## 2021-12-03 MED ORDER — HEPARIN (PORCINE) 25000 UT/250ML-% IV SOLN: 1500.0000 [IU]/h | INTRAVENOUS | Status: DC

## 2021-12-03 MED ORDER — SODIUM CHLORIDE 0.9 % IV SOLN: INTRAVENOUS | Status: DC

## 2021-12-03 MED ORDER — CHLORHEXIDINE GLUCONATE CLOTH 2 % EX PADS
6.0000 | MEDICATED_PAD | Freq: Every day | CUTANEOUS | Status: DC
  Administered 2021-12-03: 21:00:00 6 via TOPICAL

## 2021-12-03 MED ORDER — NITROGLYCERIN 0.4 MG SL SUBL
0.4000 mg | SUBLINGUAL_TABLET | SUBLINGUAL | Status: AC | PRN
  Administered 2021-12-03 (×3): 0.4 mg via SUBLINGUAL

## 2021-12-03 MED ORDER — IOHEXOL 300 MG/ML  SOLN
INTRAMUSCULAR | Status: DC | PRN
  Administered 2021-12-03: 13:00:00 110 mL

## 2021-12-03 MED ORDER — VERAPAMIL HCL 2.5 MG/ML IV SOLN
INTRAVENOUS | Status: DC | PRN
  Administered 2021-12-03: 2.5 mg via INTRAVENOUS

## 2021-12-03 MED ORDER — HEPARIN (PORCINE) IN NACL 1000-0.9 UT/500ML-% IV SOLN
INTRAVENOUS | Status: AC
  Filled 2021-12-03: qty 1000

## 2021-12-03 MED ORDER — HEPARIN SODIUM (PORCINE) 1000 UNIT/ML IJ SOLN
INTRAMUSCULAR | Status: AC
  Filled 2021-12-03: qty 10

## 2021-12-03 MED ORDER — FENTANYL CITRATE (PF) 100 MCG/2ML IJ SOLN
INTRAMUSCULAR | Status: AC
  Filled 2021-12-03: qty 2

## 2021-12-03 MED ORDER — HYDRALAZINE HCL 20 MG/ML IJ SOLN: 10.0000 mg | INTRAMUSCULAR | Status: AC | PRN

## 2021-12-03 MED ORDER — ACETAMINOPHEN 325 MG PO TABS: 650.0000 mg | ORAL_TABLET | ORAL | Status: DC | PRN

## 2021-12-03 MED ORDER — HYDRALAZINE HCL 20 MG/ML IJ SOLN: 10.0000 mg | Freq: Four times a day (QID) | INTRAMUSCULAR | Status: DC | PRN

## 2021-12-03 MED ORDER — ASPIRIN 81 MG PO TBEC: 81.0000 mg | DELAYED_RELEASE_TABLET | Freq: Every day | ORAL | Status: DC

## 2021-12-03 MED ORDER — MIDAZOLAM HCL 2 MG/2ML IJ SOLN
INTRAMUSCULAR | Status: AC
  Filled 2021-12-03: qty 2

## 2021-12-03 MED ORDER — HEPARIN SODIUM (PORCINE) 1000 UNIT/ML IJ SOLN
INTRAMUSCULAR | Status: DC | PRN
  Administered 2021-12-03: 5000 [IU] via INTRAVENOUS

## 2021-12-03 MED ORDER — IOHEXOL 300 MG/ML  SOLN
INTRAMUSCULAR | Status: DC | PRN
  Administered 2021-12-03: 13:00:00 31 mL

## 2021-12-03 MED ORDER — ONDANSETRON HCL 4 MG/2ML IJ SOLN: 4.0000 mg | Freq: Four times a day (QID) | INTRAMUSCULAR | Status: DC | PRN

## 2021-12-03 MED ORDER — LABETALOL HCL 5 MG/ML IV SOLN: 10.0000 mg | INTRAVENOUS | Status: AC | PRN

## 2021-12-03 MED ORDER — FENTANYL CITRATE (PF) 100 MCG/2ML IJ SOLN
INTRAMUSCULAR | Status: DC | PRN
  Administered 2021-12-03: 50 ug via INTRAVENOUS

## 2021-12-03 MED ORDER — SODIUM CHLORIDE 0.9 % WEIGHT BASED INFUSION
1.0000 mL/kg/h | INTRAVENOUS | Status: DC
  Administered 2021-12-03: 17:00:00 1 mL/kg/h via INTRAVENOUS

## 2021-12-03 MED ORDER — VERAPAMIL HCL 2.5 MG/ML IV SOLN
INTRAVENOUS | Status: AC
  Filled 2021-12-03: qty 2

## 2021-12-03 SURGICAL SUPPLY — 15 items
CATH 5FR JL3.5 JR4 ANG PIG MP (CATHETERS) ×1 IMPLANT
CATH LAUNCHER 6FR EBU3.5 (CATHETERS) ×1 IMPLANT
DEVICE RAD COMP TR BAND LRG (VASCULAR PRODUCTS) ×1 IMPLANT
DRAPE BRACHIAL (DRAPES) ×1 IMPLANT
GLIDESHEATH SLEND SS 6F .021 (SHEATH) ×1 IMPLANT
GUIDEWIRE INQWIRE 1.5J.035X260 (WIRE) IMPLANT
GUIDEWIRE PRESS OMNI 185 ST (WIRE) IMPLANT
INQWIRE 1.5J .035X260CM (WIRE) ×2
KIT ENCORE 26 ADVANTAGE (KITS) ×1 IMPLANT
PACK CARDIAC CATH (CUSTOM PROCEDURE TRAY) ×2 IMPLANT
PROTECTION STATION PRESSURIZED (MISCELLANEOUS) ×2
SET ATX SIMPLICITY (MISCELLANEOUS) ×1 IMPLANT
STATION PROTECTION PRESSURIZED (MISCELLANEOUS) IMPLANT
TUBING CIL FLEX 10 FLL-RA (TUBING) ×1 IMPLANT
WIRE HITORQ VERSACORE ST 145CM (WIRE) ×1 IMPLANT

## 2021-12-03 NOTE — Progress Notes (Signed)
Patient was up using restroom. Now c/o 7/10 chest pain again.. Scheduled imdur, coreg and lexapro was given. Made Dr Nehemiah Massed aware of recurrent chest pain. Also made Md aware patient has nothing prn that we can give him for the chest pain.

## 2021-12-03 NOTE — Progress Notes (Signed)
[facility] Encounter Note  Patient: Charles Blake / Admit Date: 11/29/2021 / Date of Encounter: 12/03/2021, 2:16 PM   Subjective: Patient has done well overnight no evidence of significant worsening symptoms but did had intermittent chest pain when moving around and doing other activities.  No evidence of congestive heart failure  Cardiac catheterization showing normal LV systolic function with ejection fraction of 60% Critical proximal right coronary atherosclerosis of 90% Critical left main coronary artery atherosclerosis of 80%  Review of Systems: Positive for: Shortness of breath chest pain Negative for: Vision change, hearing change, syncope, dizziness, nausea, vomiting,diarrhea, bloody stool, stomach pain, cough, congestion, diaphoresis, urinary frequency, urinary pain,skin lesions, skin rashes Others previously listed  Objective: Telemetry: Normal sinus rhythm Physical Exam: Blood pressure (!) 173/77, pulse 67, temperature 98.1 F (36.7 C), temperature source Oral, resp. rate 15, height 5\' 7"  (1.702 m), weight 104.8 kg, SpO2 97 %. Body mass index is 36.19 kg/m. General: Well developed, well nourished, in no acute distress. Head: Normocephalic, atraumatic, sclera non-icteric, no xanthomas, nares are without discharge. Neck: No apparent masses Lungs: Normal respirations with no wheezes, no rhonchi, no rales , no crackles   Heart: Regular rate and rhythm, normal S1 S2, no murmur, no rub, no gallop, PMI is normal size and placement, carotid upstroke normal without bruit, jugular venous pressure normal Abdomen: Soft, non-tender, non-distended with normoactive bowel sounds. No hepatosplenomegaly. Abdominal aorta is normal size without bruit Extremities: No edema, no clubbing, no cyanosis, no ulcers,  Peripheral: 2+ radial, 2+ femoral, 2+ dorsal pedal pulses Neuro: Alert and oriented. Moves all extremities spontaneously. Psych:  Responds to questions  appropriately with a normal affect.   Intake/Output Summary (Last 24 hours) at 12/03/2021 1416 Last data filed at 12/03/2021 1333 Gross per 24 hour  Intake 2612.7 ml  Output 2350 ml  Net 262.7 ml    Inpatient Medications:   [MAR Hold] amLODipine  5 mg Oral Daily   [MAR Hold] aspirin EC  81 mg Oral Daily   [MAR Hold] carvedilol  6.25 mg Oral BID WC   [MAR Hold] clopidogrel  75 mg Oral QPM   [MAR Hold] escitalopram  10 mg Oral q AM   [MAR Hold] furosemide  20 mg Oral Daily   [MAR Hold] irbesartan  300 mg Oral Daily   And   [MAR Hold] hydrochlorothiazide  12.5 mg Oral Daily   [MAR Hold] isosorbide mononitrate  60 mg Oral Daily   [MAR Hold] melatonin  2.5 mg Oral QHS   [MAR Hold] mupirocin ointment  1 application Nasal BID   [MAR Hold] pantoprazole  40 mg Oral Daily   [MAR Hold] pravastatin  40 mg Oral q1800   [MAR Hold] sodium chloride flush  3 mL Intravenous Q12H   [MAR Hold] umeclidinium-vilanterol  1 puff Inhalation Daily   Infusions:   sodium chloride     sodium chloride     sodium chloride 1 mL/kg/hr (12/03/21 0729)   heparin 1,500 Units/hr (12/03/21 0555)    Labs: Recent Labs    12/02/21 0428 12/03/21 0416  NA 135 137  K 4.0 3.8  CL 104 105  CO2 24 24  GLUCOSE 132* 125*  BUN 20 20  CREATININE 1.10 0.99  CALCIUM 8.6* 8.6*  MG 2.4 2.2   No results for input(s): AST, ALT, ALKPHOS, BILITOT, PROT, ALBUMIN in the last 72 hours. Recent Labs    12/02/21 0428 12/03/21 0416  WBC 10.0 9.5  HGB 15.1 13.8  HCT 43.7  39.2  MCV 92.6 92.0  PLT 212 204   No results for input(s): CKTOTAL, CKMB, TROPONINI in the last 72 hours. Invalid input(s): POCBNP No results for input(s): HGBA1C in the last 72 hours.   Weights: Filed Weights   11/29/21 1343  Weight: 104.8 kg     Radiology/Studies:  DG Chest 2 View  Result Date: 11/29/2021 CLINICAL DATA:  Chest pain.  COPD. EXAM: CHEST - 2 VIEW COMPARISON:  08/03/2020 FINDINGS: The heart size and mediastinal contours are  within normal limits. Low lung volumes are noted. Both lungs are clear. No evidence of pleural effusion. The visualized skeletal structures are unremarkable. IMPRESSION: Low lung volumes. No active cardiopulmonary disease. Electronically Signed   By: Marlaine Hind M.D.   On: 11/29/2021 14:19   ECHOCARDIOGRAM COMPLETE  Result Date: 11/30/2021    ECHOCARDIOGRAM REPORT   Patient Name:   Charles Blake Date of Exam: 11/30/2021 Medical Rec #:  [id number]         Height:       67.0 in Accession #:    [id number]        Weight:       231.0 lb Date of Birth:  02/27/1956          BSA:          2.150 m Patient Age:    9 years          BP:           130/58 mmHg Patient Gender: M                 HR:           68 bpm. Exam Location:  [facility] Procedure: 2D Echo and Intracardiac Opacification Agent Indications:     NSTEMI I21.4  History:         Patient has no prior history of Echocardiogram examinations.  Sonographer:     Kathlen Brunswick RDCS Referring Phys:  [id number] [place] Diagnosing Phys: Yolonda Kida MD  Sonographer Comments: Technically difficult study due to poor echo windows. Image acquisition challenging due to patient body habitus. IMPRESSIONS  1. Left ventricular ejection fraction, by estimation, is 50 to 55%. The left ventricle has low normal function. The left ventricle has no regional wall motion abnormalities. The left ventricular internal cavity size was mildly dilated. Left ventricular diastolic parameters are consistent with Grade I diastolic dysfunction (impaired relaxation).  2. Right ventricular systolic function is normal. The right ventricular size is normal.  3. The mitral valve is normal in structure. No evidence of mitral valve regurgitation.  4. The aortic valve is grossly normal. Aortic valve regurgitation is not visualized. FINDINGS  Left Ventricle: Left ventricular ejection fraction, by estimation, is 50 to 55%. The left ventricle has low normal function. The left ventricle has no regional  wall motion abnormalities. Definity contrast agent was given IV to delineate the left ventricular endocardial borders. The left ventricular internal cavity size was mildly dilated. There is borderline asymmetric left ventricular hypertrophy of the septal segment. Left ventricular diastolic parameters are consistent with Grade I diastolic dysfunction (impaired relaxation). Right Ventricle: The right ventricular size is normal. No increase in right ventricular wall thickness. Right ventricular systolic function is normal. Left Atrium: Left atrial size was normal in size. Right Atrium: Right atrial size was normal in size. Pericardium: There is no evidence of pericardial effusion. Mitral Valve: The mitral valve is normal in structure. No evidence of mitral valve regurgitation. Tricuspid Valve:  The tricuspid valve is normal in structure. Tricuspid valve regurgitation is trivial. Aortic Valve: The aortic valve is grossly normal. Aortic valve regurgitation is not visualized. Aortic valve peak gradient measures 6.4 mmHg. Pulmonic Valve: The pulmonic valve was normal in structure. Pulmonic valve regurgitation is not visualized. Aorta: The ascending aorta was not well visualized. IAS/Shunts: No atrial level shunt detected by color flow Doppler.  LEFT VENTRICLE PLAX 2D LVIDd:         4.90 cm   Diastology LVIDs:         3.60 cm   LV e' medial:    3.92 cm/s LV PW:         1.10 cm   LV E/e' medial:  9.8 LV IVS:        1.40 cm   LV e' lateral:   4.68 cm/s LVOT diam:     2.00 cm   LV E/e' lateral: 8.2 LV SV:         58 LV SV Index:   27 LVOT Area:     3.14 cm  RIGHT VENTRICLE RV Basal diam:  3.00 cm RV S prime:     14.80 cm/s TAPSE (M-mode): 2.2 cm LEFT ATRIUM             Index        RIGHT ATRIUM           Index LA diam:        4.00 cm 1.86 cm/m   RA Area:     12.90 cm LA Vol (A2C):   38.6 ml 17.95 ml/m  RA Volume:   32.10 ml  14.93 ml/m LA Vol (A4C):   40.0 ml 18.60 ml/m LA Biplane Vol: 39.8 ml 18.51 ml/m  AORTIC VALVE                  PULMONIC VALVE AV Area (Vmax): 2.03 cm     PV Vmax:       0.90 m/s AV Vmax:        126.00 cm/s  PV Peak grad:  3.2 mmHg AV Peak Grad:   6.4 mmHg LVOT Vmax:      81.60 cm/s LVOT Vmean:     61.300 cm/s LVOT VTI:       0.185 m  AORTA Ao Root diam: 3.20 cm Ao Asc diam:  3.00 cm MITRAL VALVE MV Area (PHT): 3.12 cm    SHUNTS MV Decel Time: 243 msec    Systemic VTI:  0.18 m MV E velocity: 38.60 cm/s  Systemic Diam: 2.00 cm MV A velocity: 67.30 cm/s MV E/A ratio:  0.57 Dwayne D Callwood MD Electronically signed by Yolonda Kida MD Signature Date/Time: 11/30/2021/5:25:30 PM    Final      Assessment and Recommendation  65 y.o. male with known coronary artery disease hypertension hyperlipidemia having non-ST elevation myocardial infarction and critical distal left main and right coronary artery stenosis causing symptoms needing further treatment options 1.  Continuation of aspirin and heparin for further risk reduction in cardiovascular event 2.  Continue hypertension control with beta-blocker and/or ACE inhibitor as able 3.  Reinstatement of high intensity cholesterol therapy 4.  Transfer for coronary artery bypass grafting.  The patient has had f transfer set up and will be transferred when bed available  Signed, Serafina Royals M.D. FACC

## 2021-12-03 NOTE — Progress Notes (Signed)
Patient up out of bed to restroom. C/o 8/10 chest pain. Prn nitro SL given. Vital signs taken, see flow sheets.

## 2021-12-03 NOTE — Progress Notes (Signed)
@  approx 2115, Pt transferred off unit by [facility] personnel to [facility]. Report given to [facility] RN and RN [place] at receiving unit. Pt to Rm# [id number]. Pt updated family via personal cell phone regarding transfer.

## 2021-12-03 NOTE — Discharge Summary (Signed)
Physician Discharge Summary   Charles Blake  male DOB: 1956/08/23  KCL:[id number]  PCP: Sofie Hartigan, MD  Admit date: 11/29/2021 Discharge date: 12/03/2021  Admitted From: home Disposition:  transfer to [facility] for CABG CODE STATUS: Full code  Discharge Instructions     AMB Referral to Cardiac Rehabilitation - Phase II   Complete by: As directed    Diagnosis: NSTEMI        Hospital Course:  For full details, please see H&P, progress notes, consult notes and ancillary notes.  Briefly,  Charles Blake is a 65 y.o. male with medical history significant of carotid stenosis s/p stent, PAD s/p stent, HTN, COPD, OSA on CPAP, known LBBB who presented with chest pain.    # NSTEMI # CAD --Trop 25 and peaked at 1500.  Chest pain resolved with nitro paste.   --started on heparin gtt and ASA 81 mg which will continue upon transfer to [facility] for further risk reduction in cardiovascular event.  Home plavix and statin continued.   --Heart cath on 12/27 found normal LV systolic function with ejection fraction of 60%, however, pt has  Critical proximal right coronary atherosclerosis of 90% Critical left main coronary artery atherosclerosis of 80%  --cardiology recommended transfer to [facility] for CABG.   # HTN --BP elevated on presentation, improved now. --on home amlodipine, coreg, lasix, irbesartan and HCTZ, and Imdur --cont home regimen   # Carotid stenosis s/p stent on the left # PAD s/p stent --cont home Plavix   # COPD --stable. --cont home Anoro   # OSA on CPAP --cont CPAP nightly   # HLD --cont statin   # GERD --cont PPI  # Acute urinary retention, not POA --while lying flat waiting for heart cath, pt couldn't void, so Foley placed with 800 ml urine return. --continue Foley for now.  Should remove for a voiding trial when pt is able to ambulate freely.   Discharge Diagnoses:  Principal Problem:   NSTEMI (non-ST elevated myocardial infarction)  ([place])   30 Day Unplanned Readmission Risk Score    Flowsheet Row ED to Hosp-Admission (Current) from 11/29/2021 in [facility]  30 Day Unplanned Readmission Risk Score (%) 15.68 Filed at 12/03/2021 2000       This score is the patient's risk of an unplanned readmission within 30 days of being discharged (0 -100%). The score is based on dignosis, age, lab data, medications, orders, and past utilization.   Low:  0-14.9   Medium: 15-21.9   High: 22-29.9   Extreme: 30 and above         Discharge Instructions:  Allergies as of 12/03/2021       Reactions   Rosuvastatin    Other reaction(s): Other (See Comments) Extreme joint pain & fatigue         Medication List     STOP taking these medications    cephALEXin 500 MG capsule Commonly known as: KEFLEX       TAKE these medications    acetaminophen 650 MG CR tablet Commonly known as: TYLENOL Take 650 mg by mouth every 8 (eight) hours as needed for pain.   albuterol 108 (90 Base) MCG/ACT inhaler Commonly known as: VENTOLIN HFA Inhale 2 puffs into the lungs every 4 (four) hours as needed for wheezing.   amLODipine 5 MG tablet Commonly known as: NORVASC Take 5 mg by mouth daily.   Anoro Ellipta 62.5-25 MCG/ACT Aepb Generic drug: umeclidinium-vilanterol Inhale 1 puff  into the lungs daily.   aspirin 81 MG EC tablet Take 1 tablet (81 mg total) by mouth daily. Swallow whole. Start taking on: December 04, 2021   carvedilol 6.25 MG tablet Commonly known as: COREG Take 6.25 mg by mouth 2 (two) times daily with a meal.   clopidogrel 75 MG tablet Commonly known as: PLAVIX Take 1 tablet by mouth once daily What changed: when to take this   escitalopram 10 MG tablet Commonly known as: LEXAPRO Take 10 mg by mouth in the morning.   furosemide 20 MG tablet Commonly known as: LASIX Take 20 mg by mouth daily.   heparin 25000 UT/250ML infusion Inject 1,500 Units/hr into the vein continuous.    isosorbide mononitrate 60 MG 24 hr tablet Commonly known as: IMDUR Take 60 mg by mouth daily.   lovastatin 40 MG tablet Commonly known as: MEVACOR Take 40 mg by mouth at bedtime.   Melatonin 3 MG Caps Take 3 mg by mouth at bedtime.   Multi-Vitamins Tabs Take 1 tablet by mouth daily.   niacin 500 MG tablet Commonly known as: SLO-NIACIN Take 500 mg by mouth at bedtime.   nitroGLYCERIN 0.4 MG SL tablet Commonly known as: NITROSTAT Place 1 tablet (0.4 mg total) under the tongue every 5 (five) minutes as needed for chest pain.   pantoprazole 40 MG tablet Commonly known as: PROTONIX Take 40 mg by mouth daily.   telmisartan-hydrochlorothiazide 80-12.5 MG tablet Commonly known as: MICARDIS HCT Take 1 tablet by mouth daily.          Allergies  Allergen Reactions   Rosuvastatin     Other reaction(s): Other (See Comments) Extreme joint pain & fatigue      The results of significant diagnostics from this hospitalization (including imaging, microbiology, ancillary and laboratory) are listed below for reference.   Consultations:   Procedures/Studies: DG Chest 2 View  Result Date: 11/29/2021 CLINICAL DATA:  Chest pain.  COPD. EXAM: CHEST - 2 VIEW COMPARISON:  08/03/2020 FINDINGS: The heart size and mediastinal contours are within normal limits. Low lung volumes are noted. Both lungs are clear. No evidence of pleural effusion. The visualized skeletal structures are unremarkable. IMPRESSION: Low lung volumes. No active cardiopulmonary disease. Electronically Signed   By: Marlaine Hind M.D.   On: 11/29/2021 14:19   CARDIAC CATHETERIZATION  Result Date: 12/03/2021   Prox RCA lesion is 90% stenosed.   Prox Cx lesion is 75% stenosed.   Mid LM lesion is 85% stenosed.   Ost LAD lesion is 60% stenosed.   The left ventricular systolic function is normal.   LV end diastolic pressure is normal.   The left ventricular ejection fraction is greater than 65% by visual estimate.  65 year old male with hypertension hyper kalemia family history of cardiovascular disease and intolerance to medical management for lipidemia having a non-ST elevation myocardial infarction Normal LV function with ejection fraction of 65% Critical distal left main coronary atherosclerosis of 85% with a critical stenosis of proximal right coronary artery and proximal circumflex artery After discussion with the cardiology team decision for bypass surgery was confirmed Plan Transfer for coronary artery bypass grafting due to severe left main coronary atherosclerosis and 2 other vessel coronary artery disease High intensity cholesterol therapy Beta-blocker for hypertension control Nitrates for chest pain Continue heparin intravenously   ECHOCARDIOGRAM COMPLETE  Result Date: 11/30/2021    ECHOCARDIOGRAM REPORT   Patient Name:   Charles Blake Date of Exam: 11/30/2021 Medical Rec #:  [id number]  Height:       67.0 in Accession #:    [id number]        Weight:       231.0 lb Date of Birth:  May 11, 1956          BSA:          2.150 m Patient Age:    84 years          BP:           130/58 mmHg Patient Gender: M                 HR:           68 bpm. Exam Location:  [facility] Procedure: 2D Echo and Intracardiac Opacification Agent Indications:     NSTEMI I21.4  History:         Patient has no prior history of Echocardiogram examinations.  Sonographer:     Kathlen Brunswick RDCS Referring Phys:  [id number] [place] Diagnosing Phys: Yolonda Kida MD  Sonographer Comments: Technically difficult study due to poor echo windows. Image acquisition challenging due to patient body habitus. IMPRESSIONS  1. Left ventricular ejection fraction, by estimation, is 50 to 55%. The left ventricle has low normal function. The left ventricle has no regional wall motion abnormalities. The left ventricular internal cavity size was mildly dilated. Left ventricular diastolic parameters are consistent with Grade I diastolic dysfunction (impaired  relaxation).  2. Right ventricular systolic function is normal. The right ventricular size is normal.  3. The mitral valve is normal in structure. No evidence of mitral valve regurgitation.  4. The aortic valve is grossly normal. Aortic valve regurgitation is not visualized. FINDINGS  Left Ventricle: Left ventricular ejection fraction, by estimation, is 50 to 55%. The left ventricle has low normal function. The left ventricle has no regional wall motion abnormalities. Definity contrast agent was given IV to delineate the left ventricular endocardial borders. The left ventricular internal cavity size was mildly dilated. There is borderline asymmetric left ventricular hypertrophy of the septal segment. Left ventricular diastolic parameters are consistent with Grade I diastolic dysfunction (impaired relaxation). Right Ventricle: The right ventricular size is normal. No increase in right ventricular wall thickness. Right ventricular systolic function is normal. Left Atrium: Left atrial size was normal in size. Right Atrium: Right atrial size was normal in size. Pericardium: There is no evidence of pericardial effusion. Mitral Valve: The mitral valve is normal in structure. No evidence of mitral valve regurgitation. Tricuspid Valve: The tricuspid valve is normal in structure. Tricuspid valve regurgitation is trivial. Aortic Valve: The aortic valve is grossly normal. Aortic valve regurgitation is not visualized. Aortic valve peak gradient measures 6.4 mmHg. Pulmonic Valve: The pulmonic valve was normal in structure. Pulmonic valve regurgitation is not visualized. Aorta: The ascending aorta was not well visualized. IAS/Shunts: No atrial level shunt detected by color flow Doppler.  LEFT VENTRICLE PLAX 2D LVIDd:         4.90 cm   Diastology LVIDs:         3.60 cm   LV e' medial:    3.92 cm/s LV PW:         1.10 cm   LV E/e' medial:  9.8 LV IVS:        1.40 cm   LV e' lateral:   4.68 cm/s LVOT diam:     2.00 cm   LV E/e'  lateral: 8.2 LV SV:         58  LV SV Index:   27 LVOT Area:     3.14 cm  RIGHT VENTRICLE RV Basal diam:  3.00 cm RV S prime:     14.80 cm/s TAPSE (M-mode): 2.2 cm LEFT ATRIUM             Index        RIGHT ATRIUM           Index LA diam:        4.00 cm 1.86 cm/m   RA Area:     12.90 cm LA Vol (A2C):   38.6 ml 17.95 ml/m  RA Volume:   32.10 ml  14.93 ml/m LA Vol (A4C):   40.0 ml 18.60 ml/m LA Biplane Vol: 39.8 ml 18.51 ml/m  AORTIC VALVE                 PULMONIC VALVE AV Area (Vmax): 2.03 cm     PV Vmax:       0.90 m/s AV Vmax:        126.00 cm/s  PV Peak grad:  3.2 mmHg AV Peak Grad:   6.4 mmHg LVOT Vmax:      81.60 cm/s LVOT Vmean:     61.300 cm/s LVOT VTI:       0.185 m  AORTA Ao Root diam: 3.20 cm Ao Asc diam:  3.00 cm MITRAL VALVE MV Area (PHT): 3.12 cm    SHUNTS MV Decel Time: 243 msec    Systemic VTI:  0.18 m MV E velocity: 38.60 cm/s  Systemic Diam: 2.00 cm MV A velocity: 67.30 cm/s MV E/A ratio:  0.57 Dwayne D Callwood MD Electronically signed by Yolonda Kida MD Signature Date/Time: 11/30/2021/5:25:30 PM    Final       Labs: BNP (last 3 results) No results for input(s): BNP in the last 8760 hours. Basic Metabolic Panel: Recent Labs  Lab 11/29/21 1350 11/30/21 0454 12/01/21 0642 12/02/21 0428 12/03/21 0416  NA 137 135 135 135 137  K 3.9 3.9 4.0 4.0 3.8  CL 106 104 104 104 105  CO2 26 27 25 24 24   GLUCOSE 117* 123* 126* 132* 125*  BUN 15 15 15 20 20   CREATININE 1.10 1.01 0.88 1.10 0.99  CALCIUM 8.9 8.8* 8.6* 8.6* 8.6*  MG  --  2.1 2.3 2.4 2.2   Liver Function Tests: No results for input(s): AST, ALT, ALKPHOS, BILITOT, PROT, ALBUMIN in the last 168 hours. No results for input(s): LIPASE, AMYLASE in the last 168 hours. No results for input(s): AMMONIA in the last 168 hours. CBC: Recent Labs  Lab 11/29/21 1350 11/30/21 0454 12/01/21 0642 12/02/21 0428 12/03/21 0416  WBC 10.3 10.1 8.2 10.0 9.5  HGB 14.9 14.1 14.7 15.1 13.8  HCT 42.6 40.6 41.5 43.7 39.2  MCV  93.0 93.5 92.8 92.6 92.0  PLT 225 210 201 212 204   Cardiac Enzymes: No results for input(s): CKTOTAL, CKMB, CKMBINDEX, TROPONINI in the last 168 hours. BNP: Invalid input(s): POCBNP CBG: Recent Labs  Lab 12/03/21 0718  GLUCAP 120*   D-Dimer No results for input(s): DDIMER in the last 72 hours. Hgb A1c No results for input(s): HGBA1C in the last 72 hours. Lipid Profile No results for input(s): CHOL, HDL, LDLCALC, TRIG, CHOLHDL, LDLDIRECT in the last 72 hours. Thyroid function studies No results for input(s): TSH, T4TOTAL, T3FREE, THYROIDAB in the last 72 hours.  Invalid input(s): FREET3 Anemia work up No results for input(s): VITAMINB12, FOLATE, FERRITIN, TIBC, IRON, RETICCTPCT in the  last 72 hours. Urinalysis    Component Value Date/Time   COLORURINE Yellow 02/22/2014 1102   APPEARANCEUR Clear 02/22/2014 1102   LABSPEC 1.011 02/22/2014 1102   PHURINE 8.0 02/22/2014 1102   GLUCOSEU Negative 02/22/2014 1102   HGBUR Negative 02/22/2014 1102   BILIRUBINUR Negative 02/22/2014 1102   KETONESUR Negative 02/22/2014 1102   PROTEINUR Negative 02/22/2014 1102   NITRITE Negative 02/22/2014 1102   LEUKOCYTESUR Negative 02/22/2014 1102   Sepsis Labs Invalid input(s): PROCALCITONIN,  WBC,  LACTICIDVEN Microbiology Recent Results (from the past 240 hour(s))  Resp Panel by RT-PCR (Flu A&B, Covid) Nasopharyngeal Swab     Status: None   Collection Time: 11/29/21  5:16 PM   Specimen: Nasopharyngeal Swab; Nasopharyngeal(NP) swabs in vial transport medium  Result Value Ref Range Status   SARS Coronavirus 2 by RT PCR NEGATIVE NEGATIVE Final    Comment: (NOTE) SARS-CoV-2 target nucleic acids are NOT DETECTED.  The SARS-CoV-2 RNA is generally detectable in upper respiratory specimens during the acute phase of infection. The lowest concentration of SARS-CoV-2 viral copies this assay can detect is 138 copies/mL. A negative result does not preclude SARS-Cov-2 infection and should not be  used as the sole basis for treatment or other patient management decisions. A negative result may occur with  improper specimen collection/handling, submission of specimen other than nasopharyngeal swab, presence of viral mutation(s) within the areas targeted by this assay, and inadequate number of viral copies(<138 copies/mL). A negative result must be combined with clinical observations, patient history, and epidemiological information. The expected result is Negative.  Fact Sheet for Patients:  EntrepreneurPulse.com.au  Fact Sheet for Healthcare Providers:  IncredibleEmployment.be  This test is no t yet approved or cleared by the [place] FDA and  has been authorized for detection and/or diagnosis of SARS-CoV-2 by FDA under an Emergency Use Authorization (EUA). This EUA will remain  in effect (meaning this test can be used) for the duration of the COVID-19 declaration under Section 564(b)(1) of the Act, 21 U.S.C.section 360bbb-3(b)(1), unless the authorization is terminated  or revoked sooner.       Influenza A by PCR NEGATIVE NEGATIVE Final   Influenza B by PCR NEGATIVE NEGATIVE Final    Comment: (NOTE) The Xpert Xpress SARS-CoV-2/FLU/RSV plus assay is intended as an aid in the diagnosis of influenza from Nasopharyngeal swab specimens and should not be used as a sole basis for treatment. Nasal washings and aspirates are unacceptable for Xpert Xpress SARS-CoV-2/FLU/RSV testing.  Fact Sheet for Patients: EntrepreneurPulse.com.au  Fact Sheet for Healthcare Providers: IncredibleEmployment.be  This test is not yet approved or cleared by the [place] FDA and has been authorized for detection and/or diagnosis of SARS-CoV-2 by FDA under an Emergency Use Authorization (EUA). This EUA will remain in effect (meaning this test can be used) for the duration of the COVID-19 declaration under Section  564(b)(1) of the Act, 21 U.S.C. section 360bbb-3(b)(1), unless the authorization is terminated or revoked.  Performed at [facility], [address] [place],  [address]   MRSA Next Gen by PCR, Nasal     Status: Abnormal   Collection Time: 12/01/21  6:00 PM   Specimen: Nasal Mucosa; Nasal Swab  Result Value Ref Range Status   MRSA by PCR Next Gen DETECTED (A) NOT DETECTED Final    Comment: RESULT CALLED TO, READ BACK BY AND VERIFIED WITH: BRENDA PATE 2059 12/01/21 MU (NOTE) The GeneXpert MRSA Assay (FDA approved for NASAL specimens only), is one component of  a comprehensive MRSA colonization surveillance program. It is not intended to diagnose MRSA infection nor to guide or monitor treatment for MRSA infections. Test performance is not FDA approved in patients less than 21 years old. Performed at [facility], [address] [place], [place] [address]      Total time spend on discharging this patient, including the last patient exam, discussing the hospital stay, instructions for ongoing care as it relates to all pertinent caregivers, as well as preparing the medical discharge records, prescriptions, and/or referrals as applicable, is 40 minutes.    Enzo Bi, MD  [facility] 12/03/2021, 8:47 PM

## 2021-12-03 NOTE — Significant Event (Signed)
Interventional Cardiology Note  I was asked to evaluate diagnostic images taken by Dr. Nehemiah Massed regarding Charles Blake.  Briefly this showed a 95% proximal RCA stenosis, and a hazy appearance of the distal left main.  I decided to proceed with possible IFR of the left main into the LAD to determine whether bypass surgery might be appropriate for this patient.  After engagement of the guide catheter, an additional picture was taken at increased fluoroscopy dose and frame rate which revealed an 80% hazy distal left main stenosis at the location of a trifurcation.  Based on this new information I decided that IFR was not necessary, and feel that the best course of action for this patient is referral for CABG.  This was discussed with the referring physician Dr. Nehemiah Massed who is in agreement.  Andrez Grime, MD

## 2021-12-03 NOTE — Progress Notes (Addendum)
Patient complained of severe chest pain 9/10 and tightness this morning at 7:20 am. BP was 196/95. Nitro administered at 7:25 am. Pain down to 7/10 2nd dose at 7:35 am. Pain down to 4/10 3rd dose at 7:45 am/ Pain down to 2/10 EKG performed. NSR but lots of PVCs. Copy in chart. Current vitals at 8 am SpO2 99 on 2L [place], Pulse 67, BP 152/73 MAP 97. Patient states some relief. Currently asleep.  Heparin currently going at 15gtt from 13 gtt yesterday. NS going at 105 ml/hr  Dr Clayborn Bigness Dr Maurie Boettcher. Messaged on patient's condition.  Patient has a tentative Left Cath scheduled later in the day.

## 2021-12-03 NOTE — Progress Notes (Signed)
Charles Blake for heparin infusion Indication: chest pain/ACS  Allergies  Allergen Reactions   Rosuvastatin     Other reaction(s): Other (See Comments) Extreme joint pain & fatigue     Patient Measurements: Height: 5\' 7"  (170.2 cm) Weight: 104.8 kg (231 lb 0.7 oz) IBW/kg (Calculated) : 66.1 Heparin Dosing Weight: 89.3 kg  Vital Signs: Temp: 98 F (36.7 C) (12/27 0245) Temp Source: Oral (12/26 1853) BP: 160/75 (12/27 0245) Pulse Rate: 66 (12/27 0245)  Labs: Recent Labs    11/30/21 1644 12/01/21 0005 12/01/21 0642 12/02/21 0428  HGB  --   --  14.7 15.1  HCT  --   --  41.5 43.7  PLT  --   --  201 212  HEPARINUNFRC 0.33 0.40  --  0.39  CREATININE  --   --  0.88 1.10     Estimated Creatinine Clearance: 76.2 mL/min (by C-G formula based on SCr of 1.1 mg/dL).   Medical History: Past Medical History:  Diagnosis Date   Anemia    Anxiety    Arthritis    fingers and hands   Cancer (HCC)    skin cancer   COPD (chronic obstructive pulmonary disease) (HCC)    wheezing   Coronary artery disease    Dyspnea    GERD (gastroesophageal reflux disease)    Headache    2-3 times/ week   History of hiatal hernia    HOH (hard of hearing)    Hypercholesteremia    Hypertension    controlled   Pneumonia    Sleep apnea    CPAP   Wears dentures    upper    Medications:  Per chart review, no anticoagulation noted prior to admission.   Assessment: 65 y.o. male who presents to the urgent care complaining of central substernal chest pain. Pharmacy has been consulted for heparin.   Date Time HL Rate/comment 12/24 0103 0.39 1100 un/hr, therapeutic 12/24  0735  <0.10 1100 un/hr, subtherapeutic 12/24 1644 0.33 1350 un/hr, therapeutic 12/25   0005    0.40     1350 un/hr, therapeutic  12/26   0428    0.39     1350 units/hr, therapeutic X 3 12/27 0416 0.29 Slightly subtherapeutic   Goal of Therapy:  Heparin level 0.3-0.7 units/ml Monitor  platelets by anticoagulation protocol: Yes   Plan:  Increase heparin infusion to 1500 units/hr. Will recheck HL in 6 hr after rate change, CBC daily while on heparin.   Renda Rolls, PharmD, [facility] 12/03/2021 5:04 AM

## 2021-12-03 NOTE — Progress Notes (Signed)
Patient currently given a PRN order for hydralazine if BP stays elevated.  Patient taken for procedure at 10:45 am.  Report given to Cath nurse.  Heparin infusing at 15 gtt and NS infusing at 105 ml/hr

## 2021-12-03 NOTE — Progress Notes (Signed)
Jessamine for heparin infusion Indication: chest pain/ACS  Allergies  Allergen Reactions   Rosuvastatin     Other reaction(s): Other (See Comments) Extreme joint pain & fatigue     Patient Measurements: Height: 5\' 7"  (170.2 cm) Weight: 104.8 kg (231 lb 0.7 oz) IBW/kg (Calculated) : 66.1 Heparin Dosing Weight: 89.3 kg  Vital Signs: Temp: 98.1 F (36.7 C) (12/27 1110) Temp Source: Oral (12/27 1110) BP: 172/74 (12/27 1500) Pulse Rate: 68 (12/27 1500)  Labs: Recent Labs    12/01/21 0005 12/01/21 0642 12/01/21 0642 12/02/21 0428 12/03/21 0416  HGB  --  14.7   < > 15.1 13.8  HCT  --  41.5  --  43.7 39.2  PLT  --  201  --  212 204  HEPARINUNFRC 0.40  --   --  0.39 0.29*  CREATININE  --  0.88  --  1.10 0.99   < > = values in this interval not displayed.     Estimated Creatinine Clearance: 84.7 mL/min (by C-G formula based on SCr of 0.99 mg/dL).   Medical History: Past Medical History:  Diagnosis Date   Anemia    Anxiety    Arthritis    fingers and hands   Cancer (HCC)    skin cancer   COPD (chronic obstructive pulmonary disease) (HCC)    wheezing   Coronary artery disease    Dyspnea    GERD (gastroesophageal reflux disease)    Headache    2-3 times/ week   History of hiatal hernia    HOH (hard of hearing)    Hypercholesteremia    Hypertension    controlled   Pneumonia    Sleep apnea    CPAP   Wears dentures    upper    Medications:  Per chart review, no anticoagulation noted prior to admission.   Assessment: 65 y.o. male who presents to the urgent care complaining of central substernal chest pain. Pharmacy has been consulted for heparin.   Date Time HL Rate/comment 12/24 0103 0.39 1100 un/hr, therapeutic 12/24  0735  <0.10 1100 un/hr, subtherapeutic 12/24 1644 0.33 1350 un/hr, therapeutic 12/25   0005    0.40     1350 un/hr, therapeutic  12/26   0428    0.39     1350 units/hr, therapeutic X  3 12/27 0416 0.29 Slightly subtherapeutic   Goal of Therapy:  Heparin level 0.3-0.7 units/ml Monitor platelets by anticoagulation protocol: Yes   Plan:  Restart heparin at previous rate (1500 units/hr) 8 hours post sheath removal. Will order heparin to start at 2100. Check heparin level in 6 hours. CBC daily while on heparin. Plan to transfer to [facility] for CABG.   Eleonore Chiquito, PharmD, BCPS 12/03/2021 3:25 PM

## 2021-12-03 NOTE — Progress Notes (Signed)
@  4445 Pt endorsed chest pain 8/10. Dr. Billie Ruddy, pt's attending paged and 12 lead EKG obtained. Page promptly returned and Emergency Chest Pain orders implemented. Nitro administered and at the time of this note's completion, pt endorses pain is now 4/10 and decreasing (post Nitrox3). Day RN, Rose updated on pt's situation during bedside handoff.

## 2021-12-04 ENCOUNTER — Encounter: Payer: Self-pay | Admitting: Internal Medicine

## 2021-12-04 DIAGNOSIS — E119 Type 2 diabetes mellitus without complications: Secondary | ICD-10-CM | POA: Insufficient documentation

## 2021-12-07 DIAGNOSIS — I442 Atrioventricular block, complete: Secondary | ICD-10-CM | POA: Insufficient documentation

## 2021-12-07 DIAGNOSIS — Z951 Presence of aortocoronary bypass graft: Secondary | ICD-10-CM | POA: Insufficient documentation

## 2021-12-07 DIAGNOSIS — T8089XA Other complications following infusion, transfusion and therapeutic injection, initial encounter: Secondary | ICD-10-CM | POA: Insufficient documentation

## 2021-12-07 DIAGNOSIS — I498 Other specified cardiac arrhythmias: Secondary | ICD-10-CM | POA: Insufficient documentation

## 2022-01-03 ENCOUNTER — Encounter: Payer: Medicare Other | Attending: Internal Medicine | Admitting: *Deleted

## 2022-01-03 ENCOUNTER — Other Ambulatory Visit: Payer: Self-pay

## 2022-01-03 DIAGNOSIS — Z951 Presence of aortocoronary bypass graft: Secondary | ICD-10-CM

## 2022-01-03 NOTE — Progress Notes (Signed)
Initial telephone orientation completed. Diagnosis can be found in [facility] 12/23. EP orientation scheduled for Thursday, February 9th at 8am.

## 2022-01-16 ENCOUNTER — Encounter: Payer: Medicare Other | Attending: Internal Medicine | Admitting: *Deleted

## 2022-01-16 ENCOUNTER — Other Ambulatory Visit: Payer: Self-pay

## 2022-01-16 VITALS — Ht 68.4 in | Wt 217.6 lb

## 2022-01-16 DIAGNOSIS — Z951 Presence of aortocoronary bypass graft: Secondary | ICD-10-CM

## 2022-01-16 DIAGNOSIS — I252 Old myocardial infarction: Secondary | ICD-10-CM | POA: Insufficient documentation

## 2022-01-16 NOTE — Progress Notes (Signed)
Cardiac Individual Treatment Plan  Patient Details  Name: Charles Blake MRN: [id number] Date of Birth: [birth year] Referring Provider:   Flowsheet Row Cardiac Rehab from 01/16/2022 in [facility] Cardiac and Pulmonary Rehab  Referring Provider Serafina Royals MD       Initial Encounter Date:  Flowsheet Row Cardiac Rehab from 01/16/2022 in [facility] Cardiac and Pulmonary Rehab  Date 01/16/22       Visit Diagnosis: S/P CABG x 3  Patient's Home Medications on Admission:  Current Outpatient Medications:    acetaminophen (TYLENOL) 650 MG CR tablet, Take 650 mg by mouth every 8 (eight) hours as needed for pain., Disp: , Rfl:    albuterol (PROVENTIL HFA;VENTOLIN HFA) 108 (90 Base) MCG/ACT inhaler, Inhale 2 puffs into the lungs every 4 (four) hours as needed for wheezing., Disp: 1 Inhaler, Rfl: 0   amLODipine (NORVASC) 5 MG tablet, Take 5 mg by mouth daily., Disp: , Rfl:    aspirin EC 81 MG EC tablet, Take 1 tablet (81 mg total) by mouth daily. Swallow whole., Disp: , Rfl:    carvedilol (COREG) 6.25 MG tablet, Take 6.25 mg by mouth 2 (two) times daily with a meal., Disp: , Rfl:    clopidogrel (PLAVIX) 75 MG tablet, Take 1 tablet by mouth once daily (Patient taking differently: Take 75 mg by mouth every evening.), Disp: 90 tablet, Rfl: 0   escitalopram (LEXAPRO) 10 MG tablet, Take 10 mg by mouth in the morning., Disp: , Rfl:    furosemide (LASIX) 20 MG tablet, Take 20 mg by mouth daily., Disp: , Rfl:    heparin 25000 UT/250ML infusion, Inject 1,500 Units/hr into the vein continuous. (Patient not taking: Reported on 01/03/2022), Disp: , Rfl:    isosorbide mononitrate (IMDUR) 60 MG 24 hr tablet, Take 60 mg by mouth daily. (Patient not taking: Reported on 01/03/2022), Disp: , Rfl:    ketoconazole (NIZORAL) 2 % shampoo, as needed., Disp: , Rfl:    KLOR-CON M20 20 MEQ tablet, Take 40 mEq by mouth 2 (two) times daily., Disp: , Rfl:    lovastatin (MEVACOR) 40 MG tablet, Take 40 mg by mouth at bedtime., Disp: ,  Rfl:    lovastatin (MEVACOR) 40 MG tablet, Take 1 tablet by mouth daily with supper., Disp: , Rfl:    Melatonin 3 MG CAPS, Take 3 mg by mouth at bedtime., Disp: , Rfl:    metFORMIN (GLUCOPHAGE) 500 MG tablet, Take 500 mg by mouth 2 (two) times daily., Disp: , Rfl:    Multiple Vitamin (MULTI-VITAMINS) TABS, Take 1 tablet by mouth daily., Disp: , Rfl:    niacin (SLO-NIACIN) 500 MG tablet, Take 500 mg by mouth at bedtime., Disp: , Rfl:    nitroGLYCERIN (NITROSTAT) 0.4 MG SL tablet, Place 1 tablet (0.4 mg total) under the tongue every 5 (five) minutes as needed for chest pain. (Patient not taking: Reported on 01/03/2022), Disp: , Rfl:    pantoprazole (PROTONIX) 40 MG tablet, Take 40 mg by mouth daily., Disp: , Rfl:    telmisartan-hydrochlorothiazide (MICARDIS HCT) 80-12.5 MG tablet, Take 1 tablet by mouth daily. (Patient not taking: Reported on 01/03/2022), Disp: , Rfl: 3   umeclidinium-vilanterol (ANORO ELLIPTA) 62.5-25 MCG/ACT AEPB, Inhale 1 puff into the lungs daily., Disp: , Rfl:   Past Medical History: Past Medical History:  Diagnosis Date   Anemia    Anxiety    Arthritis    fingers and hands   Cancer ([place])    skin cancer   COPD (chronic obstructive pulmonary  disease) ([place])    wheezing   Coronary artery disease    Dyspnea    GERD (gastroesophageal reflux disease)    Headache    2-3 times/ week   History of hiatal hernia    HOH (hard of hearing)    Hypercholesteremia    Hypertension    controlled   Pneumonia    Sleep apnea    CPAP   Wears dentures    upper    Tobacco Use: Social History   Tobacco Use  Smoking Status Former   Packs/day: 0.50   Years: 40.00   Pack years: 20.00   Types: Cigarettes   Quit date: 11/29/2021   Years since quitting: 0.1  Smokeless Tobacco Former    Labs: Recent Chemical engineer   There is no flowsheet data to display.      Exercise Target Goals: Exercise Program Goal: Individual exercise prescription set using results from  initial 6 min walk test and THRR while considering  patients activity barriers and safety.   Exercise Prescription Goal: Initial exercise prescription builds to 30-45 minutes a day of aerobic activity, 2-3 days per week.  Home exercise guidelines will be given to patient during program as part of exercise prescription that the participant will acknowledge.   Education: Aerobic Exercise: - Group verbal and visual presentation on the components of exercise prescription. Introduces F.I.T.T principle from ACSM for exercise prescriptions.  Reviews F.I.T.T. principles of aerobic exercise including progression. Written material given at graduation. Flowsheet Row Cardiac Rehab from 01/16/2022 in [facility] Cardiac and Pulmonary Rehab  Education need identified 01/16/22       Education: Resistance Exercise: - Group verbal and visual presentation on the components of exercise prescription. Introduces F.I.T.T principle from ACSM for exercise prescriptions  Reviews F.I.T.T. principles of resistance exercise including progression. Written material given at graduation.    Education: Exercise & Equipment Safety: - Individual verbal instruction and demonstration of equipment use and safety with use of the equipment. Flowsheet Row Cardiac Rehab from 01/16/2022 in [facility] Cardiac and Pulmonary Rehab  Date 01/16/22  Educator [facility]  Instruction Review Code 1- Verbalizes Understanding       Education: Exercise Physiology & General Exercise Guidelines: - Group verbal and written instruction with models to review the exercise physiology of the cardiovascular system and associated critical values. Provides general exercise guidelines with specific guidelines to those with heart or lung disease.    Education: Flexibility, Balance, Mind/Body Relaxation: - Group verbal and visual presentation with interactive activity on the components of exercise prescription. Introduces F.I.T.T principle from ACSM for exercise  prescriptions. Reviews F.I.T.T. principles of flexibility and balance exercise training including progression. Also discusses the mind body connection.  Reviews various relaxation techniques to help reduce and manage stress (i.e. Deep breathing, progressive muscle relaxation, and visualization). Balance handout provided to take home. Written material given at graduation.   Activity Barriers & Risk Stratification:  Activity Barriers & Cardiac Risk Stratification - 01/16/22 1045       Activity Barriers & Cardiac Risk Stratification   Activity Barriers Other (comment);Muscular Weakness;Deconditioning;Balance Concerns;Shortness of Breath    Comments PAD w/ stent- can slow him down some    Cardiac Risk Stratification High             6 Minute Walk:  6 Minute Walk     Row Name 01/16/22 1044         6 Minute Walk   Phase Initial     Distance 960 feet  Walk Time 6 minutes     # of Rest Breaks 0     MPH 1.82     METS 2.4     RPE 17     Perceived Dyspnea  2     VO2 Peak 8.4     Symptoms Yes (comment)     Comments PAD claudication (6/10, CPS 4/5), SOB     Resting HR 65 bpm     Resting BP 114/64     Resting Oxygen Saturation  97 %     Exercise Oxygen Saturation  during 6 min walk 96 %     Max Ex. HR 108 bpm     Max Ex. BP 142/84     2 Minute Post BP 134/60              Oxygen Initial Assessment:   Oxygen Re-Evaluation:   Oxygen Discharge (Final Oxygen Re-Evaluation):   Initial Exercise Prescription:  Initial Exercise Prescription - 01/16/22 1000       Date of Initial Exercise RX and Referring Provider   Date 01/16/22    Referring Provider Serafina Royals MD      Oxygen   Maintain Oxygen Saturation 88% or higher      Treadmill   MPH 1.7    Grade 0    Minutes 15    METs 2.3      Recumbant Bike   Level 1    RPM 50    Watts 12    Minutes 15    METs 2      REL-XR   Level 1    Speed 50    Minutes 15    METs 2      T5 Nustep   Level 1    SPM  80    Minutes 15    METs 2      Track   Laps 23    Minutes 15    METs 2.25      Prescription Details   Frequency (times per week) 2    Duration Progress to 30 minutes of continuous aerobic without signs/symptoms of physical distress      Intensity   THRR 40-80% of Max Heartrate 101-136    Ratings of Perceived Exertion 11-13    Perceived Dyspnea 0-4      Progression   Progression Continue to progress workloads to maintain intensity without signs/symptoms of physical distress.      Resistance Training   Training Prescription Yes    Weight 4 lb    Reps 10-15             Perform Capillary Blood Glucose checks as needed.  Exercise Prescription Changes:   Exercise Prescription Changes     Row Name 01/16/22 1000             Response to Exercise   Blood Pressure (Admit) 114/64       Blood Pressure (Exercise) 142/84       Blood Pressure (Exit) 134/60       Heart Rate (Admit) 65 bpm       Heart Rate (Exercise) 108 bpm       Heart Rate (Exit) 76 bpm       Oxygen Saturation (Admit) 97 %       Oxygen Saturation (Exercise) 96 %       Rating of Perceived Exertion (Exercise) 17       Perceived Dyspnea (Exercise) 2       Symptoms SOB,  claudication pain (6/10, 4/5)       Comments walk test results                Exercise Comments:   Exercise Goals and Review:   Exercise Goals     Row Name 01/16/22 1048             Exercise Goals   Increase Physical Activity Yes       Intervention Provide advice, education, support and counseling about physical activity/exercise needs.;Develop an individualized exercise prescription for aerobic and resistive training based on initial evaluation findings, risk stratification, comorbidities and participant's personal goals.       Expected Outcomes Long Term: Exercising regularly at least 3-5 days a week.;Long Term: Add in home exercise to make exercise part of routine and to increase amount of physical activity.;Short Term:  Attend rehab on a regular basis to increase amount of physical activity.       Increase Strength and Stamina Yes       Intervention Provide advice, education, support and counseling about physical activity/exercise needs.;Develop an individualized exercise prescription for aerobic and resistive training based on initial evaluation findings, risk stratification, comorbidities and participant's personal goals.       Expected Outcomes Short Term: Increase workloads from initial exercise prescription for resistance, speed, and METs.;Short Term: Perform resistance training exercises routinely during rehab and add in resistance training at home;Long Term: Improve cardiorespiratory fitness, muscular endurance and strength as measured by increased METs and functional capacity (6MWT)       Able to understand and use rate of perceived exertion (RPE) scale Yes       Intervention Provide education and explanation on how to use RPE scale       Expected Outcomes Short Term: Able to use RPE daily in rehab to express subjective intensity level;Long Term:  Able to use RPE to guide intensity level when exercising independently       Able to understand and use Dyspnea scale Yes       Intervention Provide education and explanation on how to use Dyspnea scale       Expected Outcomes Short Term: Able to use Dyspnea scale daily in rehab to express subjective sense of shortness of breath during exertion;Long Term: Able to use Dyspnea scale to guide intensity level when exercising independently       Knowledge and understanding of Target Heart Rate Range (THRR) Yes       Intervention Provide education and explanation of THRR including how the numbers were predicted and where they are located for reference       Expected Outcomes Short Term: Able to state/look up THRR;Long Term: Able to use THRR to govern intensity when exercising independently;Short Term: Able to use daily as guideline for intensity in rehab       Able to check  pulse independently Yes       Intervention Provide education and demonstration on how to check pulse in carotid and radial arteries.;Review the importance of being able to check your own pulse for safety during independent exercise       Expected Outcomes Short Term: Able to explain why pulse checking is important during independent exercise;Long Term: Able to check pulse independently and accurately       Understanding of Exercise Prescription Yes       Intervention Provide education, explanation, and written materials on patient's individual exercise prescription       Expected Outcomes Short Term: Able to explain  program exercise prescription;Long Term: Able to explain home exercise prescription to exercise independently                Exercise Goals Re-Evaluation :   Discharge Exercise Prescription (Final Exercise Prescription Changes):  Exercise Prescription Changes - 01/16/22 1000       Response to Exercise   Blood Pressure (Admit) 114/64    Blood Pressure (Exercise) 142/84    Blood Pressure (Exit) 134/60    Heart Rate (Admit) 65 bpm    Heart Rate (Exercise) 108 bpm    Heart Rate (Exit) 76 bpm    Oxygen Saturation (Admit) 97 %    Oxygen Saturation (Exercise) 96 %    Rating of Perceived Exertion (Exercise) 17    Perceived Dyspnea (Exercise) 2    Symptoms SOB, claudication pain (6/10, 4/5)    Comments walk test results             Nutrition:  Target Goals: Understanding of nutrition guidelines, daily intake of sodium 1500mg , cholesterol 200mg , calories 30% from fat and 7% or less from saturated fats, daily to have 5 or more servings of fruits and vegetables.  Education: All About Nutrition: -Group instruction provided by verbal, written material, interactive activities, discussions, models, and posters to present general guidelines for heart healthy nutrition including fat, fiber, MyPlate, the role of sodium in heart healthy nutrition, utilization of the nutrition  label, and utilization of this knowledge for meal planning. Follow up email sent as well. Written material given at graduation. Flowsheet Row Cardiac Rehab from 01/16/2022 in [facility] Cardiac and Pulmonary Rehab  Education need identified 01/16/22       Biometrics:  Pre Biometrics - 01/16/22 1049       Pre Biometrics   Height 5' 8.4" (1.737 m)    Weight 217 lb 9.6 oz (98.7 kg)    BMI (Calculated) 32.71    Single Leg Stand 5.2 seconds              Nutrition Therapy Plan and Nutrition Goals:  Nutrition Therapy & Goals - 01/16/22 1049       Intervention Plan   Intervention Prescribe, educate and counsel regarding individualized specific dietary modifications aiming towards targeted core components such as weight, hypertension, lipid management, diabetes, heart failure and other comorbidities.    Expected Outcomes Short Term Goal: Understand basic principles of dietary content, such as calories, fat, sodium, cholesterol and nutrients.;Short Term Goal: A plan has been developed with personal nutrition goals set during dietitian appointment.;Long Term Goal: Adherence to prescribed nutrition plan.             Nutrition Assessments:  MEDIFICTS Score Key: ?70 Need to make dietary changes  40-70 Heart Healthy Diet ? 40 Therapeutic Level Cholesterol Diet  Flowsheet Row Cardiac Rehab from 01/16/2022 in [facility] Cardiac and Pulmonary Rehab  Picture Your Plate Total Score on Admission 58      Picture Your Plate Scores: <09 Unhealthy dietary pattern with much room for improvement. 41-50 Dietary pattern unlikely to meet recommendations for good health and room for improvement. 51-60 More healthful dietary pattern, with some room for improvement.  >60 Healthy dietary pattern, although there may be some specific behaviors that could be improved.    Nutrition Goals Re-Evaluation:   Nutrition Goals Discharge (Final Nutrition Goals Re-Evaluation):   Psychosocial: Target Goals:  Acknowledge presence or absence of significant depression and/or stress, maximize coping skills, provide positive support system. Participant is able to verbalize types and ability to use  techniques and skills needed for reducing stress and depression.   Education: Stress, Anxiety, and Depression - Group verbal and visual presentation to define topics covered.  Reviews how body is impacted by stress, anxiety, and depression.  Also discusses healthy ways to reduce stress and to treat/manage anxiety and depression.  Written material given at graduation.   Education: Sleep Hygiene -Provides group verbal and written instruction about how sleep can affect your health.  Define sleep hygiene, discuss sleep cycles and impact of sleep habits. Review good sleep hygiene tips.    Initial Review & Psychosocial Screening:  Initial Psych Review & Screening - 01/03/22 1312       Initial Review   Current issues with Current Stress Concerns    Source of Stress Concerns Unable to participate in former interests or hobbies;Unable to perform yard/household activities      [place]? Yes   wife     Barriers   Psychosocial barriers to participate in program The patient should benefit from training in stress management and relaxation.;There are no identifiable barriers or psychosocial needs.      Screening Interventions   Interventions Encouraged to exercise;Provide feedback about the scores to participant;To provide support and resources with identified psychosocial needs    Expected Outcomes Short Term goal: Utilizing psychosocial counselor, staff and physician to assist with identification of specific Stressors or current issues interfering with healing process. Setting desired goal for each stressor or current issue identified.;Long Term Goal: Stressors or current issues are controlled or eliminated.;Short Term goal: Identification and review with participant of any Quality of Life  or Depression concerns found by scoring the questionnaire.;Long Term goal: The participant improves quality of Life and PHQ9 Scores as seen by post scores and/or verbalization of changes             Quality of Life Scores:   Quality of Life - 01/16/22 1049       Quality of Life   Select Quality of Life      Quality of Life Scores   Health/Function Pre 19.9 %    Socioeconomic Pre 24.86 %    Psych/Spiritual Pre 24.64 %    Family Pre 23.6 %    GLOBAL Pre 22.44 %            Scores of 19 and below usually indicate a poorer quality of life in these areas.  A difference of  2-3 points is a clinically meaningful difference.  A difference of 2-3 points in the total score of the Quality of Life Index has been associated with significant improvement in overall quality of life, self-image, physical symptoms, and general health in studies assessing change in quality of life.  PHQ-9: Recent Review Flowsheet Data     Depression screen [facility] 2/9 01/16/2022   Decreased Interest 1   Down, Depressed, Hopeless 0   PHQ - 2 Score 1   Altered sleeping 1   Tired, decreased energy 1   Change in appetite 1   Feeling bad or failure about yourself  0   Trouble concentrating 0   Moving slowly or fidgety/restless 0   Suicidal thoughts 0   PHQ-9 Score 4   Difficult doing work/chores Not difficult at all      Interpretation of Total Score  Total Score Depression Severity:  1-4 = Minimal depression, 5-9 = Mild depression, 10-14 = Moderate depression, 15-19 = Moderately severe depression, 20-27 = Severe depression   Psychosocial Evaluation and  Intervention:  Psychosocial Evaluation - 01/03/22 1320       Psychosocial Evaluation & Interventions   Interventions Encouraged to exercise with the program and follow exercise prescription;Relaxation education    Comments Charles Blake reports doing well after his CABG x 3, however he is ready to get back to his usual activities (outdoor chores, lifting, etc.).  He saw his surgeon two days ago and received a good report. He has noticed his arm muscles have decreased significantly and wants to gain some muscle tone back. He and his wife are understanding that this is part of the process in that he has to wait for his chest to heal before increasing activity loads. He is ready to get started in the program so he can gain some stamina back.    Expected Outcomes Short; attend cardiac rehab for education and exercise. Long: develop and maintain positive self care habits.    Continue Psychosocial Services  Follow up required by staff             Psychosocial Re-Evaluation:   Psychosocial Discharge (Final Psychosocial Re-Evaluation):   Vocational Rehabilitation: Provide vocational rehab assistance to qualifying candidates.   Vocational Rehab Evaluation & Intervention:  Vocational Rehab - 01/03/22 1312       Initial Vocational Rehab Evaluation & Intervention   Assessment shows need for Vocational Rehabilitation No             Education: Education Goals: Education classes will be provided on a variety of topics geared toward better understanding of heart health and risk factor modification. Participant will state understanding/return demonstration of topics presented as noted by education test scores.  Learning Barriers/Preferences:  Learning Barriers/Preferences - 01/03/22 1312       Learning Barriers/Preferences   Learning Barriers None    Learning Preferences Individual Instruction             General Cardiac Education Topics:  AED/CPR: - Group verbal and written instruction with the use of models to demonstrate the basic use of the AED with the basic ABC's of resuscitation.   Anatomy and Cardiac Procedures: - Group verbal and visual presentation and models provide information about basic cardiac anatomy and function. Reviews the testing methods done to diagnose heart disease and the outcomes of the test results. Describes  the treatment choices: Medical Management, Angioplasty, or Coronary Bypass Surgery for treating various heart conditions including Myocardial Infarction, Angina, Valve Disease, and Cardiac Arrhythmias.  Written material given at graduation. Flowsheet Row Cardiac Rehab from 01/16/2022 in [facility] Cardiac and Pulmonary Rehab  Education need identified 01/16/22       Medication Safety: - Group verbal and visual instruction to review commonly prescribed medications for heart and lung disease. Reviews the medication, class of the drug, and side effects. Includes the steps to properly store meds and maintain the prescription regimen.  Written material given at graduation.   Intimacy: - Group verbal instruction through game format to discuss how heart and lung disease can affect sexual intimacy. Written material given at graduation..   Know Your Numbers and Heart Failure: - Group verbal and visual instruction to discuss disease risk factors for cardiac and pulmonary disease and treatment options.  Reviews associated critical values for Overweight/Obesity, Hypertension, Cholesterol, and Diabetes.  Discusses basics of heart failure: signs/symptoms and treatments.  Introduces Heart Failure Zone chart for action plan for heart failure.  Written material given at graduation.   Infection Prevention: - Provides verbal and written material to individual with discussion of infection  control including proper hand washing and proper equipment cleaning during exercise session. Flowsheet Row Cardiac Rehab from 01/16/2022 in [facility] Cardiac and Pulmonary Rehab  Date 01/16/22  Educator [facility]  Instruction Review Code 1- Verbalizes Understanding       Falls Prevention: - Provides verbal and written material to individual with discussion of falls prevention and safety. Flowsheet Row Cardiac Rehab from 01/16/2022 in [facility] Cardiac and Pulmonary Rehab  Date 01/16/22  Educator [facility]  Instruction Review Code 1- Verbalizes  Understanding       Other: -Provides group and verbal instruction on various topics (see comments)   Knowledge Questionnaire Score:  Knowledge Questionnaire Score - 01/16/22 1050       Knowledge Questionnaire Score   Pre Score 22/26             Core Components/Risk Factors/Patient Goals at Admission:  Personal Goals and Risk Factors at Admission - 01/16/22 1050       Core Components/Risk Factors/Patient Goals on Admission    Weight Management Yes;Obesity;Weight Loss    Intervention Weight Management: Develop a combined nutrition and exercise program designed to reach desired caloric intake, while maintaining appropriate intake of nutrient and fiber, sodium and fats, and appropriate energy expenditure required for the weight goal.;Weight Management: Provide education and appropriate resources to help participant work on and attain dietary goals.;Weight Management/Obesity: Establish reasonable short term and long term weight goals.;Obesity: Provide education and appropriate resources to help participant work on and attain dietary goals.    Admit Weight 217 lb 9.6 oz (98.7 kg)    Goal Weight: Short Term 212 lb (96.2 kg)    Goal Weight: Long Term 205 lb (93 kg)    Expected Outcomes Short Term: Continue to assess and modify interventions until short term weight is achieved;Long Term: Adherence to nutrition and physical activity/exercise program aimed toward attainment of established weight goal;Weight Loss: Understanding of general recommendations for a balanced deficit meal plan, which promotes 1-2 lb weight loss per week and includes a negative energy balance of [phone] kcal/d;Understanding recommendations for meals to include 15-35% energy as protein, 25-35% energy from fat, 35-60% energy from carbohydrates, less than 200mg  of dietary cholesterol, 20-35 gm of total fiber daily;Understanding of distribution of calorie intake throughout the day with the consumption of 4-5 meals/snacks     Tobacco Cessation Yes    Number of packs per day quit 11/29/21    Intervention Offer self-teaching materials, assist with locating and accessing local/national Quit Smoking programs, and support quit date choice.    Expected Outcomes Short Term: Will demonstrate readiness to quit, by selecting a quit date.;Short Term: Will quit all tobacco product use, adhering to prevention of relapse plan.;Long Term: Complete abstinence from all tobacco products for at least 12 months from quit date.    Diabetes Yes    Intervention Provide education about signs/symptoms and action to take for hypo/hyperglycemia.;Provide education about proper nutrition, including hydration, and aerobic/resistive exercise prescription along with prescribed medications to achieve blood glucose in normal ranges: Fasting glucose 65-99 mg/dL    Expected Outcomes Short Term: Participant verbalizes understanding of the signs/symptoms and immediate care of hyper/hypoglycemia, proper foot care and importance of medication, aerobic/resistive exercise and nutrition plan for blood glucose control.;Long Term: Attainment of HbA1C < 7%.    Hypertension Yes    Intervention Provide education on lifestyle modifcations including regular physical activity/exercise, weight management, moderate sodium restriction and increased consumption of fresh fruit, vegetables, and low fat dairy, alcohol moderation, and smoking cessation.;Monitor prescription  use compliance.    Expected Outcomes Short Term: Continued assessment and intervention until BP is < 140/42mm HG in hypertensive participants. < 130/74mm HG in hypertensive participants with diabetes, heart failure or chronic kidney disease.;Long Term: Maintenance of blood pressure at goal levels.    Lipids Yes    Intervention Provide education and support for participant on nutrition & aerobic/resistive exercise along with prescribed medications to achieve LDL 70mg , HDL >40mg .    Expected Outcomes Short Term:  Participant states understanding of desired cholesterol values and is compliant with medications prescribed. Participant is following exercise prescription and nutrition guidelines.;Long Term: Cholesterol controlled with medications as prescribed, with individualized exercise RX and with personalized nutrition plan. Value goals: LDL < 70mg , HDL > 40 mg.             Education:Diabetes - Individual verbal and written instruction to review signs/symptoms of diabetes, desired ranges of glucose level fasting, after meals and with exercise. Acknowledge that pre and post exercise glucose checks will be done for 3 sessions at entry of program.   Core Components/Risk Factors/Patient Goals Review:    Core Components/Risk Factors/Patient Goals at Discharge (Final Review):    ITP Comments:  ITP Comments     Row Name 01/03/22 1349 01/16/22 1042         ITP Comments Initial telephone orientation completed. Diagnosis can be found in [facility] 12/23. EP orientation scheduled for Thursday, February 9th at 8am. Completed 6MWT and gym orientation. Initial ITP created and sent for review to Dr. Emily Filbert, Medical Director.               Comments: Initial ITP  Charles Blake has recently quit tobacco use within the last 6 months. Intervention for relapse prevention was provided at the initial medical review. He was encouraged to continue to with tobacco cessation and was provided information on relapse prevention. Patient talked about about combination therapy, tobacco cessation classes, quit line, and quit smoking apps in case of a relapse and information is available. Patient demonstrated understanding of this material.Staff will continue to provide encouragement and follow up with the patient throughout the program.

## 2022-01-16 NOTE — Patient Instructions (Signed)
Patient Instructions  Patient Details  Name: Charles Blake MRN: [id number] Date of Birth: 11-16-1956 Referring Provider:  Corey Skains, MD  Below are your personal goals for exercise, nutrition, and risk factors. Our goal is to help you stay on track towards obtaining and maintaining these goals. We will be discussing your progress on these goals with you throughout the program.  Initial Exercise Prescription:  Initial Exercise Prescription - 01/16/22 1000       Date of Initial Exercise RX and Referring Provider   Date 01/16/22    Referring Provider Serafina Royals MD      Oxygen   Maintain Oxygen Saturation 88% or higher      Treadmill   MPH 1.7    Grade 0    Minutes 15    METs 2.3      Recumbant Bike   Level 1    RPM 50    Watts 12    Minutes 15    METs 2      REL-XR   Level 1    Speed 50    Minutes 15    METs 2      T5 Nustep   Level 1    SPM 80    Minutes 15    METs 2      Track   Laps 23    Minutes 15    METs 2.25      Prescription Details   Frequency (times per week) 2    Duration Progress to 30 minutes of continuous aerobic without signs/symptoms of physical distress      Intensity   THRR 40-80% of Max Heartrate 101-136    Ratings of Perceived Exertion 11-13    Perceived Dyspnea 0-4      Progression   Progression Continue to progress workloads to maintain intensity without signs/symptoms of physical distress.      Resistance Training   Training Prescription Yes    Weight 4 lb    Reps 10-15             Exercise Goals: Frequency: Be able to perform aerobic exercise two to three times per week in program working toward 2-5 days per week of home exercise.  Intensity: Work with a perceived exertion of 11 (fairly light) - 15 (hard) while following your exercise prescription.  We will make changes to your prescription with you as you progress through the program.   Duration: Be able to do 30 to 45 minutes of continuous aerobic  exercise in addition to a 5 minute warm-up and a 5 minute cool-down routine.   Nutrition Goals: Your personal nutrition goals will be established when you do your nutrition analysis with the dietician.  The following are general nutrition guidelines to follow: Cholesterol < 200mg /day Sodium < 1500mg /day Fiber: Men over 50 yrs - 30 grams per day  Personal Goals:  Personal Goals and Risk Factors at Admission - 01/16/22 1050       Core Components/Risk Factors/Patient Goals on Admission    Weight Management Yes;Obesity;Weight Loss    Intervention Weight Management: Develop a combined nutrition and exercise program designed to reach desired caloric intake, while maintaining appropriate intake of nutrient and fiber, sodium and fats, and appropriate energy expenditure required for the weight goal.;Weight Management: Provide education and appropriate resources to help participant work on and attain dietary goals.;Weight Management/Obesity: Establish reasonable short term and long term weight goals.;Obesity: Provide education and appropriate resources to help participant work on and attain  dietary goals.    Admit Weight 217 lb 9.6 oz (98.7 kg)    Goal Weight: Short Term 212 lb (96.2 kg)    Goal Weight: Long Term 205 lb (93 kg)    Expected Outcomes Short Term: Continue to assess and modify interventions until short term weight is achieved;Long Term: Adherence to nutrition and physical activity/exercise program aimed toward attainment of established weight goal;Weight Loss: Understanding of general recommendations for a balanced deficit meal plan, which promotes 1-2 lb weight loss per week and includes a negative energy balance of [phone] kcal/d;Understanding recommendations for meals to include 15-35% energy as protein, 25-35% energy from fat, 35-60% energy from carbohydrates, less than 200mg  of dietary cholesterol, 20-35 gm of total fiber daily;Understanding of distribution of calorie intake throughout  the day with the consumption of 4-5 meals/snacks    Tobacco Cessation Yes    Number of packs per day quit 11/29/21    Intervention Offer self-teaching materials, assist with locating and accessing local/national Quit Smoking programs, and support quit date choice.    Expected Outcomes Short Term: Will demonstrate readiness to quit, by selecting a quit date.;Short Term: Will quit all tobacco product use, adhering to prevention of relapse plan.;Long Term: Complete abstinence from all tobacco products for at least 12 months from quit date.    Diabetes Yes    Intervention Provide education about signs/symptoms and action to take for hypo/hyperglycemia.;Provide education about proper nutrition, including hydration, and aerobic/resistive exercise prescription along with prescribed medications to achieve blood glucose in normal ranges: Fasting glucose 65-99 mg/dL    Expected Outcomes Short Term: Participant verbalizes understanding of the signs/symptoms and immediate care of hyper/hypoglycemia, proper foot care and importance of medication, aerobic/resistive exercise and nutrition plan for blood glucose control.;Long Term: Attainment of HbA1C < 7%.    Hypertension Yes    Intervention Provide education on lifestyle modifcations including regular physical activity/exercise, weight management, moderate sodium restriction and increased consumption of fresh fruit, vegetables, and low fat dairy, alcohol moderation, and smoking cessation.;Monitor prescription use compliance.    Expected Outcomes Short Term: Continued assessment and intervention until BP is < 140/88mm HG in hypertensive participants. < 130/48mm HG in hypertensive participants with diabetes, heart failure or chronic kidney disease.;Long Term: Maintenance of blood pressure at goal levels.    Lipids Yes    Intervention Provide education and support for participant on nutrition & aerobic/resistive exercise along with prescribed medications to achieve LDL  70mg , HDL >40mg .    Expected Outcomes Short Term: Participant states understanding of desired cholesterol values and is compliant with medications prescribed. Participant is following exercise prescription and nutrition guidelines.;Long Term: Cholesterol controlled with medications as prescribed, with individualized exercise RX and with personalized nutrition plan. Value goals: LDL < 70mg , HDL > 40 mg.             Tobacco Use Initial Evaluation: Social History   Tobacco Use  Smoking Status Former   Packs/day: 0.50   Years: 40.00   Pack years: 20.00   Types: Cigarettes   Quit date: 11/29/2021   Years since quitting: 0.1  Smokeless Tobacco Former    Exercise Goals and Review:  Exercise Goals     Row Name 01/16/22 1048             Exercise Goals   Increase Physical Activity Yes       Intervention Provide advice, education, support and counseling about physical activity/exercise needs.;Develop an individualized exercise prescription for aerobic and resistive training based on initial  evaluation findings, risk stratification, comorbidities and participant's personal goals.       Expected Outcomes Long Term: Exercising regularly at least 3-5 days a week.;Long Term: Add in home exercise to make exercise part of routine and to increase amount of physical activity.;Short Term: Attend rehab on a regular basis to increase amount of physical activity.       Increase Strength and Stamina Yes       Intervention Provide advice, education, support and counseling about physical activity/exercise needs.;Develop an individualized exercise prescription for aerobic and resistive training based on initial evaluation findings, risk stratification, comorbidities and participant's personal goals.       Expected Outcomes Short Term: Increase workloads from initial exercise prescription for resistance, speed, and METs.;Short Term: Perform resistance training exercises routinely during rehab and add in  resistance training at home;Long Term: Improve cardiorespiratory fitness, muscular endurance and strength as measured by increased METs and functional capacity (6MWT)       Able to understand and use rate of perceived exertion (RPE) scale Yes       Intervention Provide education and explanation on how to use RPE scale       Expected Outcomes Short Term: Able to use RPE daily in rehab to express subjective intensity level;Long Term:  Able to use RPE to guide intensity level when exercising independently       Able to understand and use Dyspnea scale Yes       Intervention Provide education and explanation on how to use Dyspnea scale       Expected Outcomes Short Term: Able to use Dyspnea scale daily in rehab to express subjective sense of shortness of breath during exertion;Long Term: Able to use Dyspnea scale to guide intensity level when exercising independently       Knowledge and understanding of Target Heart Rate Range (THRR) Yes       Intervention Provide education and explanation of THRR including how the numbers were predicted and where they are located for reference       Expected Outcomes Short Term: Able to state/look up THRR;Long Term: Able to use THRR to govern intensity when exercising independently;Short Term: Able to use daily as guideline for intensity in rehab       Able to check pulse independently Yes       Intervention Provide education and demonstration on how to check pulse in carotid and radial arteries.;Review the importance of being able to check your own pulse for safety during independent exercise       Expected Outcomes Short Term: Able to explain why pulse checking is important during independent exercise;Long Term: Able to check pulse independently and accurately       Understanding of Exercise Prescription Yes       Intervention Provide education, explanation, and written materials on patient's individual exercise prescription       Expected Outcomes Short Term: Able to  explain program exercise prescription;Long Term: Able to explain home exercise prescription to exercise independently                Copy of goals given to participant.

## 2022-01-21 ENCOUNTER — Other Ambulatory Visit: Payer: Self-pay

## 2022-01-21 DIAGNOSIS — Z951 Presence of aortocoronary bypass graft: Secondary | ICD-10-CM

## 2022-01-21 LAB — GLUCOSE, CAPILLARY
Glucose-Capillary: 136 mg/dL — ABNORMAL HIGH (ref 70–99)
Glucose-Capillary: 79 mg/dL (ref 70–99)

## 2022-01-21 NOTE — Progress Notes (Signed)
Daily Session Note  Patient Details  Name: Charles Blake MRN: [id number] Date of Birth: [birth year] Referring Provider:   Flowsheet Row Cardiac Rehab from 01/16/2022 in [facility] Cardiac and Pulmonary Rehab  Referring Provider Serafina Royals MD       Encounter Date: 01/21/2022  Check In:  Session Check In - 01/21/22 0908       Check-In   Supervising physician immediately available to respond to emergencies See telemetry face sheet for immediately available ER MD    Location [facility]-Cardiac & Pulmonary Rehab    Staff Present Birdie Sons, MPA, RN;Jessica Luan Pulling, MA, RCEP, CCRP, CCET;Amanda Sommer, BA, ACSM CEP, Exercise Physiologist    Virtual Visit No    Medication changes reported     No    Fall or balance concerns reported    No    Warm-up and Cool-down Performed on first and last piece of equipment    Resistance Training Performed Yes    VAD Patient? No    PAD/SET Patient? No      Pain Assessment   Currently in Pain? No/denies                Social History   Tobacco Use  Smoking Status Former   Packs/day: 0.50   Years: 40.00   Pack years: 20.00   Types: Cigarettes   Quit date: 11/29/2021   Years since quitting: 0.1  Smokeless Tobacco Former    Goals Met:  Independence with exercise equipment Exercise tolerated well No report of concerns or symptoms today Strength training completed today  Goals Unmet:  Not Applicable  Comments: First full day of exercise!  Patient was oriented to gym and equipment including functions, settings, policies, and procedures.  Patient's individual exercise prescription and treatment plan were reviewed.  All starting workloads were established based on the results of the 6 minute walk test done at initial orientation visit.  The plan for exercise progression was also introduced and progression will be customized based on patient's performance and goals.    Dr. Emily Filbert is Medical Director for [place].  Dr. Ottie Glazier is Medical Director for [facility] Pulmonary Rehabilitation.

## 2022-01-23 ENCOUNTER — Other Ambulatory Visit: Payer: Self-pay

## 2022-01-23 DIAGNOSIS — Z951 Presence of aortocoronary bypass graft: Secondary | ICD-10-CM | POA: Diagnosis not present

## 2022-01-23 NOTE — Progress Notes (Signed)
Daily Session Note  Patient Details  Name: ZAAHIR PICKNEY MRN: [id number] Date of Birth: Sep 14, 1956 Referring Provider:   Flowsheet Row Cardiac Rehab from 01/16/2022 in [facility] Cardiac and Pulmonary Rehab  Referring Provider Serafina Royals MD       Encounter Date: 01/23/2022  Check In:  Session Check In - 01/23/22 0909       Check-In   Supervising physician immediately available to respond to emergencies See telemetry face sheet for immediately available ER MD    Location [facility]-Cardiac & Pulmonary Rehab    Staff Present Birdie Sons, MPA, RN;Amanda Oletta Darter, BA, ACSM CEP, Exercise Physiologist    Virtual Visit No    Medication changes reported     No    Fall or balance concerns reported    No    Warm-up and Cool-down Performed on first and last piece of equipment    Resistance Training Performed Yes    VAD Patient? No    PAD/SET Patient? No      Pain Assessment   Currently in Pain? No/denies                Social History   Tobacco Use  Smoking Status Former   Packs/day: 0.50   Years: 40.00   Pack years: 20.00   Types: Cigarettes   Quit date: 11/29/2021   Years since quitting: 0.1  Smokeless Tobacco Former    Goals Met:  Independence with exercise equipment Exercise tolerated well No report of concerns or symptoms today Strength training completed today  Goals Unmet:  Not Applicable  Comments: Pt able to follow exercise prescription today without complaint.  Will continue to monitor for progression.    Dr. Emily Filbert is Medical Director for [place].  Dr. Ottie Glazier is Medical Director for [facility] Pulmonary Rehabilitation.

## 2022-01-28 ENCOUNTER — Other Ambulatory Visit: Payer: Self-pay

## 2022-01-28 DIAGNOSIS — Z951 Presence of aortocoronary bypass graft: Secondary | ICD-10-CM | POA: Diagnosis not present

## 2022-01-28 NOTE — Progress Notes (Signed)
Daily Session Note  Patient Details  Name: Charles Blake MRN: [id number] Date of Birth: June 25, 1956 Referring Provider:   Flowsheet Row Cardiac Rehab from 01/16/2022 in [facility] Cardiac and Pulmonary Rehab  Referring Provider Serafina Royals MD       Encounter Date: 01/28/2022  Check In:  Session Check In - 01/28/22 0920       Check-In   Supervising physician immediately available to respond to emergencies See telemetry face sheet for immediately available ER MD    Location [facility]-Cardiac & Pulmonary Rehab    Staff Present Birdie Sons, MPA, RN;Amanda Oletta Darter, BA, ACSM CEP, Exercise Physiologist;Jessica Luan Pulling, MA, RCEP, CCRP, CCET    Virtual Visit No    Medication changes reported     No    Fall or balance concerns reported    No    Warm-up and Cool-down Performed on first and last piece of equipment    Resistance Training Performed Yes    VAD Patient? No    PAD/SET Patient? No      Pain Assessment   Currently in Pain? No/denies                Social History   Tobacco Use  Smoking Status Former   Packs/day: 0.50   Years: 40.00   Pack years: 20.00   Types: Cigarettes   Quit date: 11/29/2021   Years since quitting: 0.1  Smokeless Tobacco Former    Goals Met:  Independence with exercise equipment Exercise tolerated well No report of concerns or symptoms today Strength training completed today  Goals Unmet:  Not Applicable  Comments: Pt able to follow exercise prescription today without complaint.  Will continue to monitor for progression.    Dr. Emily Filbert is Medical Director for [place].  Dr. Ottie Glazier is Medical Director for [facility] Pulmonary Rehabilitation.

## 2022-01-29 ENCOUNTER — Encounter: Payer: Self-pay | Admitting: *Deleted

## 2022-01-29 DIAGNOSIS — Z951 Presence of aortocoronary bypass graft: Secondary | ICD-10-CM

## 2022-01-29 NOTE — Progress Notes (Signed)
Cardiac Individual Treatment Plan  Patient Details  Name: Charles Blake MRN: [id number] Date of Birth: May 19, 1956 Referring Provider:   Flowsheet Row Cardiac Rehab from 01/16/2022 in [facility] Cardiac and Pulmonary Rehab  Referring Provider Serafina Royals MD       Initial Encounter Date:  Flowsheet Row Cardiac Rehab from 01/16/2022 in [facility] Cardiac and Pulmonary Rehab  Date 01/16/22       Visit Diagnosis: S/P CABG x 3  Patient's Home Medications on Admission:  Current Outpatient Medications:    acetaminophen (TYLENOL) 650 MG CR tablet, Take 650 mg by mouth every 8 (eight) hours as needed for pain., Disp: , Rfl:    albuterol (PROVENTIL HFA;VENTOLIN HFA) 108 (90 Base) MCG/ACT inhaler, Inhale 2 puffs into the lungs every 4 (four) hours as needed for wheezing., Disp: 1 Inhaler, Rfl: 0   amLODipine (NORVASC) 5 MG tablet, Take 5 mg by mouth daily., Disp: , Rfl:    aspirin EC 81 MG EC tablet, Take 1 tablet (81 mg total) by mouth daily. Swallow whole., Disp: , Rfl:    carvedilol (COREG) 6.25 MG tablet, Take 6.25 mg by mouth 2 (two) times daily with a meal., Disp: , Rfl:    clopidogrel (PLAVIX) 75 MG tablet, Take 1 tablet by mouth once daily (Patient taking differently: Take 75 mg by mouth every evening.), Disp: 90 tablet, Rfl: 0   escitalopram (LEXAPRO) 10 MG tablet, Take 10 mg by mouth in the morning., Disp: , Rfl:    furosemide (LASIX) 20 MG tablet, Take 20 mg by mouth daily., Disp: , Rfl:    heparin 25000 UT/250ML infusion, Inject 1,500 Units/hr into the vein continuous. (Patient not taking: Reported on 01/03/2022), Disp: , Rfl:    isosorbide mononitrate (IMDUR) 60 MG 24 hr tablet, Take 60 mg by mouth daily. (Patient not taking: Reported on 01/03/2022), Disp: , Rfl:    ketoconazole (NIZORAL) 2 % shampoo, as needed., Disp: , Rfl:    KLOR-CON M20 20 MEQ tablet, Take 40 mEq by mouth 2 (two) times daily., Disp: , Rfl:    lovastatin (MEVACOR) 40 MG tablet, Take 40 mg by mouth at bedtime., Disp: ,  Rfl:    lovastatin (MEVACOR) 40 MG tablet, Take 1 tablet by mouth daily with supper., Disp: , Rfl:    Melatonin 3 MG CAPS, Take 3 mg by mouth at bedtime., Disp: , Rfl:    metFORMIN (GLUCOPHAGE) 500 MG tablet, Take 500 mg by mouth 2 (two) times daily., Disp: , Rfl:    Multiple Vitamin (MULTI-VITAMINS) TABS, Take 1 tablet by mouth daily., Disp: , Rfl:    niacin (SLO-NIACIN) 500 MG tablet, Take 500 mg by mouth at bedtime., Disp: , Rfl:    nitroGLYCERIN (NITROSTAT) 0.4 MG SL tablet, Place 1 tablet (0.4 mg total) under the tongue every 5 (five) minutes as needed for chest pain. (Patient not taking: Reported on 01/03/2022), Disp: , Rfl:    pantoprazole (PROTONIX) 40 MG tablet, Take 40 mg by mouth daily., Disp: , Rfl:    telmisartan-hydrochlorothiazide (MICARDIS HCT) 80-12.5 MG tablet, Take 1 tablet by mouth daily. (Patient not taking: Reported on 01/03/2022), Disp: , Rfl: 3   umeclidinium-vilanterol (ANORO ELLIPTA) 62.5-25 MCG/ACT AEPB, Inhale 1 puff into the lungs daily., Disp: , Rfl:   Past Medical History: Past Medical History:  Diagnosis Date   Anemia    Anxiety    Arthritis    fingers and hands   Cancer ([place])    skin cancer   COPD (chronic obstructive pulmonary  disease) ([place])    wheezing   Coronary artery disease    Dyspnea    GERD (gastroesophageal reflux disease)    Headache    2-3 times/ week   History of hiatal hernia    HOH (hard of hearing)    Hypercholesteremia    Hypertension    controlled   Pneumonia    Sleep apnea    CPAP   Wears dentures    upper    Tobacco Use: Social History   Tobacco Use  Smoking Status Former   Packs/day: 0.50   Years: 40.00   Pack years: 20.00   Types: Cigarettes   Quit date: 11/29/2021   Years since quitting: 0.1  Smokeless Tobacco Former    Labs: Recent Chemical engineer   There is no flowsheet data to display.      Exercise Target Goals: Exercise Program Goal: Individual exercise prescription set using results from  initial 6 min walk test and THRR while considering  patients activity barriers and safety.   Exercise Prescription Goal: Initial exercise prescription builds to 30-45 minutes a day of aerobic activity, 2-3 days per week.  Home exercise guidelines will be given to patient during program as part of exercise prescription that the participant will acknowledge.   Education: Aerobic Exercise: - Group verbal and visual presentation on the components of exercise prescription. Introduces F.I.T.T principle from ACSM for exercise prescriptions.  Reviews F.I.T.T. principles of aerobic exercise including progression. Written material given at graduation. Flowsheet Row Cardiac Rehab from 01/23/2022 in [facility] Cardiac and Pulmonary Rehab  Education need identified 01/16/22       Education: Resistance Exercise: - Group verbal and visual presentation on the components of exercise prescription. Introduces F.I.T.T principle from ACSM for exercise prescriptions  Reviews F.I.T.T. principles of resistance exercise including progression. Written material given at graduation.    Education: Exercise & Equipment Safety: - Individual verbal instruction and demonstration of equipment use and safety with use of the equipment. Flowsheet Row Cardiac Rehab from 01/23/2022 in [facility] Cardiac and Pulmonary Rehab  Date 01/16/22  Educator [facility]  Instruction Review Code 1- Verbalizes Understanding       Education: Exercise Physiology & General Exercise Guidelines: - Group verbal and written instruction with models to review the exercise physiology of the cardiovascular system and associated critical values. Provides general exercise guidelines with specific guidelines to those with heart or lung disease.    Education: Flexibility, Balance, Mind/Body Relaxation: - Group verbal and visual presentation with interactive activity on the components of exercise prescription. Introduces F.I.T.T principle from ACSM for exercise  prescriptions. Reviews F.I.T.T. principles of flexibility and balance exercise training including progression. Also discusses the mind body connection.  Reviews various relaxation techniques to help reduce and manage stress (i.e. Deep breathing, progressive muscle relaxation, and visualization). Balance handout provided to take home. Written material given at graduation.   Activity Barriers & Risk Stratification:  Activity Barriers & Cardiac Risk Stratification - 01/16/22 1045       Activity Barriers & Cardiac Risk Stratification   Activity Barriers Other (comment);Muscular Weakness;Deconditioning;Balance Concerns;Shortness of Breath    Comments PAD w/ stent- can slow him down some    Cardiac Risk Stratification High             6 Minute Walk:  6 Minute Walk     Row Name 01/16/22 1044         6 Minute Walk   Phase Initial     Distance 960 feet  Walk Time 6 minutes     # of Rest Breaks 0     MPH 1.82     METS 2.4     RPE 17     Perceived Dyspnea  2     VO2 Peak 8.4     Symptoms Yes (comment)     Comments PAD claudication (6/10, CPS 4/5), SOB     Resting HR 65 bpm     Resting BP 114/64     Resting Oxygen Saturation  97 %     Exercise Oxygen Saturation  during 6 min walk 96 %     Max Ex. HR 108 bpm     Max Ex. BP 142/84     2 Minute Post BP 134/60              Oxygen Initial Assessment:   Oxygen Re-Evaluation:   Oxygen Discharge (Final Oxygen Re-Evaluation):   Initial Exercise Prescription:  Initial Exercise Prescription - 01/16/22 1000       Date of Initial Exercise RX and Referring Provider   Date 01/16/22    Referring Provider Serafina Royals MD      Oxygen   Maintain Oxygen Saturation 88% or higher      Treadmill   MPH 1.7    Grade 0    Minutes 15    METs 2.3      Recumbant Bike   Level 1    RPM 50    Watts 12    Minutes 15    METs 2      REL-XR   Level 1    Speed 50    Minutes 15    METs 2      T5 Nustep   Level 1    SPM  80    Minutes 15    METs 2      Track   Laps 23    Minutes 15    METs 2.25      Prescription Details   Frequency (times per week) 2    Duration Progress to 30 minutes of continuous aerobic without signs/symptoms of physical distress      Intensity   THRR 40-80% of Max Heartrate 101-136    Ratings of Perceived Exertion 11-13    Perceived Dyspnea 0-4      Progression   Progression Continue to progress workloads to maintain intensity without signs/symptoms of physical distress.      Resistance Training   Training Prescription Yes    Weight 4 lb    Reps 10-15             Perform Capillary Blood Glucose checks as needed.  Exercise Prescription Changes:   Exercise Prescription Changes     Row Name 01/16/22 1000             Response to Exercise   Blood Pressure (Admit) 114/64       Blood Pressure (Exercise) 142/84       Blood Pressure (Exit) 134/60       Heart Rate (Admit) 65 bpm       Heart Rate (Exercise) 108 bpm       Heart Rate (Exit) 76 bpm       Oxygen Saturation (Admit) 97 %       Oxygen Saturation (Exercise) 96 %       Rating of Perceived Exertion (Exercise) 17       Perceived Dyspnea (Exercise) 2       Symptoms SOB,  claudication pain (6/10, 4/5)       Comments walk test results                Exercise Comments:   Exercise Comments     Row Name 01/21/22 0909           Exercise Comments First full day of exercise!  Patient was oriented to gym and equipment including functions, settings, policies, and procedures.  Patient's individual exercise prescription and treatment plan were reviewed.  All starting workloads were established based on the results of the 6 minute walk test done at initial orientation visit.  The plan for exercise progression was also introduced and progression will be customized based on patient's performance and goals.                Exercise Goals and Review:   Exercise Goals     Row Name 01/16/22 1048              Exercise Goals   Increase Physical Activity Yes       Intervention Provide advice, education, support and counseling about physical activity/exercise needs.;Develop an individualized exercise prescription for aerobic and resistive training based on initial evaluation findings, risk stratification, comorbidities and participant's personal goals.       Expected Outcomes Long Term: Exercising regularly at least 3-5 days a week.;Long Term: Add in home exercise to make exercise part of routine and to increase amount of physical activity.;Short Term: Attend rehab on a regular basis to increase amount of physical activity.       Increase Strength and Stamina Yes       Intervention Provide advice, education, support and counseling about physical activity/exercise needs.;Develop an individualized exercise prescription for aerobic and resistive training based on initial evaluation findings, risk stratification, comorbidities and participant's personal goals.       Expected Outcomes Short Term: Increase workloads from initial exercise prescription for resistance, speed, and METs.;Short Term: Perform resistance training exercises routinely during rehab and add in resistance training at home;Long Term: Improve cardiorespiratory fitness, muscular endurance and strength as measured by increased METs and functional capacity (6MWT)       Able to understand and use rate of perceived exertion (RPE) scale Yes       Intervention Provide education and explanation on how to use RPE scale       Expected Outcomes Short Term: Able to use RPE daily in rehab to express subjective intensity level;Long Term:  Able to use RPE to guide intensity level when exercising independently       Able to understand and use Dyspnea scale Yes       Intervention Provide education and explanation on how to use Dyspnea scale       Expected Outcomes Short Term: Able to use Dyspnea scale daily in rehab to express subjective sense of shortness  of breath during exertion;Long Term: Able to use Dyspnea scale to guide intensity level when exercising independently       Knowledge and understanding of Target Heart Rate Range (THRR) Yes       Intervention Provide education and explanation of THRR including how the numbers were predicted and where they are located for reference       Expected Outcomes Short Term: Able to state/look up THRR;Long Term: Able to use THRR to govern intensity when exercising independently;Short Term: Able to use daily as guideline for intensity in rehab       Able to check pulse independently Yes  Intervention Provide education and demonstration on how to check pulse in carotid and radial arteries.;Review the importance of being able to check your own pulse for safety during independent exercise       Expected Outcomes Short Term: Able to explain why pulse checking is important during independent exercise;Long Term: Able to check pulse independently and accurately       Understanding of Exercise Prescription Yes       Intervention Provide education, explanation, and written materials on patient's individual exercise prescription       Expected Outcomes Short Term: Able to explain program exercise prescription;Long Term: Able to explain home exercise prescription to exercise independently                Exercise Goals Re-Evaluation :  Exercise Goals Re-Evaluation     Row Name 01/21/22 0909 01/28/22 1312           Exercise Goal Re-Evaluation   Exercise Goals Review Increase Physical Activity;Able to understand and use rate of perceived exertion (RPE) scale;Knowledge and understanding of Target Heart Rate Range (THRR);Understanding of Exercise Prescription;Increase Strength and Stamina;Able to understand and use Dyspnea scale;Able to check pulse independently Increase Physical Activity;Increase Strength and Stamina;Able to understand and use rate of perceived exertion (RPE) scale;Knowledge and understanding  of Target Heart Rate Range (THRR);Able to check pulse independently      Comments Reviewed RPE and dyspnea scales, THR and program prescription with pt today.  Pt voiced understanding and was given a copy of goals to take home. Reviewed home exercise with pt today.  Pt plans to get a bike or TM for home, consider a gym for exercise.  Reviewed THR, pulse, RPE, sign and symptoms, pulse oximetery and when to call 911 or MD.  Also discussed weather considerations and indoor options.  Pt voiced understanding.      Expected Outcomes Short: Use RPE daily to regulate intensity. Long: Follow program prescription in THR. Short: monitor HR when exercising at home Long: become independent with exercise               Discharge Exercise Prescription (Final Exercise Prescription Changes):  Exercise Prescription Changes - 01/16/22 1000       Response to Exercise   Blood Pressure (Admit) 114/64    Blood Pressure (Exercise) 142/84    Blood Pressure (Exit) 134/60    Heart Rate (Admit) 65 bpm    Heart Rate (Exercise) 108 bpm    Heart Rate (Exit) 76 bpm    Oxygen Saturation (Admit) 97 %    Oxygen Saturation (Exercise) 96 %    Rating of Perceived Exertion (Exercise) 17    Perceived Dyspnea (Exercise) 2    Symptoms SOB, claudication pain (6/10, 4/5)    Comments walk test results             Nutrition:  Target Goals: Understanding of nutrition guidelines, daily intake of sodium 1500mg , cholesterol 200mg , calories 30% from fat and 7% or less from saturated fats, daily to have 5 or more servings of fruits and vegetables.  Education: All About Nutrition: -Group instruction provided by verbal, written material, interactive activities, discussions, models, and posters to present general guidelines for heart healthy nutrition including fat, fiber, MyPlate, the role of sodium in heart healthy nutrition, utilization of the nutrition label, and utilization of this knowledge for meal planning. Follow up email  sent as well. Written material given at graduation. Flowsheet Row Cardiac Rehab from 01/23/2022 in [facility] Cardiac and Pulmonary Rehab  Education need identified 01/16/22       Biometrics:  Pre Biometrics - 01/16/22 1049       Pre Biometrics   Height 5' 8.4" (1.737 m)    Weight 217 lb 9.6 oz (98.7 kg)    BMI (Calculated) 32.71    Single Leg Stand 5.2 seconds              Nutrition Therapy Plan and Nutrition Goals:  Nutrition Therapy & Goals - 01/16/22 1049       Intervention Plan   Intervention Prescribe, educate and counsel regarding individualized specific dietary modifications aiming towards targeted core components such as weight, hypertension, lipid management, diabetes, heart failure and other comorbidities.    Expected Outcomes Short Term Goal: Understand basic principles of dietary content, such as calories, fat, sodium, cholesterol and nutrients.;Short Term Goal: A plan has been developed with personal nutrition goals set during dietitian appointment.;Long Term Goal: Adherence to prescribed nutrition plan.             Nutrition Assessments:  MEDIFICTS Score Key: ?70 Need to make dietary changes  40-70 Heart Healthy Diet ? 40 Therapeutic Level Cholesterol Diet  Flowsheet Row Cardiac Rehab from 01/16/2022 in [facility] Cardiac and Pulmonary Rehab  Picture Your Plate Total Score on Admission 58      Picture Your Plate Scores: <85 Unhealthy dietary pattern with much room for improvement. 41-50 Dietary pattern unlikely to meet recommendations for good health and room for improvement. 51-60 More healthful dietary pattern, with some room for improvement.  >60 Healthy dietary pattern, although there may be some specific behaviors that could be improved.    Nutrition Goals Re-Evaluation:   Nutrition Goals Discharge (Final Nutrition Goals Re-Evaluation):   Psychosocial: Target Goals: Acknowledge presence or absence of significant depression and/or stress, maximize  coping skills, provide positive support system. Participant is able to verbalize types and ability to use techniques and skills needed for reducing stress and depression.   Education: Stress, Anxiety, and Depression - Group verbal and visual presentation to define topics covered.  Reviews how body is impacted by stress, anxiety, and depression.  Also discusses healthy ways to reduce stress and to treat/manage anxiety and depression.  Written material given at graduation. Flowsheet Row Cardiac Rehab from 01/23/2022 in [facility] Cardiac and Pulmonary Rehab  Date 01/23/22  Educator [facility]  Instruction Review Code 1- [employer] Understanding       Education: Sleep Hygiene -Provides group verbal and written instruction about how sleep can affect your health.  Define sleep hygiene, discuss sleep cycles and impact of sleep habits. Review good sleep hygiene tips.    Initial Review & Psychosocial Screening:  Initial Psych Review & Screening - 01/03/22 1312       Initial Review   Current issues with Current Stress Concerns    Source of Stress Concerns Unable to participate in former interests or hobbies;Unable to perform yard/household activities      [place]? Yes   wife     Barriers   Psychosocial barriers to participate in program The patient should benefit from training in stress management and relaxation.;There are no identifiable barriers or psychosocial needs.      Screening Interventions   Interventions Encouraged to exercise;Provide feedback about the scores to participant;To provide support and resources with identified psychosocial needs    Expected Outcomes Short Term goal: Utilizing psychosocial counselor, staff and physician to assist with identification of specific Stressors or current issues interfering with healing  process. Setting desired goal for each stressor or current issue identified.;Long Term Goal: Stressors or current issues are controlled or  eliminated.;Short Term goal: Identification and review with participant of any Quality of Life or Depression concerns found by scoring the questionnaire.;Long Term goal: The participant improves quality of Life and PHQ9 Scores as seen by post scores and/or verbalization of changes             Quality of Life Scores:   Quality of Life - 01/16/22 1049       Quality of Life   Select Quality of Life      Quality of Life Scores   Health/Function Pre 19.9 %    Socioeconomic Pre 24.86 %    Psych/Spiritual Pre 24.64 %    Family Pre 23.6 %    GLOBAL Pre 22.44 %            Scores of 19 and below usually indicate a poorer quality of life in these areas.  A difference of  2-3 points is a clinically meaningful difference.  A difference of 2-3 points in the total score of the Quality of Life Index has been associated with significant improvement in overall quality of life, self-image, physical symptoms, and general health in studies assessing change in quality of life.  PHQ-9: Recent Review Flowsheet Data     Depression screen [facility] 2/9 01/16/2022   Decreased Interest 1   Down, Depressed, Hopeless 0   PHQ - 2 Score 1   Altered sleeping 1   Tired, decreased energy 1   Change in appetite 1   Feeling bad or failure about yourself  0   Trouble concentrating 0   Moving slowly or fidgety/restless 0   Suicidal thoughts 0   PHQ-9 Score 4   Difficult doing work/chores Not difficult at all      Interpretation of Total Score  Total Score Depression Severity:  1-4 = Minimal depression, 5-9 = Mild depression, 10-14 = Moderate depression, 15-19 = Moderately severe depression, 20-27 = Severe depression   Psychosocial Evaluation and Intervention:  Psychosocial Evaluation - 01/03/22 1320       Psychosocial Evaluation & Interventions   Interventions Encouraged to exercise with the program and follow exercise prescription;Relaxation education    Comments Charles Blake reports doing well after his CABG  x 3, however he is ready to get back to his usual activities (outdoor chores, lifting, etc.). He saw his surgeon two days ago and received a good report. He has noticed his arm muscles have decreased significantly and wants to gain some muscle tone back. He and his wife are understanding that this is part of the process in that he has to wait for his chest to heal before increasing activity loads. He is ready to get started in the program so he can gain some stamina back.    Expected Outcomes Short; attend cardiac rehab for education and exercise. Long: develop and maintain positive self care habits.    Continue Psychosocial Services  Follow up required by staff             Psychosocial Re-Evaluation:   Psychosocial Discharge (Final Psychosocial Re-Evaluation):   Vocational Rehabilitation: Provide vocational rehab assistance to qualifying candidates.   Vocational Rehab Evaluation & Intervention:  Vocational Rehab - 01/03/22 1312       Initial Vocational Rehab Evaluation & Intervention   Assessment shows need for Vocational Rehabilitation No             Education:  Education Goals: Education classes will be provided on a variety of topics geared toward better understanding of heart health and risk factor modification. Participant will state understanding/return demonstration of topics presented as noted by education test scores.  Learning Barriers/Preferences:  Learning Barriers/Preferences - 01/03/22 1312       Learning Barriers/Preferences   Learning Barriers None    Learning Preferences Individual Instruction             General Cardiac Education Topics:  AED/CPR: - Group verbal and written instruction with the use of models to demonstrate the basic use of the AED with the basic ABC's of resuscitation.   Anatomy and Cardiac Procedures: - Group verbal and visual presentation and models provide information about basic cardiac anatomy and function. Reviews the  testing methods done to diagnose heart disease and the outcomes of the test results. Describes the treatment choices: Medical Management, Angioplasty, or Coronary Bypass Surgery for treating various heart conditions including Myocardial Infarction, Angina, Valve Disease, and Cardiac Arrhythmias.  Written material given at graduation. Flowsheet Row Cardiac Rehab from 01/23/2022 in [facility] Cardiac and Pulmonary Rehab  Education need identified 01/16/22       Medication Safety: - Group verbal and visual instruction to review commonly prescribed medications for heart and lung disease. Reviews the medication, class of the drug, and side effects. Includes the steps to properly store meds and maintain the prescription regimen.  Written material given at graduation.   Intimacy: - Group verbal instruction through game format to discuss how heart and lung disease can affect sexual intimacy. Written material given at graduation..   Know Your Numbers and Heart Failure: - Group verbal and visual instruction to discuss disease risk factors for cardiac and pulmonary disease and treatment options.  Reviews associated critical values for Overweight/Obesity, Hypertension, Cholesterol, and Diabetes.  Discusses basics of heart failure: signs/symptoms and treatments.  Introduces Heart Failure Zone chart for action plan for heart failure.  Written material given at graduation.   Infection Prevention: - Provides verbal and written material to individual with discussion of infection control including proper hand washing and proper equipment cleaning during exercise session. Flowsheet Row Cardiac Rehab from 01/23/2022 in [facility] Cardiac and Pulmonary Rehab  Date 01/16/22  Educator [facility]  Instruction Review Code 1- Verbalizes Understanding       Falls Prevention: - Provides verbal and written material to individual with discussion of falls prevention and safety. Flowsheet Row Cardiac Rehab from 01/23/2022 in [facility] Cardiac  and Pulmonary Rehab  Date 01/16/22  Educator [facility]  Instruction Review Code 1- Verbalizes Understanding       Other: -Provides group and verbal instruction on various topics (see comments)   Knowledge Questionnaire Score:  Knowledge Questionnaire Score - 01/16/22 1050       Knowledge Questionnaire Score   Pre Score 22/26             Core Components/Risk Factors/Patient Goals at Admission:  Personal Goals and Risk Factors at Admission - 01/16/22 1050       Core Components/Risk Factors/Patient Goals on Admission    Weight Management Yes;Obesity;Weight Loss    Intervention Weight Management: Develop a combined nutrition and exercise program designed to reach desired caloric intake, while maintaining appropriate intake of nutrient and fiber, sodium and fats, and appropriate energy expenditure required for the weight goal.;Weight Management: Provide education and appropriate resources to help participant work on and attain dietary goals.;Weight Management/Obesity: Establish reasonable short term and long term weight goals.;Obesity: Provide education and appropriate  resources to help participant work on and attain dietary goals.    Admit Weight 217 lb 9.6 oz (98.7 kg)    Goal Weight: Short Term 212 lb (96.2 kg)    Goal Weight: Long Term 205 lb (93 kg)    Expected Outcomes Short Term: Continue to assess and modify interventions until short term weight is achieved;Long Term: Adherence to nutrition and physical activity/exercise program aimed toward attainment of established weight goal;Weight Loss: Understanding of general recommendations for a balanced deficit meal plan, which promotes 1-2 lb weight loss per week and includes a negative energy balance of [phone] kcal/d;Understanding recommendations for meals to include 15-35% energy as protein, 25-35% energy from fat, 35-60% energy from carbohydrates, less than 200mg  of dietary cholesterol, 20-35 gm of total fiber daily;Understanding of  distribution of calorie intake throughout the day with the consumption of 4-5 meals/snacks    Tobacco Cessation Yes    Number of packs per day quit 11/29/21    Intervention Offer self-teaching materials, assist with locating and accessing local/national Quit Smoking programs, and support quit date choice.    Expected Outcomes Short Term: Will demonstrate readiness to quit, by selecting a quit date.;Short Term: Will quit all tobacco product use, adhering to prevention of relapse plan.;Long Term: Complete abstinence from all tobacco products for at least 12 months from quit date.    Diabetes Yes    Intervention Provide education about signs/symptoms and action to take for hypo/hyperglycemia.;Provide education about proper nutrition, including hydration, and aerobic/resistive exercise prescription along with prescribed medications to achieve blood glucose in normal ranges: Fasting glucose 65-99 mg/dL    Expected Outcomes Short Term: Participant verbalizes understanding of the signs/symptoms and immediate care of hyper/hypoglycemia, proper foot care and importance of medication, aerobic/resistive exercise and nutrition plan for blood glucose control.;Long Term: Attainment of HbA1C < 7%.    Hypertension Yes    Intervention Provide education on lifestyle modifcations including regular physical activity/exercise, weight management, moderate sodium restriction and increased consumption of fresh fruit, vegetables, and low fat dairy, alcohol moderation, and smoking cessation.;Monitor prescription use compliance.    Expected Outcomes Short Term: Continued assessment and intervention until BP is < 140/1mm HG in hypertensive participants. < 130/4mm HG in hypertensive participants with diabetes, heart failure or chronic kidney disease.;Long Term: Maintenance of blood pressure at goal levels.    Lipids Yes    Intervention Provide education and support for participant on nutrition & aerobic/resistive exercise along  with prescribed medications to achieve LDL 70mg , HDL >40mg .    Expected Outcomes Short Term: Participant states understanding of desired cholesterol values and is compliant with medications prescribed. Participant is following exercise prescription and nutrition guidelines.;Long Term: Cholesterol controlled with medications as prescribed, with individualized exercise RX and with personalized nutrition plan. Value goals: LDL < 70mg , HDL > 40 mg.             Education:Diabetes - Individual verbal and written instruction to review signs/symptoms of diabetes, desired ranges of glucose level fasting, after meals and with exercise. Acknowledge that pre and post exercise glucose checks will be done for 3 sessions at entry of program.   Core Components/Risk Factors/Patient Goals Review:   Goals and Risk Factor Review     Row Name 01/16/22 1054             Core Components/Risk Factors/Patient Goals Review   Personal Goals Review Tobacco Cessation       Review Charles Blake has recently quit tobacco use within the last 6 months.  Intervention for relapse prevention was provided at the initial medical review. He was encouraged to continue to with tobacco cessation and was provided information on relapse prevention. Patient talked about about combination therapy, tobacco cessation classes, quit line, and quit smoking apps in case of a relapse and information is available. Patient demonstrated understanding of this material.Staff will continue to provide encouragement and follow up with the patient throughout the program.       Expected Outcomes Short; Continued cessation Long: Completed cessation                Core Components/Risk Factors/Patient Goals at Discharge (Final Review):   Goals and Risk Factor Review - 01/16/22 1054       Core Components/Risk Factors/Patient Goals Review   Personal Goals Review Tobacco Cessation    Review Charles Blake has recently quit tobacco use within the last 6 months.  Intervention for relapse prevention was provided at the initial medical review. He was encouraged to continue to with tobacco cessation and was provided information on relapse prevention. Patient talked about about combination therapy, tobacco cessation classes, quit line, and quit smoking apps in case of a relapse and information is available. Patient demonstrated understanding of this material.Staff will continue to provide encouragement and follow up with the patient throughout the program.    Expected Outcomes Short; Continued cessation Long: Completed cessation             ITP Comments:  ITP Comments     Row Name 01/03/22 1349 01/16/22 1042 01/21/22 0909 01/29/22 0928     ITP Comments Initial telephone orientation completed. Diagnosis can be found in [facility] 12/23. EP orientation scheduled for Thursday, February 9th at 8am. Completed 6MWT and gym orientation. Initial ITP created and sent for review to Dr. Emily Filbert, Medical Director. First full day of exercise!  Patient was oriented to gym and equipment including functions, settings, policies, and procedures.  Patient's individual exercise prescription and treatment plan were reviewed.  All starting workloads were established based on the results of the 6 minute walk test done at initial orientation visit.  The plan for exercise progression was also introduced and progression will be customized based on patient's performance and goals. 30 Day review completed. Medical Director ITP review done, changes made as directed, and signed approval by Medical Director.   new to program             Comments:

## 2022-01-30 ENCOUNTER — Other Ambulatory Visit: Payer: Self-pay

## 2022-01-30 DIAGNOSIS — Z951 Presence of aortocoronary bypass graft: Secondary | ICD-10-CM

## 2022-01-30 NOTE — Progress Notes (Signed)
Daily Session Note  Patient Details  Name: Charles Blake MRN: [id number] Date of Birth: 10-24-1956 Referring Provider:   Flowsheet Row Cardiac Rehab from 01/16/2022 in [facility] Cardiac and Pulmonary Rehab  Referring Provider Serafina Royals MD       Encounter Date: 01/30/2022  Check In:  Session Check In - 01/30/22 0920       Check-In   Supervising physician immediately available to respond to emergencies See telemetry face sheet for immediately available ER MD    Location [facility]-Cardiac & Pulmonary Rehab    Staff Present Birdie Sons, MPA, RN;Melissa Ugashik, RDN, Wilhelmina Mcardle, BS, ACSM CEP, Exercise Physiologist    Virtual Visit No    Medication changes reported     Yes    Comments decreased carvedilol to $RemoveBefor'6mg'pZJReFqDeRXN$  twice daily    Fall or balance concerns reported    No    Warm-up and Cool-down Performed on first and last piece of equipment    Resistance Training Performed Yes    VAD Patient? No    PAD/SET Patient? No      Pain Assessment   Currently in Pain? No/denies                Social History   Tobacco Use  Smoking Status Former   Packs/day: 0.50   Years: 40.00   Pack years: 20.00   Types: Cigarettes   Quit date: 11/29/2021   Years since quitting: 0.1  Smokeless Tobacco Former    Goals Met:  Independence with exercise equipment Exercise tolerated well No report of concerns or symptoms today Strength training completed today  Goals Unmet:  Not Applicable  Comments: Pt able to follow exercise prescription today without complaint.  Will continue to monitor for progression.    Dr. Emily Filbert is Medical Director for [place].  Dr. Ottie Glazier is Medical Director for [facility] Pulmonary Rehabilitation.

## 2022-02-04 ENCOUNTER — Other Ambulatory Visit: Payer: Self-pay

## 2022-02-04 DIAGNOSIS — Z951 Presence of aortocoronary bypass graft: Secondary | ICD-10-CM

## 2022-02-04 NOTE — Progress Notes (Signed)
Daily Session Note  Patient Details  Name: Charles Blake MRN: [id number] Date of Birth: [birth year] Referring Provider:   Flowsheet Row Cardiac Rehab from 01/16/2022 in [facility] Cardiac and Pulmonary Rehab  Referring Provider Serafina Royals MD       Encounter Date: 02/04/2022  Check In:  Session Check In - 02/04/22 0910       Check-In   Supervising physician immediately available to respond to emergencies See telemetry face sheet for immediately available ER MD    Location [facility]-Cardiac & Pulmonary Rehab    Staff Present Birdie Sons, MPA, RN;Amanda Sommer, BA, ACSM CEP, Exercise Physiologist;Melissa Caiola, RDN, LDN    Virtual Visit No    Medication changes reported     Yes    Comments actual dose of carvedilol 6.$RemoveBeforeD'25mg'JBBoeWznzhYUXI$  twice daily; added $RemoveBefo'160mg'FeCLFtgqMda$  valsartan daily,  and pt not taking temisartan    Fall or balance concerns reported    No    Warm-up and Cool-down Performed on first and last piece of equipment    Resistance Training Performed Yes    VAD Patient? No    PAD/SET Patient? No      Pain Assessment   Currently in Pain? No/denies                Social History   Tobacco Use  Smoking Status Former   Packs/day: 0.50   Years: 40.00   Pack years: 20.00   Types: Cigarettes   Quit date: 11/29/2021   Years since quitting: 0.1  Smokeless Tobacco Former    Goals Met:  Independence with exercise equipment Exercise tolerated well No report of concerns or symptoms today Strength training completed today  Goals Unmet:  Not Applicable  Comments: Pt able to follow exercise prescription today without complaint.  Will continue to monitor for progression.    Dr. Emily Filbert is Medical Director for [place].  Dr. Ottie Glazier is Medical Director for [facility] Pulmonary Rehabilitation.

## 2022-02-06 ENCOUNTER — Other Ambulatory Visit: Payer: Self-pay

## 2022-02-06 ENCOUNTER — Encounter: Payer: Medicare Other | Attending: Internal Medicine

## 2022-02-06 DIAGNOSIS — Z951 Presence of aortocoronary bypass graft: Secondary | ICD-10-CM

## 2022-02-06 NOTE — Progress Notes (Signed)
Daily Session Note ? ?Patient Details  ?Name: Charles Blake ?MRN: [id number] ?Date of Birth: 1956/11/18 ?Referring Provider:   ?Flowsheet Row Cardiac Rehab from 01/16/2022 in [facility] Cardiac and Pulmonary Rehab  ?Referring Provider Serafina Royals MD  ? ?  ? ? ?Encounter Date: 02/06/2022 ? ?Check In: ? Session Check In - 02/06/22 0929   ? ?  ? Check-In  ? Supervising physician immediately available to respond to emergencies See telemetry face sheet for immediately available ER MD   ? Location [facility]-Cardiac & Pulmonary Rehab   ? Staff Present Birdie Sons, MPA, RN;Lorely Bubb Amedeo Plenty, BS, ACSM CEP, Exercise Physiologist;Jessica Luan Pulling, MA, RCEP, CCRP, CCET   ? Virtual Visit No   ? Medication changes reported     No   ? Fall or balance concerns reported    No   ? Warm-up and Cool-down Performed on first and last piece of equipment   ? Resistance Training Performed Yes   ? VAD Patient? No   ? PAD/SET Patient? No   ?  ? Pain Assessment  ? Currently in Pain? No/denies   ? ?  ?  ? ?  ? ? ? ? ? ?Social History  ? ?Tobacco Use  ?Smoking Status Former  ? Packs/day: 0.50  ? Years: 40.00  ? Pack years: 20.00  ? Types: Cigarettes  ? Quit date: 11/29/2021  ? Years since quitting: 0.1  ?Smokeless Tobacco Former  ? ? ?Goals Met:  ?Independence with exercise equipment ?Exercise tolerated well ?Personal goals reviewed ?No report of concerns or symptoms today ?Strength training completed today ? ?Goals Unmet:  ?Not Applicable ? ?Comments: Pt able to follow exercise prescription today without complaint.  Will continue to monitor for progression. ? ? ? ?Dr. Emily Filbert is Medical Director for [place].  ?Dr. Ottie Glazier is Medical Director for [facility] Pulmonary Rehabilitation. ?

## 2022-02-06 NOTE — Progress Notes (Signed)
Completed initial RD consultation ?

## 2022-02-11 ENCOUNTER — Other Ambulatory Visit: Payer: Self-pay

## 2022-02-11 DIAGNOSIS — Z951 Presence of aortocoronary bypass graft: Secondary | ICD-10-CM | POA: Diagnosis not present

## 2022-02-11 NOTE — Progress Notes (Signed)
Daily Session Note ? ?Patient Details  ?Name: Charles Blake ?MRN: [id number] ?Date of Birth: 07-Nov-1956 ?Referring Provider:   ?Flowsheet Row Cardiac Rehab from 01/16/2022 in [facility] Cardiac and Pulmonary Rehab  ?Referring Provider Serafina Royals MD  ? ?  ? ? ?Encounter Date: 02/11/2022 ? ?Check In: ? Session Check In - 02/11/22 0921   ? ?  ? Check-In  ? Supervising physician immediately available to respond to emergencies See telemetry face sheet for immediately available ER MD   ? Location [facility]-Cardiac & Pulmonary Rehab   ? Staff Present Birdie Sons, MPA, RN;Jessica [place], MA, RCEP, CCRP, CCET;Amanda Sommer, BA, ACSM CEP, Exercise Physiologist   ? Virtual Visit No   ? Medication changes reported     No   ? Fall or balance concerns reported    No   ? Warm-up and Cool-down Performed on first and last piece of equipment   ? Resistance Training Performed Yes   ? VAD Patient? No   ? PAD/SET Patient? No   ?  ? Pain Assessment  ? Currently in Pain? No/denies   ? ?  ?  ? ?  ? ? ? ? ? ?Social History  ? ?Tobacco Use  ?Smoking Status Former  ? Packs/day: 0.50  ? Years: 40.00  ? Pack years: 20.00  ? Types: Cigarettes  ? Quit date: 11/29/2021  ? Years since quitting: 0.2  ?Smokeless Tobacco Former  ? ? ?Goals Met:  ?Independence with exercise equipment ?Exercise tolerated well ?No report of concerns or symptoms today ?Strength training completed today ? ?Goals Unmet:  ?Not Applicable ? ?Comments: Pt able to follow exercise prescription today without complaint.  Will continue to monitor for progression. ? ? ? ?Dr. Emily Filbert is Medical Director for [place].  ?Dr. Ottie Glazier is Medical Director for [facility] Pulmonary Rehabilitation. ?

## 2022-02-13 ENCOUNTER — Other Ambulatory Visit: Payer: Self-pay

## 2022-02-13 DIAGNOSIS — Z951 Presence of aortocoronary bypass graft: Secondary | ICD-10-CM | POA: Diagnosis not present

## 2022-02-13 NOTE — Progress Notes (Signed)
Daily Session Note ? ?Patient Details  ?Name: Charles Blake ?MRN: [id number] ?Date of Birth: Nov 22, 1956 ?Referring Provider:   ?Flowsheet Row Cardiac Rehab from 01/16/2022 in [facility] Cardiac and Pulmonary Rehab  ?Referring Provider Serafina Royals MD  ? ?  ? ? ?Encounter Date: 02/13/2022 ? ?Check In: ? Session Check In - 02/13/22 0927   ? ?  ? Check-In  ? Supervising physician immediately available to respond to emergencies See telemetry face sheet for immediately available ER MD   ? Location [facility]-Cardiac & Pulmonary Rehab   ? Staff Present Birdie Sons, MPA, RN;Kelly Amedeo Plenty, BS, ACSM CEP, Exercise Physiologist;Amanda Oletta Darter, BA, ACSM CEP, Exercise Physiologist;Jessica [place], MA, RCEP, CCRP, Marylynn Pearson, MS, ASCM CEP, Exercise Physiologist   ? Virtual Visit No   ? Medication changes reported     No   ? Fall or balance concerns reported    No   ? Warm-up and Cool-down Performed on first and last piece of equipment   ? Resistance Training Performed Yes   ? VAD Patient? No   ? PAD/SET Patient? No   ?  ? Pain Assessment  ? Currently in Pain? No/denies   ? ?  ?  ? ?  ? ? ? ? ? ?Social History  ? ?Tobacco Use  ?Smoking Status Former  ? Packs/day: 0.50  ? Years: 40.00  ? Pack years: 20.00  ? Types: Cigarettes  ? Quit date: 11/29/2021  ? Years since quitting: 0.2  ?Smokeless Tobacco Former  ? ? ?Goals Met:  ?Independence with exercise equipment ?Exercise tolerated well ?No report of concerns or symptoms today ?Strength training completed today ? ?Goals Unmet:  ?Not Applicable ? ?Comments: Pt able to follow exercise prescription today without complaint.  Will continue to monitor for progression. ? ? ? ?Dr. Emily Filbert is Medical Director for [place].  ?Dr. Ottie Glazier is Medical Director for [facility] Pulmonary Rehabilitation. ?

## 2022-02-18 ENCOUNTER — Other Ambulatory Visit: Payer: Self-pay

## 2022-02-18 DIAGNOSIS — Z951 Presence of aortocoronary bypass graft: Secondary | ICD-10-CM

## 2022-02-18 NOTE — Progress Notes (Signed)
Daily Session Note ? ?Patient Details  ?Name: Charles Blake ?MRN: [id number] ?Date of Birth: 09-23-1956 ?Referring Provider:   ?Flowsheet Row Cardiac Rehab from 01/16/2022 in [facility] Cardiac and Pulmonary Rehab  ?Referring Provider Serafina Royals MD  ? ?  ? ? ?Encounter Date: 02/18/2022 ? ?Check In: ? Session Check In - 02/18/22 0919   ? ?  ? Check-In  ? Supervising physician immediately available to respond to emergencies See telemetry face sheet for immediately available ER MD   ? Location [facility]-Cardiac & Pulmonary Rehab   ? Staff Present Birdie Sons, MPA, RN;Amanda Sommer, BA, ACSM CEP, Exercise Physiologist;Jessica Luan Pulling, MA, RCEP, CCRP, CCET   ? Virtual Visit No   ? Medication changes reported     No   ? Fall or balance concerns reported    No   ? Warm-up and Cool-down Performed on first and last piece of equipment   ? Resistance Training Performed Yes   ? VAD Patient? No   ? PAD/SET Patient? No   ?  ? Pain Assessment  ? Currently in Pain? No/denies   ? ?  ?  ? ?  ? ? ? ? ? ?Social History  ? ?Tobacco Use  ?Smoking Status Former  ? Packs/day: 0.50  ? Years: 40.00  ? Pack years: 20.00  ? Types: Cigarettes  ? Quit date: 11/29/2021  ? Years since quitting: 0.2  ?Smokeless Tobacco Former  ? ? ?Goals Met:  ?Independence with exercise equipment ?Exercise tolerated well ?No report of concerns or symptoms today ?Strength training completed today ? ?Goals Unmet:  ?Not Applicable ? ?Comments: Pt able to follow exercise prescription today without complaint.  Will continue to monitor for progression. ? ? ? ?Dr. Emily Filbert is Medical Director for [place].  ?Dr. Ottie Glazier is Medical Director for [facility] Pulmonary Rehabilitation. ?

## 2022-02-20 ENCOUNTER — Other Ambulatory Visit: Payer: Self-pay

## 2022-02-20 DIAGNOSIS — Z951 Presence of aortocoronary bypass graft: Secondary | ICD-10-CM | POA: Diagnosis not present

## 2022-02-20 NOTE — Progress Notes (Signed)
Daily Session Note ? ?Patient Details  ?Name: Charles Blake ?MRN: [id number] ?Date of Birth: 08-01-1956 ?Referring Provider:   ?Flowsheet Row Cardiac Rehab from 01/16/2022 in [facility] Cardiac and Pulmonary Rehab  ?Referring Provider Serafina Royals MD  ? ?  ? ? ?Encounter Date: 02/20/2022 ? ?Check In: ? Session Check In - 02/20/22 0923   ? ?  ? Check-In  ? Supervising physician immediately available to respond to emergencies See telemetry face sheet for immediately available ER MD   ? Location [facility]-Cardiac & Pulmonary Rehab   ? Staff Present Birdie Sons, MPA, RN;Joseph [place], RCP,RRT,BSRT;Stevana Dufner [place], BS, ACSM CEP, Exercise Physiologist;Jessica [place], MA, RCEP, CCRP, CCET   ? Virtual Visit No   ? Medication changes reported     No   ? Fall or balance concerns reported    No   ? Warm-up and Cool-down Performed on first and last piece of equipment   ? Resistance Training Performed Yes   ? VAD Patient? No   ? PAD/SET Patient? No   ?  ? Pain Assessment  ? Currently in Pain? No/denies   ? ?  ?  ? ?  ? ? ? ? ? ?Social History  ? ?Tobacco Use  ?Smoking Status Former  ? Packs/day: 0.50  ? Years: 40.00  ? Pack years: 20.00  ? Types: Cigarettes  ? Quit date: 11/29/2021  ? Years since quitting: 0.2  ?Smokeless Tobacco Former  ? ? ?Goals Met:  ?Independence with exercise equipment ?Exercise tolerated well ?No report of concerns or symptoms today ?Strength training completed today ? ?Goals Unmet:  ?Not Applicable ? ?Comments: Pt able to follow exercise prescription today without complaint.  Will continue to monitor for progression. ? ? ? ?Dr. Emily Filbert is Medical Director for [place].  ?Dr. Ottie Glazier is Medical Director for [facility] Pulmonary Rehabilitation. ?

## 2022-02-25 ENCOUNTER — Other Ambulatory Visit: Payer: Self-pay

## 2022-02-25 DIAGNOSIS — Z951 Presence of aortocoronary bypass graft: Secondary | ICD-10-CM | POA: Diagnosis not present

## 2022-02-25 NOTE — Progress Notes (Signed)
Daily Session Note ? ?Patient Details  ?Name: Charles Blake ?MRN: [id number] ?Date of Birth: February 11, 1956 ?Referring Provider:   ?Flowsheet Row Cardiac Rehab from 01/16/2022 in [facility] Cardiac and Pulmonary Rehab  ?Referring Provider Serafina Royals MD  ? ?  ? ? ?Encounter Date: 02/25/2022 ? ?Check In: ? Session Check In - 02/25/22 0931   ? ?  ? Check-In  ? Supervising physician immediately available to respond to emergencies See telemetry face sheet for immediately available ER MD   ? Location [facility]-Cardiac & Pulmonary Rehab   ? Staff Present Birdie Sons, MPA, RN;Jessica [place], MA, RCEP, CCRP, CCET;Amanda Sommer, BA, ACSM CEP, Exercise Physiologist   ? Virtual Visit No   ? Medication changes reported     No   ? Fall or balance concerns reported    No   ? Warm-up and Cool-down Performed on first and last piece of equipment   ? Resistance Training Performed Yes   ? VAD Patient? No   ? PAD/SET Patient? No   ?  ? Pain Assessment  ? Currently in Pain? No/denies   ? ?  ?  ? ?  ? ? ? ? ? ?Social History  ? ?Tobacco Use  ?Smoking Status Former  ? Packs/day: 0.50  ? Years: 40.00  ? Pack years: 20.00  ? Types: Cigarettes  ? Quit date: 11/29/2021  ? Years since quitting: 0.2  ?Smokeless Tobacco Former  ? ? ?Goals Met:  ?Independence with exercise equipment ?Exercise tolerated well ?No report of concerns or symptoms today ?Strength training completed today ? ?Goals Unmet:  ?Not Applicable ? ?Comments: Pt able to follow exercise prescription today without complaint.  Will continue to monitor for progression. ? ? ? ?Dr. Emily Filbert is Medical Director for [place].  ?Dr. Ottie Glazier is Medical Director for [facility] Pulmonary Rehabilitation. ?

## 2022-02-26 ENCOUNTER — Encounter: Payer: Self-pay | Admitting: *Deleted

## 2022-02-26 DIAGNOSIS — Z951 Presence of aortocoronary bypass graft: Secondary | ICD-10-CM

## 2022-02-26 DIAGNOSIS — R600 Localized edema: Secondary | ICD-10-CM | POA: Insufficient documentation

## 2022-02-26 NOTE — Progress Notes (Signed)
Cardiac Individual Treatment Plan ? ?Patient Details  ?Name: Charles Blake ?MRN: [id number] ?Date of Birth: 10-Sep-1956 ?Referring Provider:   ?Flowsheet Row Cardiac Rehab from 01/16/2022 in [facility] Cardiac and Pulmonary Rehab  ?Referring Provider Serafina Royals MD  ? ?  ? ? ?Initial Encounter Date:  ?Flowsheet Row Cardiac Rehab from 01/16/2022 in [facility] Cardiac and Pulmonary Rehab  ?Date 01/16/22  ? ?  ? ? ?Visit Diagnosis: S/P CABG x 3 ? ?Patient's Home Medications on Admission: ? ?Current Outpatient Medications:  ?  acetaminophen (TYLENOL) 650 MG CR tablet, Take 650 mg by mouth every 8 (eight) hours as needed for pain., Disp: , Rfl:  ?  albuterol (PROVENTIL HFA;VENTOLIN HFA) 108 (90 Base) MCG/ACT inhaler, Inhale 2 puffs into the lungs every 4 (four) hours as needed for wheezing., Disp: 1 Inhaler, Rfl: 0 ?  amLODipine (NORVASC) 5 MG tablet, Take 5 mg by mouth daily., Disp: , Rfl:  ?  aspirin EC 81 MG EC tablet, Take 1 tablet (81 mg total) by mouth daily. Swallow whole., Disp: , Rfl:  ?  carvedilol (COREG) 6.25 MG tablet, Take 6.25 mg by mouth 2 (two) times daily with a meal., Disp: , Rfl:  ?  clopidogrel (PLAVIX) 75 MG tablet, Take 1 tablet by mouth once daily (Patient taking differently: Take 75 mg by mouth every evening.), Disp: 90 tablet, Rfl: 0 ?  escitalopram (LEXAPRO) 10 MG tablet, Take 10 mg by mouth in the morning., Disp: , Rfl:  ?  furosemide (LASIX) 20 MG tablet, Take 20 mg by mouth daily., Disp: , Rfl:  ?  heparin 25000 UT/250ML infusion, Inject 1,500 Units/hr into the vein continuous. (Patient not taking: Reported on 01/03/2022), Disp: , Rfl:  ?  isosorbide mononitrate (IMDUR) 60 MG 24 hr tablet, Take 60 mg by mouth daily. (Patient not taking: Reported on 01/03/2022), Disp: , Rfl:  ?  ketoconazole (NIZORAL) 2 % shampoo, as needed., Disp: , Rfl:  ?  KLOR-CON M20 20 MEQ tablet, Take 40 mEq by mouth 2 (two) times daily., Disp: , Rfl:  ?  lovastatin (MEVACOR) 40 MG tablet, Take 40 mg by mouth at bedtime., Disp: ,  Rfl:  ?  lovastatin (MEVACOR) 40 MG tablet, Take 1 tablet by mouth daily with supper., Disp: , Rfl:  ?  Melatonin 3 MG CAPS, Take 3 mg by mouth at bedtime., Disp: , Rfl:  ?  metFORMIN (GLUCOPHAGE) 500 MG tablet, Take 500 mg by mouth 2 (two) times daily., Disp: , Rfl:  ?  Multiple Vitamin (MULTI-VITAMINS) TABS, Take 1 tablet by mouth daily., Disp: , Rfl:  ?  niacin (SLO-NIACIN) 500 MG tablet, Take 500 mg by mouth at bedtime., Disp: , Rfl:  ?  nitroGLYCERIN (NITROSTAT) 0.4 MG SL tablet, Place 1 tablet (0.4 mg total) under the tongue every 5 (five) minutes as needed for chest pain. (Patient not taking: Reported on 01/03/2022), Disp: , Rfl:  ?  pantoprazole (PROTONIX) 40 MG tablet, Take 40 mg by mouth daily., Disp: , Rfl:  ?  telmisartan-hydrochlorothiazide (MICARDIS HCT) 80-12.5 MG tablet, Take 1 tablet by mouth daily. (Patient not taking: Reported on 01/03/2022), Disp: , Rfl: 3 ?  umeclidinium-vilanterol (ANORO ELLIPTA) 62.5-25 MCG/ACT AEPB, Inhale 1 puff into the lungs daily., Disp: , Rfl:  ? ?Past Medical History: ?Past Medical History:  ?Diagnosis Date  ? Anemia   ? Anxiety   ? Arthritis   ? fingers and hands  ? Cancer [facility])   ? skin cancer  ? COPD (chronic obstructive pulmonary  disease) ([place])   ? wheezing  ? Coronary artery disease   ? Dyspnea   ? GERD (gastroesophageal reflux disease)   ? Headache   ? 2-3 times/ week  ? History of hiatal hernia   ? HOH (hard of hearing)   ? Hypercholesteremia   ? Hypertension   ? controlled  ? Pneumonia   ? Sleep apnea   ? CPAP  ? Wears dentures   ? upper  ? ? ?Tobacco Use: ?Social History  ? ?Tobacco Use  ?Smoking Status Former  ? Packs/day: 0.50  ? Years: 40.00  ? Pack years: 20.00  ? Types: Cigarettes  ? Quit date: 11/29/2021  ? Years since quitting: 0.2  ?Smokeless Tobacco Former  ? ? ?Labs: ?Review Flowsheet   ? ?    ? View : No data to display.  ?  ?  ?  ?  ?  ? ? ? ?Exercise Target Goals: ?Exercise Program Goal: ?Individual exercise prescription set using results from  initial 6 min walk test and THRR while considering  patient?s activity barriers and safety.  ? ?Exercise Prescription Goal: ?Initial exercise prescription builds to 30-45 minutes a day of aerobic activity, 2-3 days per week.  Home exercise guidelines will be given to patient during program as part of exercise prescription that the participant will acknowledge. ? ? ?Education: Aerobic Exercise: ?- Group verbal and visual presentation on the components of exercise prescription. Introduces F.I.T.T principle from ACSM for exercise prescriptions.  Reviews F.I.T.T. principles of aerobic exercise including progression. Written material given at graduation. ?Flowsheet Row Cardiac Rehab from 02/20/2022 in [facility] Cardiac and Pulmonary Rehab  ?Education need identified 01/16/22  ?Date 02/06/22  ?Educator AS  ?Instruction Review Code 1- Verbalizes Understanding  ? ?  ? ? ?Education: Resistance Exercise: ?- Group verbal and visual presentation on the components of exercise prescription. Introduces F.I.T.T principle from ACSM for exercise prescriptions  Reviews F.I.T.T. principles of resistance exercise including progression. Written material given at graduation. ?Flowsheet Row Cardiac Rehab from 02/20/2022 in [facility] Cardiac and Pulmonary Rehab  ?Date 02/13/22  ?Educator AS  ?Instruction Review Code 1- Verbalizes Understanding  ? ?  ? ?  ?Education: Exercise & Equipment Safety: ?- Individual verbal instruction and demonstration of equipment use and safety with use of the equipment. ?Flowsheet Row Cardiac Rehab from 02/20/2022 in [facility] Cardiac and Pulmonary Rehab  ?Date 01/16/22  ?Educator [facility]  ?Instruction Review Code 1- Verbalizes Understanding  ? ?  ? ? ?Education: Exercise Physiology & General Exercise Guidelines: ?- Group verbal and written instruction with models to review the exercise physiology of the cardiovascular system and associated critical values. Provides general exercise guidelines with specific guidelines to those with  heart or lung disease.  ?Flowsheet Row Cardiac Rehab from 02/20/2022 in [facility] Cardiac and Pulmonary Rehab  ?Date 01/30/22  ?Educator AS  ?Instruction Review Code 1- Verbalizes Understanding  ? ?  ? ? ?Education: Flexibility, Balance, Mind/Body Relaxation: ?- Group verbal and visual presentation with interactive activity on the components of exercise prescription. Introduces F.I.T.T principle from ACSM for exercise prescriptions. Reviews F.I.T.T. principles of flexibility and balance exercise training including progression. Also discusses the mind body connection.  Reviews various relaxation techniques to help reduce and manage stress (i.e. Deep breathing, progressive muscle relaxation, and visualization). Balance handout provided to take home. Written material given at graduation. ? ? ?Activity Barriers & Risk Stratification: ? Activity Barriers & Cardiac Risk Stratification - 01/16/22 1045   ? ?  ? Activity Barriers &  Cardiac Risk Stratification  ? Activity Barriers Other (comment);Muscular Weakness;Deconditioning;Balance Concerns;Shortness of Breath   ? Comments PAD w/ stent- can slow him down some   ? Cardiac Risk Stratification High   ? ?  ?  ? ?  ? ? ?6 Minute Walk: ? 6 Minute Walk   ? ? Luis Llorens Torres Name 01/16/22 1044  ?  ?  ?  ? 6 Minute Walk  ? Phase Initial    ? Distance 960 feet    ? Walk Time 6 minutes    ? # of Rest Breaks 0    ? MPH 1.82    ? METS 2.4    ? RPE 17    ? Perceived Dyspnea  2    ? VO2 Peak 8.4    ? Symptoms Yes (comment)    ? Comments PAD claudication (6/10, CPS 4/5), SOB    ? Resting HR 65 bpm    ? Resting BP 114/64    ? Resting Oxygen Saturation  97 %    ? Exercise Oxygen Saturation  during 6 min walk 96 %    ? Max Ex. HR 108 bpm    ? Max Ex. BP 142/84    ? 2 Minute Post BP 134/60    ? ?  ?  ? ?  ? ? ?Oxygen Initial Assessment: ? ? ?Oxygen Re-Evaluation: ? ? ?Oxygen Discharge (Final Oxygen Re-Evaluation): ? ? ?Initial Exercise Prescription: ? Initial Exercise Prescription - 01/16/22 1000   ? ?  ?  Date of Initial Exercise RX and Referring Provider  ? Date 01/16/22   ? Referring Provider Serafina Royals MD   ?  ? Oxygen  ? Maintain Oxygen Saturation 88% or higher   ?  ? Treadmill  ? MPH 1.7   ? Grade 0   ?

## 2022-02-27 ENCOUNTER — Other Ambulatory Visit: Payer: Self-pay

## 2022-02-27 DIAGNOSIS — Z951 Presence of aortocoronary bypass graft: Secondary | ICD-10-CM | POA: Diagnosis not present

## 2022-02-27 NOTE — Progress Notes (Signed)
Daily Session Note ? ?Patient Details  ?Name: Charles Blake ?MRN: [id number] ?Date of Birth: October 01, 1956 ?Referring Provider:   ?Flowsheet Row Cardiac Rehab from 01/16/2022 in [facility] Cardiac and Pulmonary Rehab  ?Referring Provider Serafina Royals MD  ? ?  ? ? ?Encounter Date: 02/27/2022 ? ?Check In: ? Session Check In - 02/27/22 0923   ? ?  ? Check-In  ? Supervising physician immediately available to respond to emergencies See telemetry face sheet for immediately available ER MD   ? Location [facility]-Cardiac & Pulmonary Rehab   ? Staff Present Birdie Sons, MPA, Nino Glow, MS, ASCM CEP, Exercise Physiologist;Kameria Canizares Amedeo Plenty, BS, ACSM CEP, Exercise Physiologist   ? Virtual Visit No   ? Medication changes reported     Yes   ? Comments amlodipine reduced to 2.$RemoveBefo'5mg'pvHpIluufGT$ ; valsartan increased to $RemoveBefo'320mg'NFvHlQHmCmR$ ; back on lasix $Remove'20mg'EhdZqHA$    ? Fall or balance concerns reported    No   ? Warm-up and Cool-down Performed on first and last piece of equipment   ? Resistance Training Performed Yes   ? VAD Patient? No   ? PAD/SET Patient? No   ?  ? Pain Assessment  ? Currently in Pain? No/denies   ? ?  ?  ? ?  ? ? ? ? ? ?Social History  ? ?Tobacco Use  ?Smoking Status Former  ? Packs/day: 0.50  ? Years: 40.00  ? Pack years: 20.00  ? Types: Cigarettes  ? Quit date: 11/29/2021  ? Years since quitting: 0.2  ?Smokeless Tobacco Former  ? ? ?Goals Met:  ?Independence with exercise equipment ?Exercise tolerated well ?No report of concerns or symptoms today ?Strength training completed today ? ?Goals Unmet:  ?Not Applicable ? ?Comments: Pt able to follow exercise prescription today without complaint.  Will continue to monitor for progression. ? ? ? ?Dr. Emily Filbert is Medical Director for [place].  ?Dr. Ottie Glazier is Medical Director for [facility] Pulmonary Rehabilitation. ?

## 2022-03-04 ENCOUNTER — Other Ambulatory Visit: Payer: Self-pay

## 2022-03-04 DIAGNOSIS — Z951 Presence of aortocoronary bypass graft: Secondary | ICD-10-CM | POA: Diagnosis not present

## 2022-03-04 NOTE — Progress Notes (Signed)
Daily Session Note ? ?Patient Details  ?Name: Charles Blake ?MRN: [id number] ?Date of Birth: June 08, 1956 ?Referring Provider:   ?Flowsheet Row Cardiac Rehab from 01/16/2022 in [facility] Cardiac and Pulmonary Rehab  ?Referring Provider Serafina Royals MD  ? ?  ? ? ?Encounter Date: 03/04/2022 ? ?Check In: ? Session Check In - 03/04/22 0918   ? ?  ? Check-In  ? Supervising physician immediately available to respond to emergencies See telemetry face sheet for immediately available ER MD   ? Location [facility]-Cardiac & Pulmonary Rehab   ? Staff Present Birdie Sons, MPA, RN;Amanda Sommer, BA, ACSM CEP, Exercise Physiologist;Jessica Luan Pulling, MA, RCEP, CCRP, CCET   ? Virtual Visit No   ? Medication changes reported     No   ? Comments valsartan dose is $Remov'360mg'plxVbm$    ? Fall or balance concerns reported    No   ? Warm-up and Cool-down Performed on first and last piece of equipment   ? Resistance Training Performed Yes   ? VAD Patient? No   ? PAD/SET Patient? No   ?  ? Pain Assessment  ? Currently in Pain? No/denies   ? ?  ?  ? ?  ? ? ? ? ? ?Social History  ? ?Tobacco Use  ?Smoking Status Former  ? Packs/day: 0.50  ? Years: 40.00  ? Pack years: 20.00  ? Types: Cigarettes  ? Quit date: 11/29/2021  ? Years since quitting: 0.2  ?Smokeless Tobacco Former  ? ? ?Goals Met:  ?Independence with exercise equipment ?Exercise tolerated well ?No report of concerns or symptoms today ?Strength training completed today ? ?Goals Unmet:  ?Not Applicable ? ?Comments: Pt able to follow exercise prescription today without complaint.  Will continue to monitor for progression. ? ? ? ?Dr. Emily Filbert is Medical Director for [place].  ?Dr. Ottie Glazier is Medical Director for [facility] Pulmonary Rehabilitation. ?

## 2022-03-06 DIAGNOSIS — Z951 Presence of aortocoronary bypass graft: Secondary | ICD-10-CM | POA: Diagnosis not present

## 2022-03-06 NOTE — Progress Notes (Signed)
Daily Session Note ? ?Patient Details  ?Name: Charles Blake ?MRN: [id number] ?Date of Birth: [birth year] ?Referring Provider:   ?Flowsheet Row Cardiac Rehab from 01/16/2022 in [facility] Cardiac and Pulmonary Rehab  ?Referring Provider Serafina Royals MD  ? ?  ? ? ?Encounter Date: 03/06/2022 ? ?Check In: ? Session Check In - 03/06/22 0938   ? ?  ? Check-In  ? Supervising physician immediately available to respond to emergencies See telemetry face sheet for immediately available ER MD   ? Location [facility]-Cardiac & Pulmonary Rehab   ? Staff Present Birdie Sons, MPA, Nino Glow, MS, ASCM CEP, Exercise Physiologist;Melissa [place], RDN, Rowe Pavy, BA, ACSM CEP, Exercise Physiologist   ? Virtual Visit No   ? Medication changes reported     No   ? Fall or balance concerns reported    No   ? Warm-up and Cool-down Performed on first and last piece of equipment   ? Resistance Training Performed Yes   ? VAD Patient? No   ? PAD/SET Patient? No   ?  ? Pain Assessment  ? Currently in Pain? No/denies   ? ?  ?  ? ?  ? ? ? ? ? ?Social History  ? ?Tobacco Use  ?Smoking Status Former  ? Packs/day: 0.50  ? Years: 40.00  ? Pack years: 20.00  ? Types: Cigarettes  ? Quit date: 11/29/2021  ? Years since quitting: 0.2  ?Smokeless Tobacco Former  ? ? ?Goals Met:  ?Independence with exercise equipment ?Exercise tolerated well ?Personal goals reviewed ?No report of concerns or symptoms today ?Strength training completed today ? ?Goals Unmet:  ?Not Applicable ? ?Comments: Pt able to follow exercise prescription today without complaint.  Will continue to monitor for progression. ? ? ? ?Dr. Emily Filbert is Medical Director for [place].  ?Dr. Ottie Glazier is Medical Director for [facility] Pulmonary Rehabilitation. ?

## 2022-03-11 ENCOUNTER — Encounter: Payer: Medicare Other | Attending: Internal Medicine

## 2022-03-11 DIAGNOSIS — I214 Non-ST elevation (NSTEMI) myocardial infarction: Secondary | ICD-10-CM | POA: Diagnosis not present

## 2022-03-11 DIAGNOSIS — Z5189 Encounter for other specified aftercare: Secondary | ICD-10-CM | POA: Diagnosis present

## 2022-03-11 DIAGNOSIS — Z951 Presence of aortocoronary bypass graft: Secondary | ICD-10-CM | POA: Diagnosis not present

## 2022-03-11 NOTE — Progress Notes (Signed)
Daily Session Note ? ?Patient Details  ?Name: Charles Blake ?MRN: [id number] ?Date of Birth: 1956-06-24 ?Referring Provider:   ?Flowsheet Row Cardiac Rehab from 01/16/2022 in [facility] Cardiac and Pulmonary Rehab  ?Referring Provider Serafina Royals MD  ? ?  ? ? ?Encounter Date: 03/11/2022 ? ?Check In: ? Session Check In - 03/11/22 0916   ? ?  ? Check-In  ? Supervising physician immediately available to respond to emergencies See telemetry face sheet for immediately available ER MD   ? Location [facility]-Cardiac & Pulmonary Rehab   ? Staff Present Birdie Sons, MPA, RN;Amanda Sommer, BA, ACSM CEP, Exercise Physiologist;Jessica Luan Pulling, MA, RCEP, CCRP, CCET   ? Virtual Visit No   ? Medication changes reported     No   ? Fall or balance concerns reported    No   ? Warm-up and Cool-down Performed on first and last piece of equipment   ? Resistance Training Performed Yes   ? VAD Patient? No   ? PAD/SET Patient? No   ?  ? Pain Assessment  ? Currently in Pain? No/denies   ? ?  ?  ? ?  ? ? ? ? ? ?Social History  ? ?Tobacco Use  ?Smoking Status Former  ? Packs/day: 0.50  ? Years: 40.00  ? Pack years: 20.00  ? Types: Cigarettes  ? Quit date: 11/29/2021  ? Years since quitting: 0.2  ?Smokeless Tobacco Former  ? ? ?Goals Met:  ?Independence with exercise equipment ?Exercise tolerated well ?No report of concerns or symptoms today ?Strength training completed today ? ?Goals Unmet:  ?Not Applicable ? ?Comments: Pt able to follow exercise prescription today without complaint.  Will continue to monitor for progression. ? ? ? ?Dr. Emily Filbert is Medical Director for [place].  ?Dr. Ottie Glazier is Medical Director for [facility] Pulmonary Rehabilitation. ?

## 2022-03-13 DIAGNOSIS — Z951 Presence of aortocoronary bypass graft: Secondary | ICD-10-CM

## 2022-03-13 DIAGNOSIS — Z5189 Encounter for other specified aftercare: Secondary | ICD-10-CM | POA: Diagnosis not present

## 2022-03-13 NOTE — Progress Notes (Signed)
Daily Session Note ? ?Patient Details  ?Name: Charles Blake ?MRN: [id number] ?Date of Birth: 19-Jan-1956 ?Referring Provider:   ?Flowsheet Row Cardiac Rehab from 01/16/2022 in [facility] Cardiac and Pulmonary Rehab  ?Referring Provider Serafina Royals MD  ? ?  ? ? ?Encounter Date: 03/13/2022 ? ?Check In: ? Session Check In - 03/13/22 0917   ? ?  ? Check-In  ? Supervising physician immediately available to respond to emergencies See telemetry face sheet for immediately available ER MD   ? Location [facility]-Cardiac & Pulmonary Rehab   ? Staff Present Birdie Sons, MPA, RN;Jessica [place], MA, RCEP, CCRP, CCET;Amanda Sommer, BA, ACSM CEP, Exercise Physiologist;Sharnee Douglass [place], BS, ACSM CEP, Exercise Physiologist   ? Virtual Visit No   ? Medication changes reported     No   ? Fall or balance concerns reported    No   ? Warm-up and Cool-down Performed on first and last piece of equipment   ? Resistance Training Performed Yes   ? VAD Patient? No   ? PAD/SET Patient? No   ?  ? Pain Assessment  ? Currently in Pain? No/denies   ? ?  ?  ? ?  ? ? ? ? ? ?Social History  ? ?Tobacco Use  ?Smoking Status Former  ? Packs/day: 0.50  ? Years: 40.00  ? Pack years: 20.00  ? Types: Cigarettes  ? Quit date: 11/29/2021  ? Years since quitting: 0.2  ?Smokeless Tobacco Former  ? ? ?Goals Met:  ?Independence with exercise equipment ?Exercise tolerated well ?No report of concerns or symptoms today ?Strength training completed today ? ?Goals Unmet:  ?Not Applicable ? ?Comments: Pt able to follow exercise prescription today without complaint.  Will continue to monitor for progression. ? ? ? ?Dr. Emily Filbert is Medical Director for [place].  ?Dr. Ottie Glazier is Medical Director for [facility] Pulmonary Rehabilitation. ?

## 2022-03-18 DIAGNOSIS — Z5189 Encounter for other specified aftercare: Secondary | ICD-10-CM | POA: Diagnosis not present

## 2022-03-18 DIAGNOSIS — Z951 Presence of aortocoronary bypass graft: Secondary | ICD-10-CM

## 2022-03-18 NOTE — Progress Notes (Signed)
Daily Session Note ? ?Patient Details  ?Name: NYAL SCHACHTER ?MRN: [id number] ?Date of Birth: 04-Jan-1956 ?Referring Provider:   ?Flowsheet Row Cardiac Rehab from 01/16/2022 in [facility] Cardiac and Pulmonary Rehab  ?Referring Provider Serafina Royals MD  ? ?  ? ? ?Encounter Date: 03/18/2022 ? ?Check In: ? Session Check In - 03/18/22 0927   ? ?  ? Check-In  ? Supervising physician immediately available to respond to emergencies See telemetry face sheet for immediately available ER MD   ? Location [facility]-Cardiac & Pulmonary Rehab   ? Staff Present Birdie Sons, MPA, RN;Melissa [place], RDN, Rowe Pavy, BA, ACSM CEP, Exercise Physiologist   ? Virtual Visit No   ? Medication changes reported     No   ? Fall or balance concerns reported    No   ? Warm-up and Cool-down Performed on first and last piece of equipment   ? Resistance Training Performed Yes   ? VAD Patient? No   ? PAD/SET Patient? No   ?  ? Pain Assessment  ? Currently in Pain? No/denies   ? ?  ?  ? ?  ? ? ? ? ? ?Social History  ? ?Tobacco Use  ?Smoking Status Former  ? Packs/day: 0.50  ? Years: 40.00  ? Pack years: 20.00  ? Types: Cigarettes  ? Quit date: 11/29/2021  ? Years since quitting: 0.2  ?Smokeless Tobacco Former  ? ? ?Goals Met:  ?Independence with exercise equipment ?Exercise tolerated well ?No report of concerns or symptoms today ?Strength training completed today ? ?Goals Unmet:  ?Not Applicable ? ?Comments: Pt able to follow exercise prescription today without complaint.  Will continue to monitor for progression. ? ? ? ?Dr. Emily Filbert is Medical Director for [place].  ?Dr. Ottie Glazier is Medical Director for [facility] Pulmonary Rehabilitation. ?

## 2022-03-20 ENCOUNTER — Encounter: Payer: Medicare Other | Admitting: *Deleted

## 2022-03-20 DIAGNOSIS — Z5189 Encounter for other specified aftercare: Secondary | ICD-10-CM | POA: Diagnosis not present

## 2022-03-20 DIAGNOSIS — Z951 Presence of aortocoronary bypass graft: Secondary | ICD-10-CM

## 2022-03-20 NOTE — Progress Notes (Signed)
Daily Session Note ? ?Patient Details  ?Name: Charles Blake ?MRN: [id number] ?Date of Birth: [birth year] ?Referring Provider:   ?Flowsheet Row Cardiac Rehab from 01/16/2022 in [facility] Cardiac and Pulmonary Rehab  ?Referring Provider Serafina Royals MD  ? ?  ? ? ?Encounter Date: 03/20/2022 ? ?Check In: ? ? ? ? ? ?Social History  ? ?Tobacco Use  ?Smoking Status Former  ? Packs/day: 0.50  ? Years: 40.00  ? Pack years: 20.00  ? Types: Cigarettes  ? Quit date: 11/29/2021  ? Years since quitting: 0.3  ?Smokeless Tobacco Former  ? ? ?Goals Met:  ?Independence with exercise equipment ?Exercise tolerated well ?Strength training completed today ? ?Goals Unmet:  ?Not Applicable ? ?Comments: Pt able to follow exercise prescription today without complaint.  Will continue to monitor for progression.  ? ? ?Dr. Emily Filbert is Medical Director for [place].  ?Dr. Ottie Glazier is Medical Director for [facility] Pulmonary Rehabilitation. ?

## 2022-03-25 DIAGNOSIS — Z5189 Encounter for other specified aftercare: Secondary | ICD-10-CM | POA: Diagnosis not present

## 2022-03-25 DIAGNOSIS — Z951 Presence of aortocoronary bypass graft: Secondary | ICD-10-CM

## 2022-03-25 NOTE — Progress Notes (Signed)
Daily Session Note ? ?Patient Details  ?Name: Charles Blake ?MRN: [id number] ?Date of Birth: 1956/08/08 ?Referring Provider:   ?Flowsheet Row Cardiac Rehab from 01/16/2022 in [facility] Cardiac and Pulmonary Rehab  ?Referring Provider Serafina Royals MD  ? ?  ? ? ?Encounter Date: 03/25/2022 ? ?Check In: ? Session Check In - 03/25/22 0935   ? ?  ? Check-In  ? Supervising physician immediately available to respond to emergencies See telemetry face sheet for immediately available ER MD   ? Location [facility]-Cardiac & Pulmonary Rehab   ? Staff Present Birdie Sons, MPA, RN;Jessica [place], MA, RCEP, CCRP, CCET;Amanda Sommer, BA, ACSM CEP, Exercise Physiologist   ? Virtual Visit No   ? Medication changes reported     No   ? Fall or balance concerns reported    No   ? Tobacco Cessation No Change   ? Warm-up and Cool-down Performed on first and last piece of equipment   ? Resistance Training Performed Yes   ? VAD Patient? No   ? PAD/SET Patient? No   ?  ? Pain Assessment  ? Currently in Pain? No/denies   ? ?  ?  ? ?  ? ? ? ? ? ?Social History  ? ?Tobacco Use  ?Smoking Status Former  ? Packs/day: 0.50  ? Years: 40.00  ? Pack years: 20.00  ? Types: Cigarettes  ? Quit date: 11/29/2021  ? Years since quitting: 0.3  ?Smokeless Tobacco Former  ? ? ?Goals Met:  ?Independence with exercise equipment ?Exercise tolerated well ?No report of concerns or symptoms today ?Strength training completed today ? ?Goals Unmet:  ?Not Applicable ? ?Comments: Pt able to follow exercise prescription today without complaint.  Will continue to monitor for progression. ? ? ? ?Dr. Emily Filbert is Medical Director for [place].  ?Dr. Ottie Glazier is Medical Director for [facility] Pulmonary Rehabilitation. ?

## 2022-03-26 ENCOUNTER — Encounter: Payer: Self-pay | Admitting: *Deleted

## 2022-03-26 DIAGNOSIS — Z951 Presence of aortocoronary bypass graft: Secondary | ICD-10-CM

## 2022-03-26 NOTE — Progress Notes (Signed)
Cardiac Individual Treatment Plan ? ?Patient Details  ?Name: Charles Blake ?MRN: [id number] ?Date of Birth: 07-15-1956 ?Referring Provider:   ?Flowsheet Row Cardiac Rehab from 01/16/2022 in [facility] Cardiac and Pulmonary Rehab  ?Referring Provider Serafina Royals MD  ? ?  ? ? ?Initial Encounter Date:  ?Flowsheet Row Cardiac Rehab from 01/16/2022 in [facility] Cardiac and Pulmonary Rehab  ?Date 01/16/22  ? ?  ? ? ?Visit Diagnosis: S/P CABG x 3 ? ?Patient's Home Medications on Admission: ? ?Current Outpatient Medications:  ?  acetaminophen (TYLENOL) 650 MG CR tablet, Take 650 mg by mouth every 8 (eight) hours as needed for pain., Disp: , Rfl:  ?  albuterol (PROVENTIL HFA;VENTOLIN HFA) 108 (90 Base) MCG/ACT inhaler, Inhale 2 puffs into the lungs every 4 (four) hours as needed for wheezing., Disp: 1 Inhaler, Rfl: 0 ?  amLODipine (NORVASC) 5 MG tablet, Take 5 mg by mouth daily., Disp: , Rfl:  ?  aspirin EC 81 MG EC tablet, Take 1 tablet (81 mg total) by mouth daily. Swallow whole., Disp: , Rfl:  ?  carvedilol (COREG) 6.25 MG tablet, Take 6.25 mg by mouth 2 (two) times daily with a meal., Disp: , Rfl:  ?  clopidogrel (PLAVIX) 75 MG tablet, Take 1 tablet by mouth once daily (Patient taking differently: Take 75 mg by mouth every evening.), Disp: 90 tablet, Rfl: 0 ?  escitalopram (LEXAPRO) 10 MG tablet, Take 10 mg by mouth in the morning., Disp: , Rfl:  ?  furosemide (LASIX) 20 MG tablet, Take 20 mg by mouth daily., Disp: , Rfl:  ?  heparin 25000 UT/250ML infusion, Inject 1,500 Units/hr into the vein continuous. (Patient not taking: Reported on 01/03/2022), Disp: , Rfl:  ?  isosorbide mononitrate (IMDUR) 60 MG 24 hr tablet, Take 60 mg by mouth daily. (Patient not taking: Reported on 01/03/2022), Disp: , Rfl:  ?  ketoconazole (NIZORAL) 2 % shampoo, as needed., Disp: , Rfl:  ?  KLOR-CON M20 20 MEQ tablet, Take 40 mEq by mouth 2 (two) times daily., Disp: , Rfl:  ?  lovastatin (MEVACOR) 40 MG tablet, Take 40 mg by mouth at bedtime., Disp: ,  Rfl:  ?  lovastatin (MEVACOR) 40 MG tablet, Take 1 tablet by mouth daily with supper., Disp: , Rfl:  ?  Melatonin 3 MG CAPS, Take 3 mg by mouth at bedtime., Disp: , Rfl:  ?  metFORMIN (GLUCOPHAGE) 500 MG tablet, Take 500 mg by mouth 2 (two) times daily., Disp: , Rfl:  ?  Multiple Vitamin (MULTI-VITAMINS) TABS, Take 1 tablet by mouth daily., Disp: , Rfl:  ?  niacin (SLO-NIACIN) 500 MG tablet, Take 500 mg by mouth at bedtime., Disp: , Rfl:  ?  nitroGLYCERIN (NITROSTAT) 0.4 MG SL tablet, Place 1 tablet (0.4 mg total) under the tongue every 5 (five) minutes as needed for chest pain. (Patient not taking: Reported on 01/03/2022), Disp: , Rfl:  ?  pantoprazole (PROTONIX) 40 MG tablet, Take 40 mg by mouth daily., Disp: , Rfl:  ?  telmisartan-hydrochlorothiazide (MICARDIS HCT) 80-12.5 MG tablet, Take 1 tablet by mouth daily. (Patient not taking: Reported on 01/03/2022), Disp: , Rfl: 3 ?  umeclidinium-vilanterol (ANORO ELLIPTA) 62.5-25 MCG/ACT AEPB, Inhale 1 puff into the lungs daily., Disp: , Rfl:  ? ?Past Medical History: ?Past Medical History:  ?Diagnosis Date  ? Anemia   ? Anxiety   ? Arthritis   ? fingers and hands  ? Cancer [facility])   ? skin cancer  ? COPD (chronic obstructive pulmonary  disease) ([place])   ? wheezing  ? Coronary artery disease   ? Dyspnea   ? GERD (gastroesophageal reflux disease)   ? Headache   ? 2-3 times/ week  ? History of hiatal hernia   ? HOH (hard of hearing)   ? Hypercholesteremia   ? Hypertension   ? controlled  ? Pneumonia   ? Sleep apnea   ? CPAP  ? Wears dentures   ? upper  ? ? ?Tobacco Use: ?Social History  ? ?Tobacco Use  ?Smoking Status Former  ? Packs/day: 0.50  ? Years: 40.00  ? Pack years: 20.00  ? Types: Cigarettes  ? Quit date: 11/29/2021  ? Years since quitting: 0.3  ?Smokeless Tobacco Former  ? ? ?Labs: ?Review Flowsheet   ? ?    ? View : No data to display.  ?  ?  ?  ?  ?  ? ? ? ?Exercise Target Goals: ?Exercise Program Goal: ?Individual exercise prescription set using results from  initial 6 min walk test and THRR while considering  patient?s activity barriers and safety.  ? ?Exercise Prescription Goal: ?Initial exercise prescription builds to 30-45 minutes a day of aerobic activity, 2-3 days per week.  Home exercise guidelines will be given to patient during program as part of exercise prescription that the participant will acknowledge. ? ? ?Education: Aerobic Exercise: ?- Group verbal and visual presentation on the components of exercise prescription. Introduces F.I.T.T principle from ACSM for exercise prescriptions.  Reviews F.I.T.T. principles of aerobic exercise including progression. Written material given at graduation. ?Flowsheet Row Cardiac Rehab from 03/20/2022 in [facility] Cardiac and Pulmonary Rehab  ?Education need identified 01/16/22  ?Date 02/06/22  ?Educator AS  ?Instruction Review Code 1- Verbalizes Understanding  ? ?  ? ? ?Education: Resistance Exercise: ?- Group verbal and visual presentation on the components of exercise prescription. Introduces F.I.T.T principle from ACSM for exercise prescriptions  Reviews F.I.T.T. principles of resistance exercise including progression. Written material given at graduation. ?Flowsheet Row Cardiac Rehab from 03/20/2022 in [facility] Cardiac and Pulmonary Rehab  ?Date 02/13/22  ?Educator AS  ?Instruction Review Code 1- Verbalizes Understanding  ? ?  ? ?  ?Education: Exercise & Equipment Safety: ?- Individual verbal instruction and demonstration of equipment use and safety with use of the equipment. ?Flowsheet Row Cardiac Rehab from 03/20/2022 in [facility] Cardiac and Pulmonary Rehab  ?Date 01/16/22  ?Educator [facility]  ?Instruction Review Code 1- Verbalizes Understanding  ? ?  ? ? ?Education: Exercise Physiology & General Exercise Guidelines: ?- Group verbal and written instruction with models to review the exercise physiology of the cardiovascular system and associated critical values. Provides general exercise guidelines with specific guidelines to those with  heart or lung disease.  ?Flowsheet Row Cardiac Rehab from 03/20/2022 in [facility] Cardiac and Pulmonary Rehab  ?Date 01/30/22  ?Educator AS  ?Instruction Review Code 1- Verbalizes Understanding  ? ?  ? ? ?Education: Flexibility, Balance, Mind/Body Relaxation: ?- Group verbal and visual presentation with interactive activity on the components of exercise prescription. Introduces F.I.T.T principle from ACSM for exercise prescriptions. Reviews F.I.T.T. principles of flexibility and balance exercise training including progression. Also discusses the mind body connection.  Reviews various relaxation techniques to help reduce and manage stress (i.e. Deep breathing, progressive muscle relaxation, and visualization). Balance handout provided to take home. Written material given at graduation. ?Flowsheet Row Cardiac Rehab from 03/20/2022 in [facility] Cardiac and Pulmonary Rehab  ?Date 02/27/22  ?Educator AS  ?Instruction Review Code 1- Verbalizes Understanding  ? ?  ? ? ?  Activity Barriers & Risk Stratification: ? Activity Barriers & Cardiac Risk Stratification - 01/16/22 1045   ? ?  ? Activity Barriers & Cardiac Risk Stratification  ? Activity Barriers Other (comment);Muscular Weakness;Deconditioning;Balance Concerns;Shortness of Breath   ? Comments PAD w/ stent- can slow him down some   ? Cardiac Risk Stratification High   ? ?  ?  ? ?  ? ? ?6 Minute Walk: ? 6 Minute Walk   ? ? [place] Name 01/16/22 1044  ?  ?  ?  ? 6 Minute Walk  ? Phase Initial    ? Distance 960 feet    ? Walk Time 6 minutes    ? # of Rest Breaks 0    ? MPH 1.82    ? METS 2.4    ? RPE 17    ? Perceived Dyspnea  2    ? VO2 Peak 8.4    ? Symptoms Yes (comment)    ? Comments PAD claudication (6/10, CPS 4/5), SOB    ? Resting HR 65 bpm    ? Resting BP 114/64    ? Resting Oxygen Saturation  97 %    ? Exercise Oxygen Saturation  during 6 min walk 96 %    ? Max Ex. HR 108 bpm    ? Max Ex. BP 142/84    ? 2 Minute Post BP 134/60    ? ?  ?  ? ?  ? ? ?Oxygen Initial  Assessment: ? ? ?Oxygen Re-Evaluation: ? ? ?Oxygen Discharge (Final Oxygen Re-Evaluation): ? ? ?Initial Exercise Prescription: ? Initial Exercise Prescription - 01/16/22 1000   ? ?  ? Date of Initial Exercise RX and Referring

## 2022-03-27 DIAGNOSIS — Z5189 Encounter for other specified aftercare: Secondary | ICD-10-CM | POA: Diagnosis not present

## 2022-03-27 DIAGNOSIS — Z951 Presence of aortocoronary bypass graft: Secondary | ICD-10-CM

## 2022-03-27 NOTE — Progress Notes (Signed)
Daily Session Note ? ?Patient Details  ?Name: Charles Blake ?MRN: [id number] ?Date of Birth: [birth year] ?Referring Provider:   ?Flowsheet Row Cardiac Rehab from 01/16/2022 in [facility] Cardiac and Pulmonary Rehab  ?Referring Provider Serafina Royals MD  ? ?  ? ? ?Encounter Date: 03/27/2022 ? ?Check In: ? Session Check In - 03/27/22 0914   ? ?  ? Check-In  ? Supervising physician immediately available to respond to emergencies See telemetry face sheet for immediately available ER MD   ? Location [facility]-Cardiac & Pulmonary Rehab   ? Staff Present Birdie Sons, MPA, RN;Amanda Sommer, BA, ACSM CEP, Exercise Physiologist;Aricela Bertagnolli Amedeo Plenty, BS, ACSM CEP, Exercise Physiologist   ? Virtual Visit No   ? Medication changes reported     Yes   ? Comments no longer taking metformin-he took self off and hasn't talked to doctor about this yet. advised to tell doctor   ? Fall or balance concerns reported    No   ? Tobacco Cessation No Change   ? Warm-up and Cool-down Performed on first and last piece of equipment   ? Resistance Training Performed Yes   ? VAD Patient? No   ? PAD/SET Patient? No   ?  ? Pain Assessment  ? Currently in Pain? No/denies   ? ?  ?  ? ?  ? ? ? ? ? ?Social History  ? ?Tobacco Use  ?Smoking Status Former  ? Packs/day: 0.50  ? Years: 40.00  ? Pack years: 20.00  ? Types: Cigarettes  ? Quit date: 11/29/2021  ? Years since quitting: 0.3  ?Smokeless Tobacco Former  ? ? ?Goals Met:  ?Independence with exercise equipment ?Exercise tolerated well ?No report of concerns or symptoms today ?Strength training completed today ? ?Goals Unmet:  ?Not Applicable ? ?Comments: Pt able to follow exercise prescription today without complaint.  Will continue to monitor for progression. ? ? ? ?Dr. Emily Filbert is Medical Director for [place].  ?Dr. Ottie Glazier is Medical Director for [facility] Pulmonary Rehabilitation. ?

## 2022-04-01 DIAGNOSIS — Z951 Presence of aortocoronary bypass graft: Secondary | ICD-10-CM

## 2022-04-01 DIAGNOSIS — Z5189 Encounter for other specified aftercare: Secondary | ICD-10-CM | POA: Diagnosis not present

## 2022-04-01 NOTE — Progress Notes (Signed)
Daily Session Note ? ?Patient Details  ?Name: Charles Blake ?MRN: [id number] ?Date of Birth: 18-Jul-1956 ?Referring Provider:   ?Flowsheet Row Cardiac Rehab from 01/16/2022 in [facility] Cardiac and Pulmonary Rehab  ?Referring Provider Serafina Royals MD  ? ?  ? ? ?Encounter Date: 04/01/2022 ? ?Check In: ? Session Check In - 04/01/22 0939   ? ?  ? Check-In  ? Supervising physician immediately available to respond to emergencies See telemetry face sheet for immediately available ER MD   ? Location [facility]-Cardiac & Pulmonary Rehab   ? Staff Present Birdie Sons, MPA, RN;Jessica Luan [place], MA, RCEP, CCRP, CCET;Joseph [place], [place]   ? Virtual Visit No   ? Medication changes reported     No   ? Fall or balance concerns reported    No   ? Tobacco Cessation No Change   ? Warm-up and Cool-down Performed on first and last piece of equipment   ? Resistance Training Performed Yes   ? VAD Patient? No   ? PAD/SET Patient? No   ?  ? Pain Assessment  ? Currently in Pain? No/denies   ? ?  ?  ? ?  ? ? ? ? ? ?Social History  ? ?Tobacco Use  ?Smoking Status Former  ? Packs/day: 0.50  ? Years: 40.00  ? Pack years: 20.00  ? Types: Cigarettes  ? Quit date: 11/29/2021  ? Years since quitting: 0.3  ?Smokeless Tobacco Former  ? ? ?Goals Met:  ?Independence with exercise equipment ?Exercise tolerated well ?No report of concerns or symptoms today ?Strength training completed today ? ?Goals Unmet:  ?Not Applicable ? ?Comments: Pt able to follow exercise prescription today without complaint.  Will continue to monitor for progression. ? ? ? ?Dr. Emily Filbert is Medical Director for [place].  ?Dr. Ottie Glazier is Medical Director for [facility] Pulmonary Rehabilitation. ?

## 2022-04-03 DIAGNOSIS — Z951 Presence of aortocoronary bypass graft: Secondary | ICD-10-CM

## 2022-04-03 DIAGNOSIS — Z5189 Encounter for other specified aftercare: Secondary | ICD-10-CM | POA: Diagnosis not present

## 2022-04-03 NOTE — Progress Notes (Signed)
Daily Session Note ? ?Patient Details  ?Name: Charles Blake ?MRN: [id number] ?Date of Birth: Aug 23, 1956 ?Referring Provider:   ?Flowsheet Row Cardiac Rehab from 01/16/2022 in [facility] Cardiac and Pulmonary Rehab  ?Referring Provider Serafina Royals MD  ? ?  ? ? ?Encounter Date: 04/03/2022 ? ?Check In: ? Session Check In - 04/03/22 0913   ? ?  ? Check-In  ? Supervising physician immediately available to respond to emergencies See telemetry face sheet for immediately available ER MD   ? Location [facility]-Cardiac & Pulmonary Rehab   ? Staff Present Birdie Sons, MPA, RN;Joseph [place], RCP,RRT,BSRT;Melissa [place], RDN, LDN   ? Virtual Visit No   ? Medication changes reported     No   ? Fall or balance concerns reported    No   ? Tobacco Cessation No Change   ? Warm-up and Cool-down Performed on first and last piece of equipment   ? Resistance Training Performed Yes   ? VAD Patient? No   ? PAD/SET Patient? No   ?  ? Pain Assessment  ? Currently in Pain? No/denies   ? ?  ?  ? ?  ? ? ? ? ? ?Social History  ? ?Tobacco Use  ?Smoking Status Former  ? Packs/day: 0.50  ? Years: 40.00  ? Pack years: 20.00  ? Types: Cigarettes  ? Quit date: 11/29/2021  ? Years since quitting: 0.3  ?Smokeless Tobacco Former  ? ? ?Goals Met:  ?Independence with exercise equipment ?Exercise tolerated well ?No report of concerns or symptoms today ?Strength training completed today ? ?Goals Unmet:  ?Not Applicable ? ?Comments: Pt able to follow exercise prescription today without complaint.  Will continue to monitor for progression. ? ? ? ?Dr. Emily Filbert is Medical Director for [place].  ?Dr. Ottie Glazier is Medical Director for [facility] Pulmonary Rehabilitation. ?

## 2022-04-08 ENCOUNTER — Encounter: Payer: Medicare Other | Attending: Internal Medicine

## 2022-04-08 VITALS — Ht 68.4 in | Wt 221.9 lb

## 2022-04-08 DIAGNOSIS — Z951 Presence of aortocoronary bypass graft: Secondary | ICD-10-CM | POA: Diagnosis present

## 2022-04-08 DIAGNOSIS — Z48812 Encounter for surgical aftercare following surgery on the circulatory system: Secondary | ICD-10-CM | POA: Diagnosis not present

## 2022-04-08 NOTE — Progress Notes (Signed)
Daily Session Note ? ?Patient Details  ?Name: Charles Blake ?MRN: [id number] ?Date of Birth: 10/24/1956 ?Referring Provider:   ?Flowsheet Row Cardiac Rehab from 01/16/2022 in [facility] Cardiac and Pulmonary Rehab  ?Referring Provider Serafina Royals MD  ? ?  ? ? ?Encounter Date: 04/08/2022 ? ?Check In: ? Session Check In - 04/08/22 3710   ? ?  ? Check-In  ? Supervising physician immediately available to respond to emergencies See telemetry face sheet for immediately available ER MD   ? Location [facility]-Cardiac & Pulmonary Rehab   ? Staff Present Birdie Sons, MPA, RN;Jessica [place], MA, RCEP, CCRP, [place], BS, ACSM CEP, Exercise Physiologist   ? Virtual Visit No   ? Medication changes reported     No   ? Fall or balance concerns reported    No   ? Tobacco Cessation No Change   ? Warm-up and Cool-down Performed on first and last piece of equipment   ? Resistance Training Performed Yes   ? VAD Patient? No   ? PAD/SET Patient? No   ?  ? Pain Assessment  ? Currently in Pain? No/denies   ? ?  ?  ? ?  ? ? ? ? ? ?Social History  ? ?Tobacco Use  ?Smoking Status Former  ? Packs/day: 0.50  ? Years: 40.00  ? Pack years: 20.00  ? Types: Cigarettes  ? Quit date: 11/29/2021  ? Years since quitting: 0.3  ?Smokeless Tobacco Former  ? ? ?Goals Met:  ?Independence with exercise equipment ?Exercise tolerated well ?No report of concerns or symptoms today ?Strength training completed today ? ?Goals Unmet:  ?Not Applicable ? ?Comments: Pt able to follow exercise prescription today without complaint.  Will continue to monitor for progression. ? ? 6 Minute Walk   ? ? [place] Name 01/16/22 1044 04/08/22 0929  ?  ?  ? 6 Minute Walk  ? Phase Initial Discharge   ? Distance 960 feet 1290 feet   ? Distance % Change -- 34.4 %   ? Distance Feet Change -- 330 ft   ? Walk Time 6 minutes 6 minutes   ? # of Rest Breaks 0 0   ? MPH 1.82 2.44   ? METS 2.4 2.87   ? RPE 17 13   ? Perceived Dyspnea  2 1   ? VO2 Peak 8.4 10.04   ? Symptoms Yes (comment) Yes  (comment)   ? Comments PAD claudication (6/10, CPS 4/5), SOB SOB   ? Resting HR 65 bpm 79 bpm   ? Resting BP 114/64 124/72   ? Resting Oxygen Saturation  97 % 92 %   ? Exercise Oxygen Saturation  during 6 min walk 96 % 93 %   ? Max Ex. HR 108 bpm 86 bpm   ? Max Ex. BP 142/84 168/84   ? 2 Minute Post BP 134/60 --   ? ?  ?  ? ?  ? ? ? ?Dr. Emily Filbert is Medical Director for [place].  ?Dr. Ottie Glazier is Medical Director for [facility] Pulmonary Rehabilitation. ?

## 2022-04-09 NOTE — Patient Instructions (Signed)
Discharge Patient Instructions ? ?Patient Details  ?Name: Charles Blake ?MRN: [id number] ?Date of Birth: 19-Jun-1956 ?Referring Provider:  Corey Skains, MD ? ? ?Number of Visits: 36 ? ?Reason for Discharge:  ?Patient reached a stable level of exercise. ?Patient independent in their exercise. ?Patient has met program and personal goals. ? ?Smoking History:  ?Social History  ? ?Tobacco Use  ?Smoking Status Former  ? Packs/day: 0.50  ? Years: 40.00  ? Pack years: 20.00  ? Types: Cigarettes  ? Quit date: 11/29/2021  ? Years since quitting: 0.3  ?Smokeless Tobacco Former  ? ? ?Diagnosis:  ?S/P CABG x 3 ? ?Initial Exercise Prescription: ? Initial Exercise Prescription - 01/16/22 1000   ? ?  ? Date of Initial Exercise RX and Referring Provider  ? Date 01/16/22   ? Referring Provider Serafina Royals MD   ?  ? Oxygen  ? Maintain Oxygen Saturation 88% or higher   ?  ? Treadmill  ? MPH 1.7   ? Grade 0   ? Minutes 15   ? METs 2.3   ?  ? Recumbant Bike  ? Level 1   ? RPM 50   ? Watts 12   ? Minutes 15   ? METs 2   ?  ? REL-XR  ? Level 1   ? Speed 50   ? Minutes 15   ? METs 2   ?  ? T5 Nustep  ? Level 1   ? SPM 80   ? Minutes 15   ? METs 2   ?  ? Track  ? Laps 23   ? Minutes 15   ? METs 2.25   ?  ? Prescription Details  ? Frequency (times per week) 2   ? Duration Progress to 30 minutes of continuous aerobic without signs/symptoms of physical distress   ?  ? Intensity  ? THRR 40-80% of Max Heartrate 101-136   ? Ratings of Perceived Exertion 11-13   ? Perceived Dyspnea 0-4   ?  ? Progression  ? Progression Continue to progress workloads to maintain intensity without signs/symptoms of physical distress.   ?  ? Resistance Training  ? Training Prescription Yes   ? Weight 4 lb   ? Reps 10-15   ? ?  ?  ? ?  ? ? ?Discharge Exercise Prescription (Final Exercise Prescription Changes): ? Exercise Prescription Changes - 03/31/22 1700   ? ?  ? Response to Exercise  ? Blood Pressure (Admit) 136/68   ? Blood Pressure (Exit) 130/68   ?  Heart Rate (Admit) 75 bpm   ? Heart Rate (Exercise) 91 bpm   ? Heart Rate (Exit) 88 bpm   ? Rating of Perceived Exertion (Exercise) 13   ? Symptoms none   ? Duration Continue with 30 min of aerobic exercise without signs/symptoms of physical distress.   ? Intensity THRR unchanged   ?  ? Progression  ? Progression Continue to progress workloads to maintain intensity without signs/symptoms of physical distress.   ? Average METs 2.8   ?  ? Resistance Training  ? Training Prescription Yes   ? Weight 8 lb   ? Reps 10-15   ?  ? Interval Training  ? Interval Training No   ?  ? Treadmill  ? MPH 1.5   ? Grade 2   ? Minutes 15   ? METs 2.56   ?  ? Recumbant Bike  ? Level 2   ? Watts  27   ? Minutes 15   ?  ? REL-XR  ? Level 1   ? Minutes 15   ? METs 4.2   ?  ? T5 Nustep  ? Level 1   ? Minutes 15   ? METs 2.3   ?  ? Track  ? Laps 30   ? Minutes 15   ? METs 2.63   ?  ? Oxygen  ? Maintain Oxygen Saturation 88% or higher   ? ?  ?  ? ?  ? ? ?Functional Capacity: ? 6 Minute Walk   ? ? [place] Name 01/16/22 1044 04/08/22 0929  ?  ?  ? 6 Minute Walk  ? Phase Initial Discharge   ? Distance 960 feet 1290 feet   ? Distance % Change -- 34.4 %   ? Distance Feet Change -- 330 ft   ? Walk Time 6 minutes 6 minutes   ? # of Rest Breaks 0 0   ? MPH 1.82 2.44   ? METS 2.4 2.87   ? RPE 17 13   ? Perceived Dyspnea  2 1   ? VO2 Peak 8.4 10.04   ? Symptoms Yes (comment) Yes (comment)   ? Comments PAD claudication (6/10, CPS 4/5), SOB SOB   ? Resting HR 65 bpm 79 bpm   ? Resting BP 114/64 124/72   ? Resting Oxygen Saturation  97 % 92 %   ? Exercise Oxygen Saturation  during 6 min walk 96 % 93 %   ? Max Ex. HR 108 bpm 86 bpm   ? Max Ex. BP 142/84 168/84   ? 2 Minute Post BP 134/60 --   ? ?  ?  ? ?  ? ? ? ?Nutrition & Weight - Outcomes: ? Pre Biometrics - 01/16/22 1049   ? ?  ? Pre Biometrics  ? Height 5' 8.4" (1.737 m)   ? Weight 217 lb 9.6 oz (98.7 kg)   ? BMI (Calculated) 32.71   ? Single Leg Stand 5.2 seconds   ? ?  ?  ? ?  ? ? Post Biometrics -  04/08/22 0930   ? ?  ?  Post  Biometrics  ? Height 5' 8.4" (1.737 m)   ? Weight 221 lb 14.4 oz (100.7 kg)   ? BMI (Calculated) 33.36   ? Single Leg Stand 23.5 seconds   ? ?  ?  ? ?  ? ? ?Nutrition: ? Nutrition Therapy & Goals - 02/06/22 0840   ? ?  ? Nutrition Therapy  ? Diet Heart healthy, low Na, T2DM friendly   ? Protein (specify units) 80g   ? Fiber 30 grams   ? Whole Grain Foods 3 servings   ? Saturated Fats 16 max. grams   ? Fruits and Vegetables 8 servings/day   ? Sodium 2 grams   ?  ? Personal Nutrition Goals  ? Nutrition Goal ST: swap out proteins at lunch (canned tuna, chicken on sandwiches), practice CHO counting and pairing foods, reduce sugar in gallon of sweet tea to 1 cup  LT: A1C <7, limit processed meat <3x/week, limit saturated fat <16g/day, eat at least 4 non-starchy vegetable servings per day, elimintate sweet tea most days   ? Comments 66 y.o. M admitted to rehab s/p CABG x3 also presenting with HTN, HLD, CAD, PAD, COPD, T2DM (2022). PMHx GERD, NSTEMI (2022), tobacco use. Relevant medications include coreg, lexapro, lasix, metformin, protonix, MVI. PYP Score: 58. Vegetables & Fruits  5/12. Breads, Grains & Cereals 8/12. Red & Processed Meat 7/12. Poultry 0/2. Fish & Shellfish 3/4. Beans, Nuts & Seeds 1/4. Milk & Dairy Foods 4/6. Toppings, Oils, Seasonings & Salt 11/20. Sweets, Snacks & Restaurant Food 9/14. Beverages 10/10. He will have grilled and baked chicken as well as fried chicken. B: bacon, sausage, ham with eggs (fried or scrambled with butter and some cheese). A couple of cups coffee (stevia and dried milk). L and D combined: 2-3 pm: frozen pizza, sandwich (whole wheat bread with mayo, mustard, ham), every once in a while will have fried fish (he does not like baked or grilled fish), chicken with potatoes and corn on the cob. Drinks: sweet tea - he has cut back and makes his own tea to cut back on the sugar - he would like to continue cutting back until he is drinking unsweet tea. He  also drinks zero fat lactaid.  He is willing to try whole wheat pasta to add an additional whole grain - he is unsure if his wife will like it and believes it will be harder to make changes without her on board. He eats some non-starchy vegetables like cabbage, but mostly starchy vegetables - suggested practicing CHO counting to get used to amounts of CHO rich food recommended for each meal and shifting focus to non-starchy vegetables. Also discussed pairing CHO rich foods with protein and fat; he suggested bananas with peanut butter. Suggested limiting processed meats, he knows they are not very good for his heart and is willing to swap proteins at lunch/dinner. Discussed heart healthy eating and diabetes friendly eating.   ?  ? Intervention Plan  ? Intervention Prescribe, educate and counsel regarding individualized specific dietary modifications aiming towards targeted core components such as weight, hypertension, lipid management, diabetes, heart failure and other comorbidities.   ? Expected Outcomes Short Term Goal: Understand basic principles of dietary content, such as calories, fat, sodium, cholesterol and nutrients.;Short Term Goal: A plan has been developed with personal nutrition goals set during dietitian appointment.;Long Term Goal: Adherence to prescribed nutrition plan.   ? ?  ?  ? ?  ? ? ? ?Goals reviewed with patient; copy given to patient. ?

## 2022-04-10 DIAGNOSIS — Z951 Presence of aortocoronary bypass graft: Secondary | ICD-10-CM

## 2022-04-10 NOTE — Progress Notes (Signed)
Daily Session Note ? ?Patient Details  ?Name: Charles Blake ?MRN: [id number] ?Date of Birth: 05-24-1956 ?Referring Provider:   ?Flowsheet Row Cardiac Rehab from 01/16/2022 in [facility] Cardiac and Pulmonary Rehab  ?Referring Provider Serafina Royals MD  ? ?  ? ? ?Encounter Date: 04/10/2022 ? ?Check In: ? Session Check In - 04/10/22 0924   ? ?  ? Check-In  ? Supervising physician immediately available to respond to emergencies See telemetry face sheet for immediately available ER MD   ? Location [facility]-Cardiac & Pulmonary Rehab   ? Staff Present Birdie Sons, MPA, RN;Melissa [place], RDN, LDN;Laureen Owens Shark, BS, RRT, CPFT   ? Virtual Visit No   ? Medication changes reported     No   ? Fall or balance concerns reported    No   ? Tobacco Cessation No Change   ? Warm-up and Cool-down Performed on first and last piece of equipment   ? Resistance Training Performed Yes   ? VAD Patient? No   ? PAD/SET Patient? No   ?  ? Pain Assessment  ? Currently in Pain? No/denies   ? ?  ?  ? ?  ? ? ? ? ? ?Social History  ? ?Tobacco Use  ?Smoking Status Former  ? Packs/day: 0.50  ? Years: 40.00  ? Pack years: 20.00  ? Types: Cigarettes  ? Quit date: 11/29/2021  ? Years since quitting: 0.3  ?Smokeless Tobacco Former  ? ? ?Goals Met:  ?Independence with exercise equipment ?Exercise tolerated well ?No report of concerns or symptoms today ?Strength training completed today ? ?Goals Unmet:  ?Not Applicable ? ?Comments: Pt able to follow exercise prescription today without complaint.  Will continue to monitor for progression. ? ? ? ?Dr. Emily Filbert is Medical Director for [place].  ?Dr. Ottie Glazier is Medical Director for [facility] Pulmonary Rehabilitation. ?

## 2022-04-15 DIAGNOSIS — Z951 Presence of aortocoronary bypass graft: Secondary | ICD-10-CM

## 2022-04-15 NOTE — Progress Notes (Signed)
Daily Session Note ? ?Patient Details  ?Name: Charles Blake ?MRN: [id number] ?Date of Birth: Jan 15, 1956 ?Referring Provider:   ?Flowsheet Row Cardiac Rehab from 01/16/2022 in [facility] Cardiac and Pulmonary Rehab  ?Referring Provider Serafina Royals MD  ? ?  ? ? ?Encounter Date: 04/15/2022 ? ?Check In: ? Session Check In - 04/15/22 0927   ? ?  ? Check-In  ? Supervising physician immediately available to respond to emergencies See telemetry face sheet for immediately available ER MD   ? Location [facility]-Cardiac & Pulmonary Rehab   ? Staff Present Birdie Sons, MPA, RN;Jessica [place], MA, RCEP, CCRP, [place], BS, ACSM CEP, Exercise Physiologist;Kara Eliezer Bottom, MS, ASCM CEP, Exercise Physiologist   ? Virtual Visit No   ? Medication changes reported     No   ? Fall or balance concerns reported    No   ? Tobacco Cessation No Change   ? Warm-up and Cool-down Performed on first and last piece of equipment   ? Resistance Training Performed Yes   ? VAD Patient? No   ? PAD/SET Patient? No   ?  ? Pain Assessment  ? Currently in Pain? No/denies   ? ?  ?  ? ?  ? ? ? ? ? ?Social History  ? ?Tobacco Use  ?Smoking Status Former  ? Packs/day: 0.50  ? Years: 40.00  ? Pack years: 20.00  ? Types: Cigarettes  ? Quit date: 11/29/2021  ? Years since quitting: 0.3  ?Smokeless Tobacco Former  ? ? ?Goals Met:  ?Independence with exercise equipment ?Exercise tolerated well ?No report of concerns or symptoms today ?Strength training completed today ? ?Goals Unmet:  ?Not Applicable ? ?Comments: Pt able to follow exercise prescription today without complaint.  Will continue to monitor for progression. ? ? ? ?Dr. Emily Filbert is Medical Director for [place].  ?Dr. Ottie Glazier is Medical Director for [facility] Pulmonary Rehabilitation. ?

## 2022-04-17 ENCOUNTER — Encounter: Payer: Medicare Other | Admitting: *Deleted

## 2022-04-17 DIAGNOSIS — Z951 Presence of aortocoronary bypass graft: Secondary | ICD-10-CM | POA: Diagnosis not present

## 2022-04-17 NOTE — Progress Notes (Signed)
Discharge Note   Keoki Mchargue  February 27, 1956 ?Alvey graduated today from  rehab with 36 sessions completed.  Details of the patient's exercise prescription and what He needs to do in order to continue the prescription and progress were discussed with patient.  Patient was given a copy of prescription and goals.  Patient verbalized understanding.  Josimar plans to continue to exercise by exercise at home treadmill bike and stepper at home. ? ? 6 Minute Walk   ? ? [place] Name 01/16/22 1044 04/08/22 0929  ?  ?  ? 6 Minute Walk  ? Phase Initial Discharge   ? Distance 960 feet 1290 feet   ? Distance % Change -- 34.4 %   ? Distance Feet Change -- 330 ft   ? Walk Time 6 minutes 6 minutes   ? # of Rest Breaks 0 0   ? MPH 1.82 2.44   ? METS 2.4 2.87   ? RPE 17 13   ? Perceived Dyspnea  2 1   ? VO2 Peak 8.4 10.04   ? Symptoms Yes (comment) Yes (comment)   ? Comments PAD claudication (6/10, CPS 4/5), SOB SOB   ? Resting HR 65 bpm 79 bpm   ? Resting BP 114/64 124/72   ? Resting Oxygen Saturation  97 % 92 %   ? Exercise Oxygen Saturation  during 6 min walk 96 % 93 %   ? Max Ex. HR 108 bpm 86 bpm   ? Max Ex. BP 142/84 168/84   ? 2 Minute Post BP 134/60 --   ? ?  ?  ? ?  ? Thank you for the referral. We enjoyed working with [employer]. ?

## 2022-04-17 NOTE — Progress Notes (Signed)
Cardiac Individual Treatment Plan ? ?Patient Details  ?Name: Charles Blake ?MRN: [id number] ?Date of Birth: Mar 09, 1956 ?Referring Provider:   ?Flowsheet Row Cardiac Rehab from 01/16/2022 in [facility] Cardiac and Pulmonary Rehab  ?Referring Provider Serafina Royals MD  ? ?  ? ? ?Initial Encounter Date:  ?Flowsheet Row Cardiac Rehab from 01/16/2022 in [facility] Cardiac and Pulmonary Rehab  ?Date 01/16/22  ? ?  ? ? ?Visit Diagnosis: S/P CABG x 3 ? ?Patient's Home Medications on Admission: ? ?Current Outpatient Medications:  ?  acetaminophen (TYLENOL) 650 MG CR tablet, Take 650 mg by mouth every 8 (eight) hours as needed for pain., Disp: , Rfl:  ?  albuterol (PROVENTIL HFA;VENTOLIN HFA) 108 (90 Base) MCG/ACT inhaler, Inhale 2 puffs into the lungs every 4 (four) hours as needed for wheezing., Disp: 1 Inhaler, Rfl: 0 ?  amLODipine (NORVASC) 5 MG tablet, Take 5 mg by mouth daily., Disp: , Rfl:  ?  aspirin EC 81 MG EC tablet, Take 1 tablet (81 mg total) by mouth daily. Swallow whole., Disp: , Rfl:  ?  carvedilol (COREG) 6.25 MG tablet, Take 6.25 mg by mouth 2 (two) times daily with a meal., Disp: , Rfl:  ?  clopidogrel (PLAVIX) 75 MG tablet, Take 1 tablet by mouth once daily (Patient taking differently: Take 75 mg by mouth every evening.), Disp: 90 tablet, Rfl: 0 ?  escitalopram (LEXAPRO) 10 MG tablet, Take 10 mg by mouth in the morning., Disp: , Rfl:  ?  furosemide (LASIX) 20 MG tablet, Take 20 mg by mouth daily., Disp: , Rfl:  ?  heparin 25000 UT/250ML infusion, Inject 1,500 Units/hr into the vein continuous. (Patient not taking: Reported on 01/03/2022), Disp: , Rfl:  ?  isosorbide mononitrate (IMDUR) 60 MG 24 hr tablet, Take 60 mg by mouth daily. (Patient not taking: Reported on 01/03/2022), Disp: , Rfl:  ?  ketoconazole (NIZORAL) 2 % shampoo, as needed., Disp: , Rfl:  ?  KLOR-CON M20 20 MEQ tablet, Take 40 mEq by mouth 2 (two) times daily., Disp: , Rfl:  ?  lovastatin (MEVACOR) 40 MG tablet, Take 40 mg by mouth at bedtime., Disp: ,  Rfl:  ?  lovastatin (MEVACOR) 40 MG tablet, Take 1 tablet by mouth daily with supper., Disp: , Rfl:  ?  Melatonin 3 MG CAPS, Take 3 mg by mouth at bedtime., Disp: , Rfl:  ?  metFORMIN (GLUCOPHAGE) 500 MG tablet, Take 500 mg by mouth 2 (two) times daily., Disp: , Rfl:  ?  Multiple Vitamin (MULTI-VITAMINS) TABS, Take 1 tablet by mouth daily., Disp: , Rfl:  ?  niacin (SLO-NIACIN) 500 MG tablet, Take 500 mg by mouth at bedtime., Disp: , Rfl:  ?  nitroGLYCERIN (NITROSTAT) 0.4 MG SL tablet, Place 1 tablet (0.4 mg total) under the tongue every 5 (five) minutes as needed for chest pain. (Patient not taking: Reported on 01/03/2022), Disp: , Rfl:  ?  pantoprazole (PROTONIX) 40 MG tablet, Take 40 mg by mouth daily., Disp: , Rfl:  ?  telmisartan-hydrochlorothiazide (MICARDIS HCT) 80-12.5 MG tablet, Take 1 tablet by mouth daily. (Patient not taking: Reported on 01/03/2022), Disp: , Rfl: 3 ?  umeclidinium-vilanterol (ANORO ELLIPTA) 62.5-25 MCG/ACT AEPB, Inhale 1 puff into the lungs daily., Disp: , Rfl:  ? ?Past Medical History: ?Past Medical History:  ?Diagnosis Date  ? Anemia   ? Anxiety   ? Arthritis   ? fingers and hands  ? Cancer [facility])   ? skin cancer  ? COPD (chronic obstructive pulmonary  disease) ([place])   ? wheezing  ? Coronary artery disease   ? Dyspnea   ? GERD (gastroesophageal reflux disease)   ? Headache   ? 2-3 times/ week  ? History of hiatal hernia   ? HOH (hard of hearing)   ? Hypercholesteremia   ? Hypertension   ? controlled  ? Pneumonia   ? Sleep apnea   ? CPAP  ? Wears dentures   ? upper  ? ? ?Tobacco Use: ?Social History  ? ?Tobacco Use  ?Smoking Status Former  ? Packs/day: 0.50  ? Years: 40.00  ? Pack years: 20.00  ? Types: Cigarettes  ? Quit date: 11/29/2021  ? Years since quitting: 0.3  ?Smokeless Tobacco Former  ? ? ?Labs: ?Review Flowsheet   ? ?    ? View : No data to display.  ?  ?  ?  ?  ?  ? ? ? ?Exercise Target Goals: ?Exercise Program Goal: ?Individual exercise prescription set using results from  initial 6 min walk test and THRR while considering  patient?s activity barriers and safety.  ? ?Exercise Prescription Goal: ?Initial exercise prescription builds to 30-45 minutes a day of aerobic activity, 2-3 days per week.  Home exercise guidelines will be given to patient during program as part of exercise prescription that the participant will acknowledge. ? ? ?Education: Aerobic Exercise: ?- Group verbal and visual presentation on the components of exercise prescription. Introduces F.I.T.T principle from ACSM for exercise prescriptions.  Reviews F.I.T.T. principles of aerobic exercise including progression. Written material given at graduation. ?Flowsheet Row Cardiac Rehab from 04/10/2022 in [facility] Cardiac and Pulmonary Rehab  ?Education need identified 01/16/22  ?Date 04/10/22  ?Educator [facility]  ?Instruction Review Code 1- Verbalizes Understanding  ? ?  ? ? ?Education: Resistance Exercise: ?- Group verbal and visual presentation on the components of exercise prescription. Introduces F.I.T.T principle from ACSM for exercise prescriptions  Reviews F.I.T.T. principles of resistance exercise including progression. Written material given at graduation. ?Flowsheet Row Cardiac Rehab from 04/10/2022 in [facility] Cardiac and Pulmonary Rehab  ?Date 02/13/22  ?Educator AS  ?Instruction Review Code 1- Verbalizes Understanding  ? ?  ? ?  ?Education: Exercise & Equipment Safety: ?- Individual verbal instruction and demonstration of equipment use and safety with use of the equipment. ?Flowsheet Row Cardiac Rehab from 04/10/2022 in [facility] Cardiac and Pulmonary Rehab  ?Date 01/16/22  ?Educator [facility]  ?Instruction Review Code 1- Verbalizes Understanding  ? ?  ? ? ?Education: Exercise Physiology & General Exercise Guidelines: ?- Group verbal and written instruction with models to review the exercise physiology of the cardiovascular system and associated critical values. Provides general exercise guidelines with specific guidelines to those with heart  or lung disease.  ?Flowsheet Row Cardiac Rehab from 04/10/2022 in [facility] Cardiac and Pulmonary Rehab  ?Date 01/30/22  ?Educator AS  ?Instruction Review Code 1- Verbalizes Understanding  ? ?  ? ? ?Education: Flexibility, Balance, Mind/Body Relaxation: ?- Group verbal and visual presentation with interactive activity on the components of exercise prescription. Introduces F.I.T.T principle from ACSM for exercise prescriptions. Reviews F.I.T.T. principles of flexibility and balance exercise training including progression. Also discusses the mind body connection.  Reviews various relaxation techniques to help reduce and manage stress (i.e. Deep breathing, progressive muscle relaxation, and visualization). Balance handout provided to take home. Written material given at graduation. ?Flowsheet Row Cardiac Rehab from 04/10/2022 in [facility] Cardiac and Pulmonary Rehab  ?Date 02/27/22  ?Educator AS  ?Instruction Review Code 1- Verbalizes Understanding  ? ?  ? ? ?  Activity Barriers & Risk Stratification: ? Activity Barriers & Cardiac Risk Stratification - 01/16/22 1045   ? ?  ? Activity Barriers & Cardiac Risk Stratification  ? Activity Barriers Other (comment);Muscular Weakness;Deconditioning;Balance Concerns;Shortness of Breath   ? Comments PAD w/ stent- can slow him down some   ? Cardiac Risk Stratification High   ? ?  ?  ? ?  ? ? ?6 Minute Walk: ? 6 Minute Walk   ? ? [place] Name 01/16/22 1044 04/08/22 0929  ?  ?  ? 6 Minute Walk  ? Phase Initial Discharge   ? Distance 960 feet 1290 feet   ? Distance % Change -- 34.4 %   ? Distance Feet Change -- 330 ft   ? Walk Time 6 minutes 6 minutes   ? # of Rest Breaks 0 0   ? MPH 1.82 2.44   ? METS 2.4 2.87   ? RPE 17 13   ? Perceived Dyspnea  2 1   ? VO2 Peak 8.4 10.04   ? Symptoms Yes (comment) Yes (comment)   ? Comments PAD claudication (6/10, CPS 4/5), SOB SOB   ? Resting HR 65 bpm 79 bpm   ? Resting BP 114/64 124/72   ? Resting Oxygen Saturation  97 % 92 %   ? Exercise Oxygen Saturation   during 6 min walk 96 % 93 %   ? Max Ex. HR 108 bpm 86 bpm   ? Max Ex. BP 142/84 168/84   ? 2 Minute Post BP 134/60 --   ? ?  ?  ? ?  ? ? ?Oxygen Initial Assessment: ? ? ?Oxygen Re-Evaluation: ? ? ?Oxygen Discharg

## 2022-04-17 NOTE — Progress Notes (Signed)
Daily Session Note ? ?Patient Details  ?Name: Charles Blake ?MRN: [id number] ?Date of Birth: 1956/05/08 ?Referring Provider:   ?Flowsheet Row Cardiac Rehab from 01/16/2022 in [facility] Cardiac and Pulmonary Rehab  ?Referring Provider Serafina Royals MD  ? ?  ? ? ?Encounter Date: 04/17/2022 ? ?Check In: ? Session Check In - 04/17/22 0952   ? ?  ? Check-In  ? Supervising physician immediately available to respond to emergencies See telemetry face sheet for immediately available ER MD   ? Location [facility]-Cardiac & Pulmonary Rehab   ? Staff Present Heath Lark, RN, BSN, CCRP;Melissa [place], RDN, LDN;Joseph [place], [place]   ? Virtual Visit No   ? Medication changes reported     No   ? Fall or balance concerns reported    No   ? Warm-up and Cool-down Performed on first and last piece of equipment   ? Resistance Training Performed Yes   ? VAD Patient? No   ? PAD/SET Patient? No   ?  ? Pain Assessment  ? Currently in Pain? No/denies   ? ?  ?  ? ?  ? ? ? ? ? ?Social History  ? ?Tobacco Use  ?Smoking Status Former  ? Packs/day: 0.50  ? Years: 40.00  ? Pack years: 20.00  ? Types: Cigarettes  ? Quit date: 11/29/2021  ? Years since quitting: 0.3  ?Smokeless Tobacco Former  ? ? ?Goals Met:  ?Independence with exercise equipment ?Exercise tolerated well ?Personal goals reviewed ?No report of concerns or symptoms today ? ?Goals Unmet:  ?Not Applicable ? ?Comments:  Charles Blake graduated today from  rehab with 36 sessions completed.  Details of the patient's exercise prescription and what He needs to do in order to continue the prescription and progress were discussed with patient.  Patient was given a copy of prescription and goals.  Patient verbalized understanding.  Charles Blake plans to continue to exercise by exercise at home treadmill bike and stepper at home. ? ? ? ?Dr. Emily Filbert is Medical Director for [place].  ?Dr. Ottie Glazier is Medical Director for [facility] Pulmonary Rehabilitation. ?

## 2022-04-23 DIAGNOSIS — I10 Essential (primary) hypertension: Secondary | ICD-10-CM | POA: Insufficient documentation

## 2022-05-11 IMAGING — CR DG CHEST 2V
1 series · 2 of 2 positions shown · non-contrast
Comparison: 08/03/2020

CLINICAL DATA: Chest pain.  COPD.

EXAM:
CHEST - 2 VIEW

[Series 1: dg chest 2 view · 0.14mm/px · 2 of 2 slices shown]
[im 1/2]
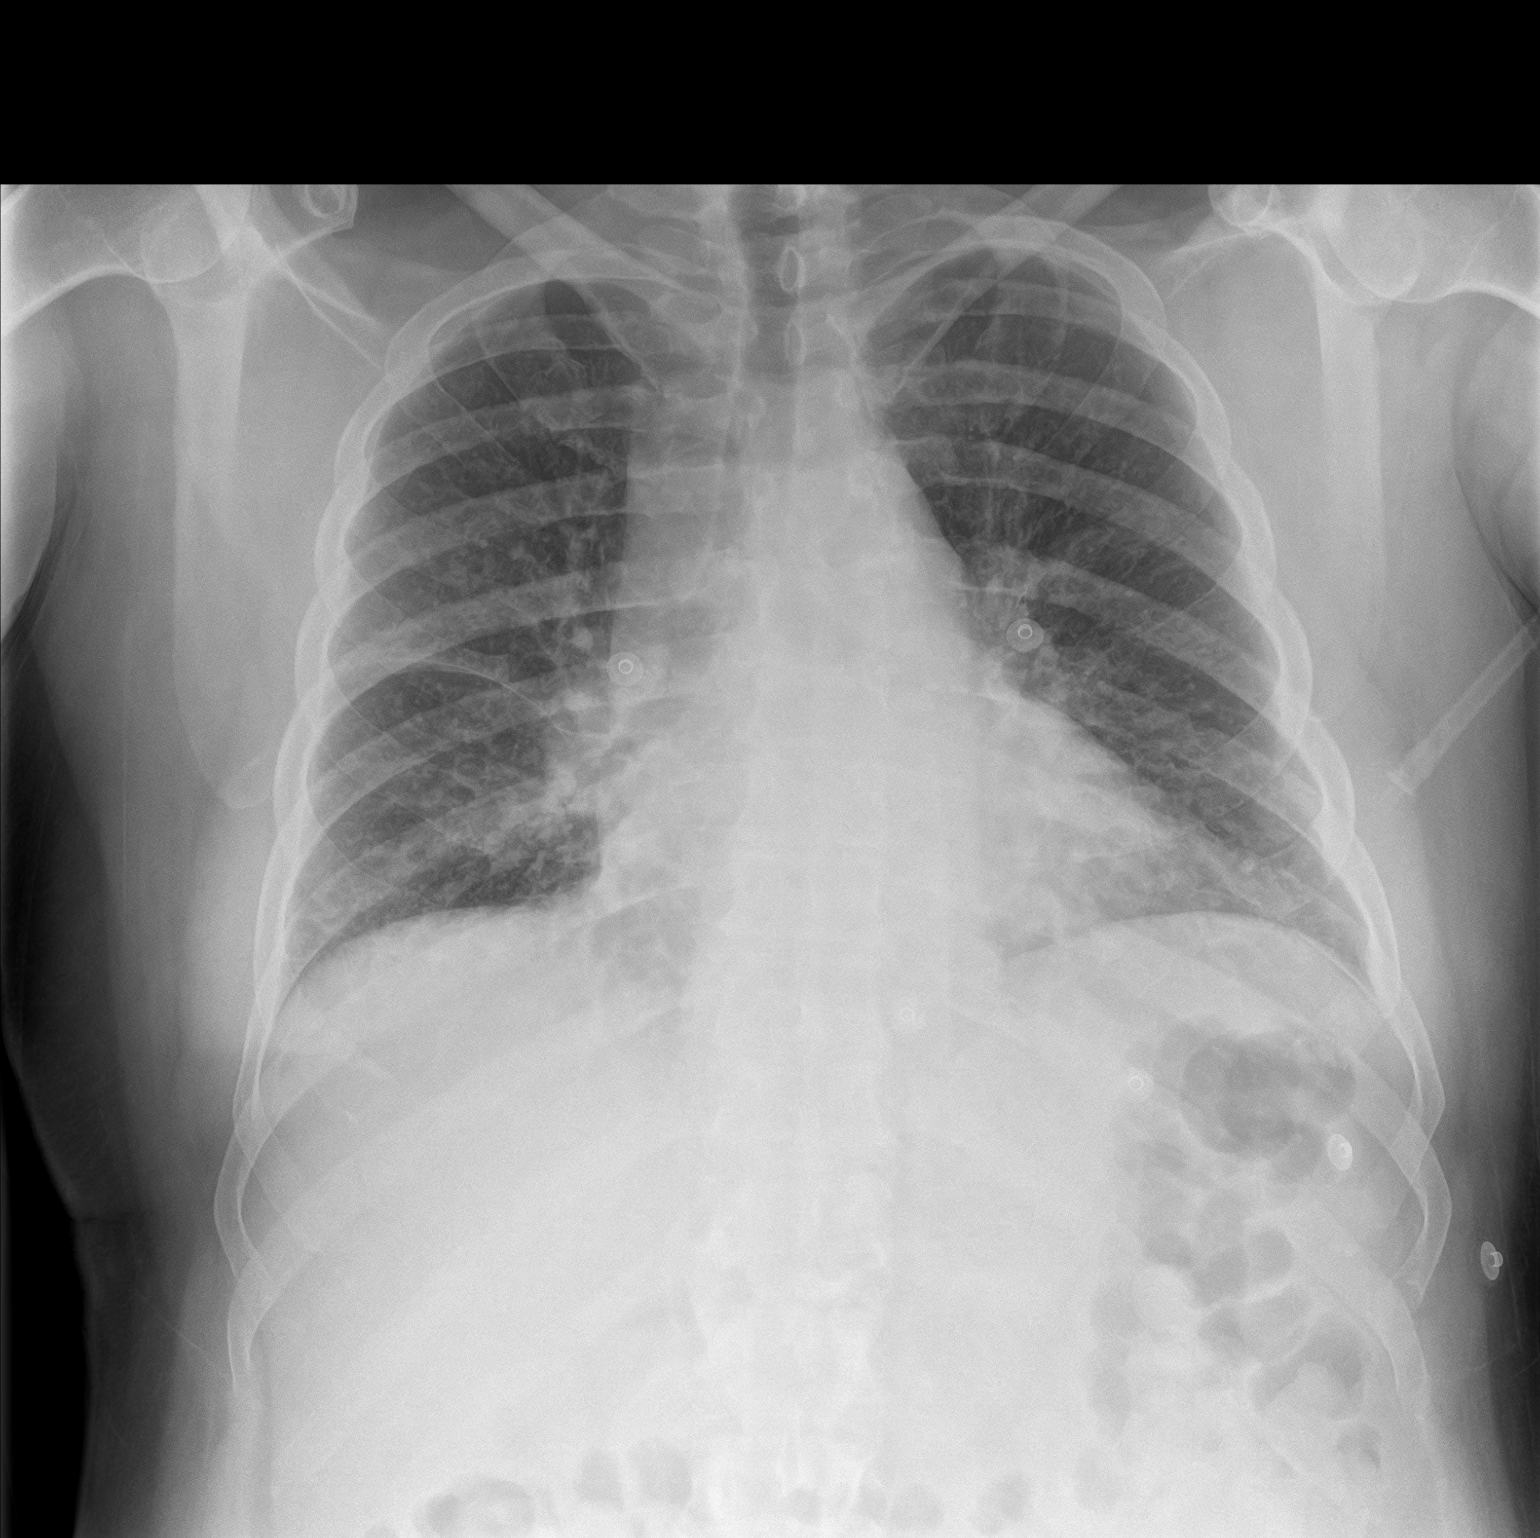
[im 2/2]
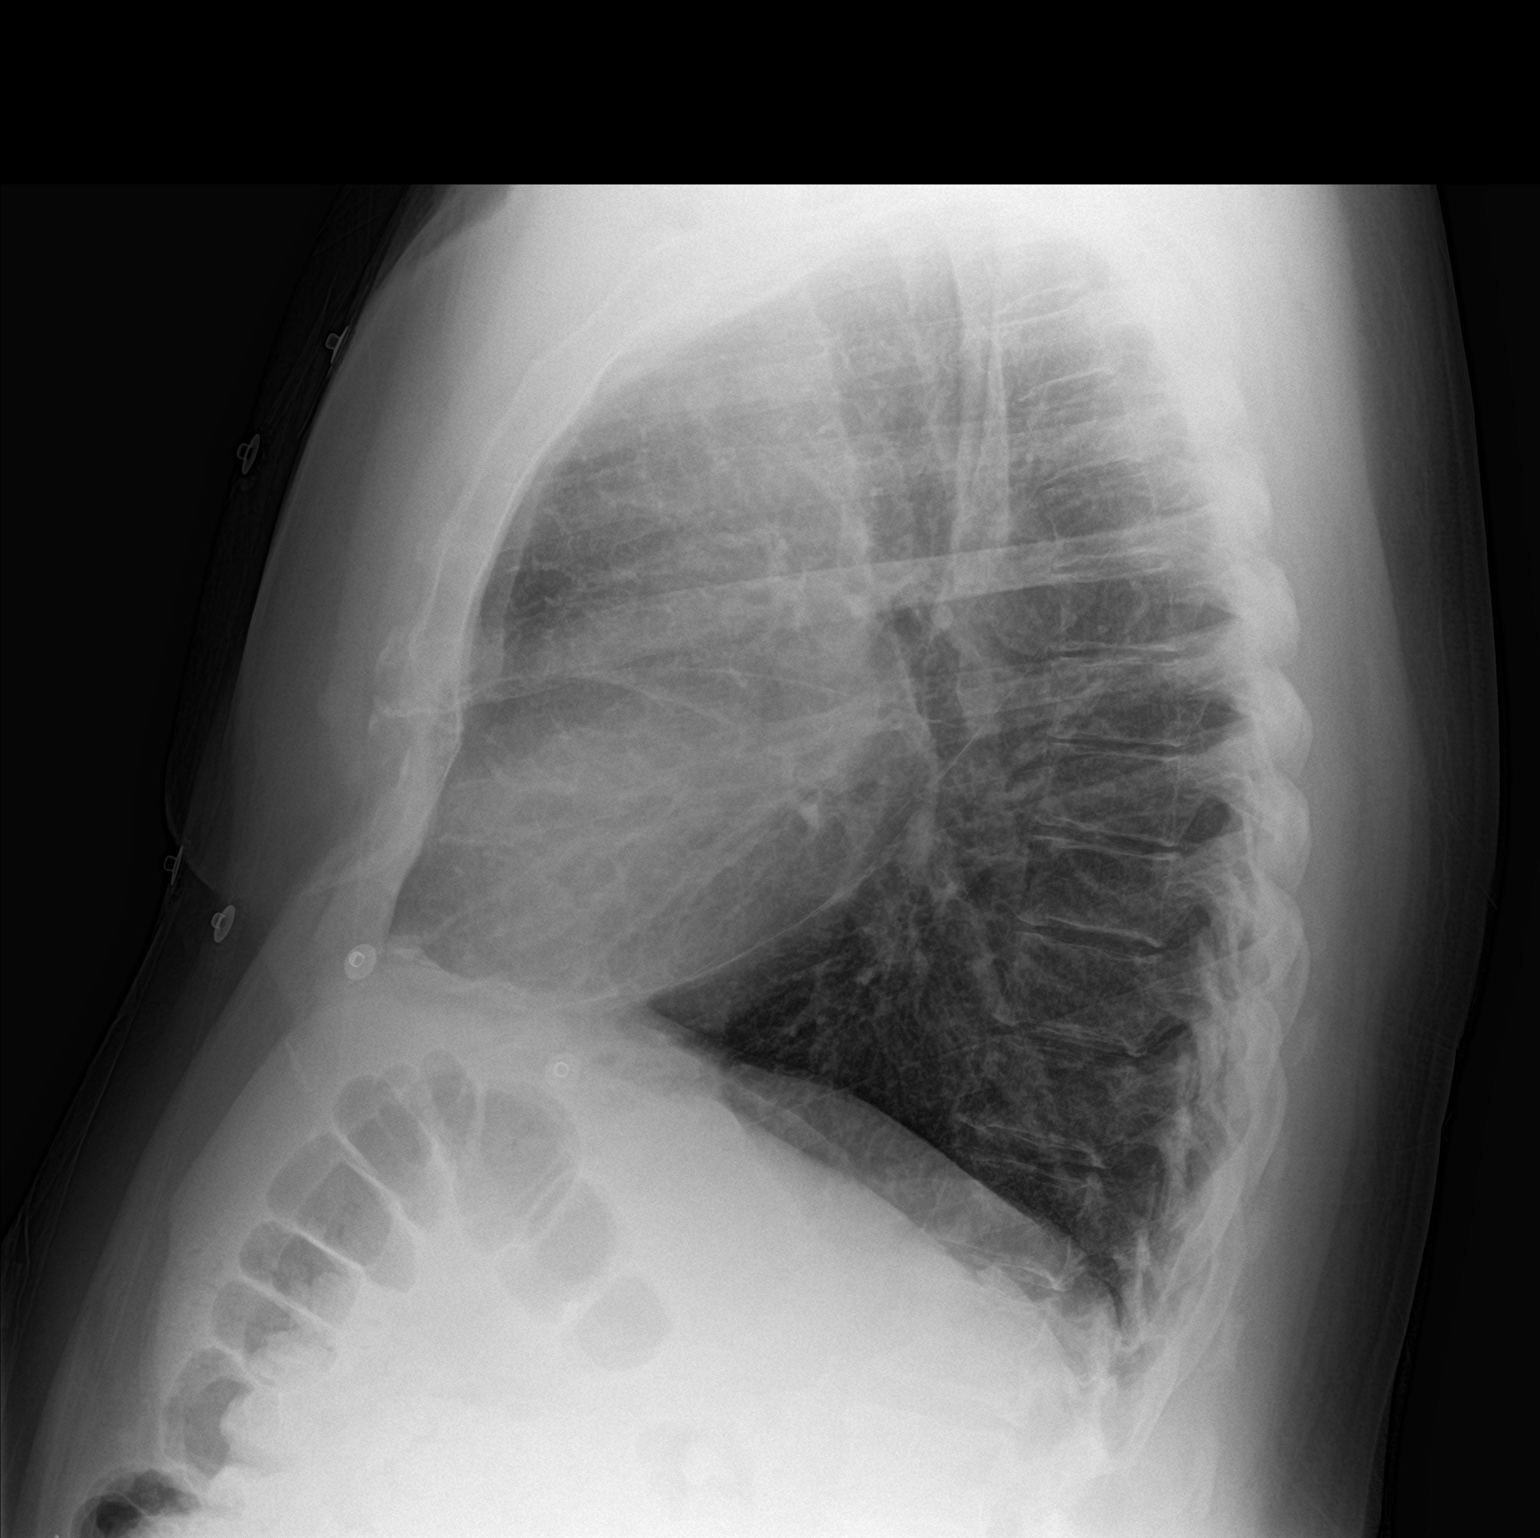

[2 of 2 positions shown; findings below may reference images not displayed]

FINDINGS: The heart size and mediastinal contours are within normal limits.
Low lung volumes are noted. Both lungs are clear. No evidence of
pleural effusion. The visualized skeletal structures are
unremarkable.
IMPRESSION: Low lung volumes. No active cardiopulmonary disease.

## 2022-07-21 ENCOUNTER — Other Ambulatory Visit (INDEPENDENT_AMBULATORY_CARE_PROVIDER_SITE_OTHER): Payer: Self-pay | Admitting: Vascular Surgery

## 2022-07-21 DIAGNOSIS — I6523 Occlusion and stenosis of bilateral carotid arteries: Secondary | ICD-10-CM

## 2022-07-21 DIAGNOSIS — Z9889 Other specified postprocedural states: Secondary | ICD-10-CM

## 2022-07-22 ENCOUNTER — Ambulatory Visit (INDEPENDENT_AMBULATORY_CARE_PROVIDER_SITE_OTHER): Payer: Medicare Other

## 2022-07-22 ENCOUNTER — Encounter (INDEPENDENT_AMBULATORY_CARE_PROVIDER_SITE_OTHER): Payer: Self-pay | Admitting: Vascular Surgery

## 2022-07-22 ENCOUNTER — Ambulatory Visit (INDEPENDENT_AMBULATORY_CARE_PROVIDER_SITE_OTHER): Payer: Medicare Other | Admitting: Vascular Surgery

## 2022-07-22 VITALS — BP 142/66 | HR 71 | Resp 18 | Ht 67.0 in | Wt 230.0 lb

## 2022-07-22 DIAGNOSIS — E785 Hyperlipidemia, unspecified: Secondary | ICD-10-CM

## 2022-07-22 DIAGNOSIS — Z9889 Other specified postprocedural states: Secondary | ICD-10-CM | POA: Diagnosis not present

## 2022-07-22 DIAGNOSIS — I6522 Occlusion and stenosis of left carotid artery: Secondary | ICD-10-CM | POA: Diagnosis not present

## 2022-07-22 DIAGNOSIS — I739 Peripheral vascular disease, unspecified: Secondary | ICD-10-CM

## 2022-07-22 DIAGNOSIS — I10 Essential (primary) hypertension: Secondary | ICD-10-CM | POA: Diagnosis not present

## 2022-07-22 DIAGNOSIS — I700 Atherosclerosis of aorta: Secondary | ICD-10-CM

## 2022-07-22 DIAGNOSIS — I6523 Occlusion and stenosis of bilateral carotid arteries: Secondary | ICD-10-CM

## 2022-07-22 NOTE — Progress Notes (Signed)
MRN : [id number]  Charles Blake is a 66 y.o. (26-Apr-1956) male who presents with chief complaint of No chief complaint on file. Marland Kitchen  History of Present Illness: Patient returns today in follow-up of multiple vascular issues.  He has had a coronary bypass since his last visit.  No recurrent claudication or rest pain symptoms.  He is status post aortoiliac intervention about a decade ago with a recurrent intervention about 8 years ago.  No recurrent focal neurologic symptoms since his last visit.  He remains on aspirin and Plavix and is tolerating this well.  He is also on a statin agent. Duplex today shows the previously placed iliac stents to be widely patent without significant recurrent stenosis.  The aorta is of normal diameter on duplex today.  ABIs are 1.09 on the right and 1.10 on the left with triphasic waveforms and normal digital pressures and waveforms consistent with no arterial insufficiency status post revascularization many years ago. He is also status post left carotid stent placement roughly 2 years ago for recurrent stenosis after an endarterectomy almost a decade ago.  His duplex today shows 1 to 39% right ICA stenosis and a widely patent left carotid artery stent.   Current Outpatient Medications  Medication Sig Dispense Refill   acetaminophen (TYLENOL) 650 MG CR tablet Take 650 mg by mouth every 8 (eight) hours as needed for pain.     albuterol (PROVENTIL HFA;VENTOLIN HFA) 108 (90 Base) MCG/ACT inhaler Inhale 2 puffs into the lungs every 4 (four) hours as needed for wheezing. 1 Inhaler 0   amLODipine (NORVASC) 5 MG tablet Take 5 mg by mouth daily.     carvedilol (COREG) 6.25 MG tablet Take 6.25 mg by mouth 2 (two) times daily with a meal.     clopidogrel (PLAVIX) 75 MG tablet Take 1 tablet by mouth once daily (Patient taking differently: Take 75 mg by mouth every evening.) 90 tablet 0   escitalopram (LEXAPRO) 10 MG tablet Take 10 mg by mouth in the morning.     furosemide  (LASIX) 20 MG tablet Take 20 mg by mouth daily.     ketoconazole (NIZORAL) 2 % shampoo as needed.     lovastatin (MEVACOR) 40 MG tablet Take 40 mg by mouth at bedtime.     Melatonin 3 MG CAPS Take 3 mg by mouth at bedtime.     metFORMIN (GLUCOPHAGE) 500 MG tablet Take 500 mg by mouth 2 (two) times daily with a meal.     Multiple Vitamin (MULTI-VITAMINS) TABS Take 1 tablet by mouth daily.     niacin (SLO-NIACIN) 500 MG tablet Take 500 mg by mouth at bedtime.     nitroGLYCERIN (NITROSTAT) 0.4 MG SL tablet Place 1 tablet (0.4 mg total) under the tongue every 5 (five) minutes as needed for chest pain.     pantoprazole (PROTONIX) 40 MG tablet Take 40 mg by mouth daily.     umeclidinium-vilanterol (ANORO ELLIPTA) 62.5-25 MCG/ACT AEPB Inhale 1 puff into the lungs daily.     valsartan (DIOVAN) 160 MG tablet Take 160 mg by mouth 2 (two) times daily.     No current facility-administered medications for this visit.    Past Medical History:  Diagnosis Date   Anemia    Anxiety    Arthritis    fingers and hands   Cancer (HCC)    skin cancer   COPD (chronic obstructive pulmonary disease) (HCC)    wheezing   Coronary artery disease  Dyspnea    GERD (gastroesophageal reflux disease)    Headache    2-3 times/ week   History of hiatal hernia    HOH (hard of hearing)    Hypercholesteremia    Hypertension    controlled   Pneumonia    Sleep apnea    CPAP   Wears dentures    upper    Past Surgical History:  Procedure Laterality Date   APPENDECTOMY     CARDIAC CATHETERIZATION     CAROTID ENDARTERECTOMY Left    CAROTID PTA/STENT INTERVENTION Left 08/20/2020   Procedure: CAROTID PTA/STENT INTERVENTION;  Surgeon: Algernon Huxley, MD;  Location: [facility];  Service: Cardiovascular;  Laterality: Left;   CATARACT EXTRACTION Left    CATARACT EXTRACTION W/PHACO Right 05/24/2018   Procedure: CATARACT EXTRACTION PHACO AND INTRAOCULAR LENS PLACEMENT ([place]) RIGHT;  Surgeon: Eulogio Bear,  MD;  Location: [facility];  Service: Ophthalmology;  Laterality: Right;  CPAP, sleep apnea   COLONOSCOPY N/A 05/29/2015   Procedure: COLONOSCOPY;  Surgeon: Lollie Sails, MD;  Location: [facility];  Service: Endoscopy;  Laterality: N/A;   iliac stents     LEFT HEART CATH AND CORONARY ANGIOGRAPHY N/A 12/03/2021   Procedure: LEFT HEART CATH AND CORONARY ANGIOGRAPHY and possible PCI and stent;  Surgeon: Corey Skains, MD;  Location: [facility];  Service: Cardiovascular;  Laterality: N/A;   TONSILLECTOMY       Social History   Tobacco Use   Smoking status: Former    Packs/day: 0.50    Years: 40.00    Total pack years: 20.00    Types: Cigarettes    Quit date: 11/29/2021    Years since quitting: 0.6   Smokeless tobacco: Former  Scientific laboratory technician Use: Never used  Substance Use Topics   Alcohol use: Yes    Comment: rarely   Drug use: Never       Family History  Problem Relation Age of Onset   Heart attack Mother    Hypertension Mother    Kidney disease Mother    Heart attack Father    Hypertension Father    Colon cancer Father    Heart attack Brother      Allergies  Allergen Reactions   Rosuvastatin     Other reaction(s): Other (See Comments) Extreme joint pain & fatigue      REVIEW OF SYSTEMS (Negative unless checked)  Constitutional: '[]'$ Weight loss  '[]'$ Fever  '[]'$ Chills Cardiac: '[]'$ Chest pain   '[]'$ Chest pressure   '[]'$ Palpitations   '[]'$ Shortness of breath when laying flat   '[]'$ Shortness of breath at rest   '[]'$ Shortness of breath with exertion. Vascular:  '[x]'$ Pain in legs with walking   '[]'$ Pain in legs at rest   '[]'$ Pain in legs when laying flat   '[]'$ Claudication   '[]'$ Pain in feet when walking  '[]'$ Pain in feet at rest  '[]'$ Pain in feet when laying flat   '[]'$ History of DVT   '[]'$ Phlebitis   '[]'$ Swelling in legs   '[]'$ Varicose veins   '[]'$ Non-healing ulcers Pulmonary:   '[]'$ Uses home oxygen   '[]'$ Productive cough   '[]'$ Hemoptysis   '[]'$ Wheeze  '[]'$ COPD   '[]'$ Asthma Neurologic:   '[]'$ Dizziness  '[]'$ Blackouts   '[]'$ Seizures   '[]'$ History of stroke   '[]'$ History of TIA  '[]'$ Aphasia   '[]'$ Temporary blindness   '[]'$ Dysphagia   '[]'$ Weakness or numbness in arms   '[]'$ Weakness or numbness in legs Musculoskeletal:  '[x]'$ Arthritis   '[]'$ Joint swelling   '[]'$ Joint pain   '[]'$   Low back pain Hematologic:  '[]'$ Easy bruising  '[]'$ Easy bleeding   '[]'$ Hypercoagulable state   '[]'$ Anemic  '[]'$ Hepatitis Gastrointestinal:  '[]'$ Blood in stool   '[]'$ Vomiting blood  '[x]'$ Gastroesophageal reflux/heartburn   '[]'$ Difficulty swallowing. Genitourinary:  '[]'$ Chronic kidney disease   '[]'$ Difficult urination  '[]'$ Frequent urination  '[]'$ Burning with urination   '[]'$ Blood in urine Skin:  '[]'$ Rashes   '[]'$ Ulcers   '[]'$ Wounds Psychological:  '[]'$ History of anxiety   '[]'$  History of major depression.  Physical Examination  Vitals:   07/22/22 0836  BP: (!) 142/66  Pulse: 71  Resp: 18  Weight: 230 lb (104.3 kg)  Height: '5\' 7"'$  (1.702 m)   Body mass index is 36.02 kg/m. Gen:  WD/WN, NAD Head: [place]/AT, No temporalis wasting. Ear/Nose/Throat: Hearing grossly intact, nares w/o erythema or drainage, trachea midline Eyes: Conjunctiva clear. Sclera non-icteric Neck: Supple.  No bruit  Pulmonary:  Good air movement, equal and clear to auscultation bilaterally.  Cardiac: RRR, No JVD Vascular:  Vessel Right Left  Radial Palpable Palpable       Musculoskeletal: M/S 5/5 throughout.  No deformity or atrophy. No LE edema. Neurologic: CN 2-12 intact. Sensation grossly intact in extremities.  Symmetrical.  Speech is fluent. Motor exam as listed above. Psychiatric: Judgment intact, Mood & affect appropriate for pt's clinical situation. Dermatologic: No rashes or ulcers noted.  No cellulitis or open wounds.     CBC Lab Results  Component Value Date   WBC 9.5 12/03/2021   HGB 13.8 12/03/2021   HCT 39.2 12/03/2021   MCV 92.0 12/03/2021   PLT 204 12/03/2021    BMET    Component Value Date/Time   NA 137 12/03/2021 0416   NA 139 03/02/2014 0430   K 3.8 12/03/2021  0416   K 3.9 03/02/2014 0430   CL 105 12/03/2021 0416   CL 106 03/02/2014 0430   CO2 24 12/03/2021 0416   CO2 29 03/02/2014 0430   GLUCOSE 125 (H) 12/03/2021 0416   GLUCOSE 123 (H) 03/02/2014 0430   BUN 20 12/03/2021 0416   BUN 13 03/02/2014 0430   CREATININE 0.99 12/03/2021 0416   CREATININE 0.96 03/02/2014 0430   CALCIUM 8.6 (L) 12/03/2021 0416   CALCIUM 7.6 (L) 03/02/2014 0430   GFRNONAA >60 12/03/2021 0416   GFRNONAA >60 03/02/2014 0430   GFRAA >60 08/21/2020 0427   GFRAA >60 03/02/2014 0430   CrCl cannot be calculated (Patient's most recent lab result is older than the maximum 21 days allowed.).  COAG Lab Results  Component Value Date   INR 1.0 11/29/2021   INR 1.1 03/02/2014    Radiology No results found.   Assessment/Plan Hypertension blood pressure control important in reducing the progression of atherosclerotic disease. On appropriate oral medications.     Aortic atherosclerosis ([place]) Status post aortoiliac intervention several years ago and doing well.   Hyperlipidemia lipid control important in reducing the progression of atherosclerotic disease. Continue statin therapy  PAD (peripheral artery disease) (HCC) Duplex today shows the previously placed iliac stents to be widely patent without significant recurrent stenosis.  The aorta is of normal diameter on duplex today.  ABIs are 1.09 on the right and 1.10 on the left with triphasic waveforms and normal digital pressures and waveforms consistent with no arterial insufficiency status post revascularization many years ago.  Continue current medical regimen.  Continue to follow annually with noninvasive studies.  Carotid stenosis, left His duplex today shows 1 to 39% right ICA stenosis and a widely patent left carotid artery stent.  Continue current medical regimen which she is tolerating well as above.  Continue to follow this annually with duplex.    Leotis Pain, MD  07/22/2022 9:11 AM    This note was  created with Dragon medical transcription system.  Any errors from dictation are purely unintentional

## 2022-07-22 NOTE — Assessment & Plan Note (Signed)
Duplex today shows the previously placed iliac stents to be widely patent without significant recurrent stenosis.  The aorta is of normal diameter on duplex today.  ABIs are 1.09 on the right and 1.10 on the left with triphasic waveforms and normal digital pressures and waveforms consistent with no arterial insufficiency status post revascularization many years ago.  Continue current medical regimen.  Continue to follow annually with noninvasive studies.

## 2022-07-22 NOTE — Assessment & Plan Note (Signed)
His duplex today shows 1 to 39% right ICA stenosis and a widely patent left carotid artery stent.  Continue current medical regimen which she is tolerating well as above.  Continue to follow this annually with duplex.

## 2022-07-31 NOTE — Progress Notes (Unsigned)
[facility] [place], [place] [address]  Pulmonary Sleep Medicine   Office Visit Note  Patient Name: Charles Blake DOB: 11-15-1956 MRN [id number]    Chief Complaint: Obstructive Sleep Apnea visit  Brief History:  Charles Blake is seen today for an annual follow up on CPAP at 20 cmh20.  The patient has a 7 year history of sleep apnea. Patient is using PAP nightly.  The patient feels resting after sleeping with PAP.  The patient reports benefiting from PAP use. Reported sleepiness is  improved and the Epworth Sleepiness Score is 0 out of 24. The patient does not take naps. The patient complains of the following: The patient changed his PAP pressure to 18 cmh20 due to his high leak on 20 cmh20.  He feels better with the pressure of 18 cmh20, he has less leak that disturbs his sleep.  The compliance download shows 95% compliance with an average use time of 9 hours 23 minutes. The AHI is 1.6.  The patient does not complain of limb movements disrupting sleep.  ROS  General: (-) fever, (-) chills, (-) night sweat Nose and Sinuses: (-) nasal stuffiness or itchiness, (-) postnasal drip, (-) nosebleeds, (-) sinus trouble. Mouth and Throat: (-) sore throat, (-) hoarseness. Neck: (-) swollen glands, (-) enlarged thyroid, (-) neck pain. Respiratory: - cough, +shortness of breath, - wheezing. Neurologic: - numbness, - tingling. Psychiatric: - anxiety, - depression   Current Medication: Outpatient Encounter Medications as of 08/04/2022  Medication Sig   acetaminophen (TYLENOL) 650 MG CR tablet Take 650 mg by mouth every 8 (eight) hours as needed for pain.   albuterol (PROVENTIL HFA;VENTOLIN HFA) 108 (90 Base) MCG/ACT inhaler Inhale 2 puffs into the lungs every 4 (four) hours as needed for wheezing.   amLODipine (NORVASC) 5 MG tablet Take 5 mg by mouth daily.   carvedilol (COREG) 6.25 MG tablet Take 6.25 mg by mouth 2 (two) times daily with a meal.   clopidogrel (PLAVIX) 75  MG tablet Take 1 tablet by mouth once daily (Patient taking differently: Take 75 mg by mouth every evening.)   escitalopram (LEXAPRO) 10 MG tablet Take 10 mg by mouth in the morning.   furosemide (LASIX) 20 MG tablet Take 20 mg by mouth daily.   ketoconazole (NIZORAL) 2 % shampoo as needed.   lovastatin (MEVACOR) 40 MG tablet Take 40 mg by mouth at bedtime.   Melatonin 3 MG CAPS Take 3 mg by mouth at bedtime.   metFORMIN (GLUCOPHAGE) 500 MG tablet Take 500 mg by mouth 2 (two) times daily with a meal.   Multiple Vitamin (MULTI-VITAMINS) TABS Take 1 tablet by mouth daily.   niacin (SLO-NIACIN) 500 MG tablet Take 500 mg by mouth at bedtime.   nitroGLYCERIN (NITROSTAT) 0.4 MG SL tablet Place 1 tablet (0.4 mg total) under the tongue every 5 (five) minutes as needed for chest pain.   pantoprazole (PROTONIX) 40 MG tablet Take 40 mg by mouth daily.   umeclidinium-vilanterol (ANORO ELLIPTA) 62.5-25 MCG/ACT AEPB Inhale 1 puff into the lungs daily.   valsartan (DIOVAN) 160 MG tablet Take 160 mg by mouth 2 (two) times daily.   No facility-administered encounter medications on file as of 08/04/2022.    Surgical History: Past Surgical History:  Procedure Laterality Date   APPENDECTOMY     CARDIAC CATHETERIZATION     CAROTID ENDARTERECTOMY Left    CAROTID PTA/STENT INTERVENTION Left 08/20/2020   Procedure: CAROTID PTA/STENT INTERVENTION;  Surgeon: Algernon Huxley, MD;  Location: [facility];  Service: Cardiovascular;  Laterality: Left;   CATARACT EXTRACTION Left    CATARACT EXTRACTION W/PHACO Right 05/24/2018   Procedure: CATARACT EXTRACTION PHACO AND INTRAOCULAR LENS PLACEMENT ([place]) RIGHT;  Surgeon: Eulogio Bear, MD;  Location: [facility];  Service: Ophthalmology;  Laterality: Right;  CPAP, sleep apnea   COLONOSCOPY N/A 05/29/2015   Procedure: COLONOSCOPY;  Surgeon: Lollie Sails, MD;  Location: [facility];  Service: Endoscopy;  Laterality: N/A;   iliac stents     LEFT HEART  CATH AND CORONARY ANGIOGRAPHY N/A 12/03/2021   Procedure: LEFT HEART CATH AND CORONARY ANGIOGRAPHY and possible PCI and stent;  Surgeon: Corey Skains, MD;  Location: [facility];  Service: Cardiovascular;  Laterality: N/A;   TONSILLECTOMY      Medical History: Past Medical History:  Diagnosis Date   Anemia    Anxiety    Arthritis    fingers and hands   Cancer (HCC)    skin cancer   COPD (chronic obstructive pulmonary disease) (HCC)    wheezing   Coronary artery disease    Dyspnea    GERD (gastroesophageal reflux disease)    Headache    2-3 times/ week   History of hiatal hernia    HOH (hard of hearing)    Hypercholesteremia    Hypertension    controlled   Pneumonia    Sleep apnea    CPAP   Wears dentures    upper    Family History: Non contributory to the present illness  Social History: Social History   Socioeconomic History   Marital status: Married    Spouse name: Bretta Bang    Number of children: 1   Years of education: Not on file   Highest education level: Not on file  Occupational History   Occupation: retired   Tobacco Use   Smoking status: Former    Packs/day: 0.50    Years: 40.00    Total pack years: 20.00    Types: Cigarettes    Quit date: 11/29/2021    Years since quitting: 0.6   Smokeless tobacco: Former  Scientific laboratory technician Use: Never used  Substance and Sexual Activity   Alcohol use: Yes    Comment: rarely   Drug use: Never   Sexual activity: Not on file  Other Topics Concern   Not on file  Social History Narrative   Lives at home with wife.    Social Determinants of Health   Financial Resource Strain: Not on file  Food Insecurity: Not on file  Transportation Needs: Not on file  Physical Activity: Not on file  Stress: Not on file  Social Connections: Not on file  Intimate Partner Violence: Not on file    Vital Signs: Blood pressure (!) 149/78, pulse 68, resp. rate 16, height '5\' 7"'$  (1.702 m), weight 231  lb 6.4 oz (105 kg), SpO2 98 %. Body mass index is 36.24 kg/m.    Examination: General Appearance: The patient is well-developed, well-nourished, and in no distress. Neck Circumference: 53 cm Skin: Gross inspection of skin unremarkable. Head: normocephalic, no gross deformities. Eyes: no gross deformities noted. ENT: ears appear grossly normal Neurologic: Alert and oriented. No involuntary movements.  STOP BANG RISK ASSESSMENT S (snore) Have you been told that you snore?     NO   T (tired) Are you often tired, fatigued, or sleepy during the day?   NO  O (obstruction) Do you stop breathing,  choke, or gasp during sleep? NO   P (pressure) Do you have or are you being treated for high blood pressure? YES   B (BMI) Is your body index greater than 35 kg/m? YES   A (age) Are you 28 years old or older? YES   N (neck) Do you have a neck circumference greater than 16 inches?   YES   G (gender) Are you a male? YES   TOTAL STOP/BANG "YES" ANSWERS 5       A STOP-Bang score of 2 or less is considered low risk, and a score of 5 or more is high risk for having either moderate or severe OSA. For people who score 3 or 4, doctors may need to perform further assessment to determine how likely they are to have OSA.         EPWORTH SLEEPINESS SCALE:  Scale:  (0)= no chance of dozing; (1)= slight chance of dozing; (2)= moderate chance of dozing; (3)= high chance of dozing  Chance  Situtation    Sitting and reading: 0    Watching TV: 0    Sitting Inactive in public: 0    As a passenger in car: 0      Lying down to rest: 0    Sitting and talking: 0    Sitting quielty after lunch: 0    In a car, stopped in traffic: 0   TOTAL SCORE:   0 out of 24    SLEEP STUDIES:  PSG (07/21/16)  AHI 13.4, SUPINE AHI 22, REM AHI 64, SPO2 81% TITRATION (08/12/16)  CPAP AT 20 CMH20   CPAP COMPLIANCE DATA:  Date Range: 07/31/21 - 07/30/22  Average Daily Use: 9 hours 23 minutes  Median  Use: 9 hours 48 minutes  Compliance for > 4 Hours: 347 days  AHI: 1.6 respiratory events per hour  Days Used: 347/365  Mask Leak: 58.7  95th Percentile Pressure: 18 cmh20         LABS: No results found for this or any previous visit (from the past 2160 hour(s)).  Radiology: CARDIAC CATHETERIZATION  Result Date: 12/03/2021   Prox RCA lesion is 90% stenosed.   Prox Cx lesion is 75% stenosed.   Mid LM lesion is 85% stenosed.   Ost LAD lesion is 60% stenosed.   The left ventricular systolic function is normal.   LV end diastolic pressure is normal.   The left ventricular ejection fraction is greater than 65% by visual estimate. 66 year old male with hypertension hyper kalemia family history of cardiovascular disease and intolerance to medical management for lipidemia having a non-ST elevation myocardial infarction Normal LV function with ejection fraction of 65% Critical distal left main coronary atherosclerosis of 85% with a critical stenosis of proximal right coronary artery and proximal circumflex artery After discussion with the cardiology team decision for bypass surgery was confirmed Plan Transfer for coronary artery bypass grafting due to severe left main coronary atherosclerosis and 2 other vessel coronary artery disease High intensity cholesterol therapy Beta-blocker for hypertension control Nitrates for chest pain Continue heparin intravenously    No results found.  VAS US CAROTID  Result Date: 07/22/2022 Carotid Arterial Duplex Study Patient Name:  KEMAURI MUSA  Date of Exam:   07/22/2022 Medical Rec #: [id number]          Accession #:    [id number] Date of Birth: 1956/03/04           Patient Gender: M Patient Age:   28  years Exam Location:  [facility] Procedure:      VAS US CAROTID Referring Phys: Corene Cornea DEW --------------------------------------------------------------------------------  Indications:       Carotid artery disease. Other Factors:     Left CEA  2015                    08/20/2020 lt ica stent. Comparison Study:  07/2021 Performing Technologist: Concha Norway RVT  Examination Guidelines: A complete evaluation includes B-mode imaging, spectral Doppler, color Doppler, and power Doppler as needed of all accessible portions of each vessel. Bilateral testing is considered an integral part of a complete examination. Limited examinations for reoccurring indications may be performed as noted.  Right Carotid Findings: +----------+--------+--------+--------+------------------+--------+           PSV cm/sEDV cm/sStenosisPlaque DescriptionComments +----------+--------+--------+--------+------------------+--------+ CCA Prox  52      8                                          +----------+--------+--------+--------+------------------+--------+ CCA Mid   73      14                                         +----------+--------+--------+--------+------------------+--------+ CCA Distal75      16                                         +----------+--------+--------+--------+------------------+--------+ ICA Prox  96      21      1-39%   calcific                   +----------+--------+--------+--------+------------------+--------+ ICA Mid   90      19                                         +----------+--------+--------+--------+------------------+--------+ ICA Distal106     19                                         +----------+--------+--------+--------+------------------+--------+ ECA       100     11                                         +----------+--------+--------+--------+------------------+--------+ +----------+--------+-------+----------------+-------------------+           PSV cm/sEDV cmsDescribe        Arm Pressure (mmHG) +----------+--------+-------+----------------+-------------------+ [id number]            Multiphasic, WNL                     +----------+--------+-------+----------------+-------------------+ +---------+--------+--+--------+--+---------+ VertebralPSV cm/s72EDV cm/s11Antegrade +---------+--------+--+--------+--+---------+  Left Carotid Findings: +----------+--------+--------+--------+------------------+--------+           PSV cm/sEDV cm/sStenosisPlaque DescriptionComments +----------+--------+--------+--------+------------------+--------+ CCA Prox  70      17                                         +----------+--------+--------+--------+------------------+--------+  CCA Mid   68      15                                         +----------+--------+--------+--------+------------------+--------+ CCA Distal74      15                                stent    +----------+--------+--------+--------+------------------+--------+ ICA Prox  65      15                                stent    +----------+--------+--------+--------+------------------+--------+ ICA Mid   70      14                                         +----------+--------+--------+--------+------------------+--------+ ICA Distal68      17                                         +----------+--------+--------+--------+------------------+--------+ ECA       99      14                                         +----------+--------+--------+--------+------------------+--------+ +----------+--------+--------+----------------+-------------------+           PSV cm/sEDV cm/sDescribe        Arm Pressure (mmHG) +----------+--------+--------+----------------+-------------------+ [id number]             Multiphasic, WNL                    +----------+--------+--------+----------------+-------------------+ +---------+--------+--+--------+--+---------+ VertebralPSV cm/s62EDV cm/s13Antegrade +---------+--------+--+--------+--+---------+   Summary: Right Carotid: Velocities in the right ICA are consistent with a 1-39%  stenosis.                The ECA appears <50% stenosed. Left Carotid: ICA and stent widely patent with no evidence of restenosis. Vertebrals:  Bilateral vertebral arteries demonstrate antegrade flow. Subclavians: Normal flow hemodynamics were seen in bilateral subclavian              arteries. *See table(s) above for measurements and observations.  Electronically signed by Leotis Pain MD on 07/22/2022 at 4:41:26 PM.    Final    VAS US AORTA/IVC/ILIACS  Result Date: 07/22/2022 ABDOMINAL AORTA STUDY Patient Name:  LAVAN IMES  Date of Exam:   07/22/2022 Medical Rec #: [id number]          Accession #:    [id number] Date of Birth: 16-Feb-1956           Patient Gender: M Patient Age:   4 years Exam Location:  [facility] Procedure:      VAS US AORTA/IVC/ILIACS Referring Phys: Leotis Pain --------------------------------------------------------------------------------  Vascular Interventions: 05/27/12: Bilateral common iliac artery PTA/stents;                         01/19/14: distal aorta & bilateral common iliac artery  angioplasties.  Comparison Study: 07/2021 Performing Technologist: Concha Norway RVT  Examination Guidelines: A complete evaluation includes B-mode imaging, spectral Doppler, color Doppler, and power Doppler as needed of all accessible portions of each vessel. Bilateral testing is considered an integral part of a complete examination. Limited examinations for reoccurring indications may be performed as noted.  Abdominal Aorta Findings: +-------------+-------+----------+----------+--------+--------+--------+ Location     AP (cm)Trans (cm)PSV (cm/s)WaveformThrombusComments +-------------+-------+----------+----------+--------+--------+--------+ Proximal     2.13   2.12      84                                 +-------------+-------+----------+----------+--------+--------+--------+ Mid          1.41   1.56      115                                 +-------------+-------+----------+----------+--------+--------+--------+ Distal       1.16   1.22      116                                +-------------+-------+----------+----------+--------+--------+--------+ RT CIA Prox  0.8    0.8       146       biphasic                 +-------------+-------+----------+----------+--------+--------+--------+ RT CIA Mid                    143       biphasic                 +-------------+-------+----------+----------+--------+--------+--------+ RT CIA Distal                 140       biphasic                 +-------------+-------+----------+----------+--------+--------+--------+ RT EIA Prox                   103                                +-------------+-------+----------+----------+--------+--------+--------+ RT EIA Mid                    79                                 +-------------+-------+----------+----------+--------+--------+--------+ RT EIA Distal                 55                                 +-------------+-------+----------+----------+--------+--------+--------+ LT CIA Prox  1.0    0.9       195       biphasic                 +-------------+-------+----------+----------+--------+--------+--------+ LT CIA Mid                    134       biphasic                 +-------------+-------+----------+----------+--------+--------+--------+  LT CIA Distal                 155       biphasic                 +-------------+-------+----------+----------+--------+--------+--------+ LT EIA Prox                   132       biphasic                 +-------------+-------+----------+----------+--------+--------+--------+ LT EIA Mid                    110       biphasic                 +-------------+-------+----------+----------+--------+--------+--------+ LT EIA Distal                 85        biphasic                  +-------------+-------+----------+----------+--------+--------+--------+  Summary: Abdominal Aorta: No evidence of an abdominal aortic aneurysm was visualized. The largest aortic measurement is 2.1 cm. Mild atherosclerosis. Evidence of patent bilateral CIA stents.  *See table(s) above for measurements and observations.  Electronically signed by Leotis Pain MD on 07/22/2022 at 4:41:08 PM.    Final    VAS [place] ABI WITH/WO TBI  Result Date: 07/22/2022  LOWER EXTREMITY DOPPLER STUDY Patient Name:  DAMARKUS BALIS  Date of Exam:   07/22/2022 Medical Rec #: [id number]          Accession #:    [id number] Date of Birth: [birth year]           Patient Gender: M Patient Age:   52 years Exam Location:  [facility] Procedure:      VAS [place] ABI WITH/WO TBI Referring Phys: Leotis Pain --------------------------------------------------------------------------------  Indications: Claudication, and peripheral artery disease.  Vascular Interventions: 05/27/12: Bilateral common iliac artery PTA/stents;                         01/19/14: distal aorta & bilateral common iliac artery                         angioplasties. Performing Technologist: Concha Norway RVT  Examination Guidelines: A complete evaluation includes at minimum, Doppler waveform signals and systolic blood pressure reading at the level of bilateral brachial, anterior tibial, and posterior tibial arteries, when vessel segments are accessible. Bilateral testing is considered an integral part of a complete examination. Photoelectric Plethysmograph (PPG) waveforms and toe systolic pressure readings are included as required and additional duplex testing as needed. Limited examinations for reoccurring indications may be performed as noted.  ABI Findings: +---------+------------------+-----+---------+--------+ Right    Rt Pressure (mmHg)IndexWaveform Comment  +---------+------------------+-----+---------+--------+ Brachial 138                                       +---------+------------------+-----+---------+--------+ ATA      152               1.09 triphasic         +---------+------------------+-----+---------+--------+ PTA      148               1.06 triphasic         +---------+------------------+-----+---------+--------+  Great Toe144               1.03 Normal            +---------+------------------+-----+---------+--------+ +---------+------------------+-----+---------+-------+ Left     Lt Pressure (mmHg)IndexWaveform Comment +---------+------------------+-----+---------+-------+ Brachial 140                                     +---------+------------------+-----+---------+-------+ ATA      145               1.04 triphasic        +---------+------------------+-----+---------+-------+ PTA      154               1.10 triphasic        +---------+------------------+-----+---------+-------+ Great Toe150               1.07 Normal           +---------+------------------+-----+---------+-------+ +-------+-----------+-----------+------------+------------+ ABI/TBIToday's ABIToday's TBIPrevious ABIPrevious TBI +-------+-----------+-----------+------------+------------+ Right  1.09       1.03       1.06        .96          +-------+-----------+-----------+------------+------------+ Left   1.10       1.07       1.10        .94          +-------+-----------+-----------+------------+------------+  Bilateral ABIs and TBIs appear essentially unchanged compared to prior study on 07/2021.  Summary: Right: Resting right ankle-brachial index is within normal range. The right toe-brachial index is normal. Left: Resting left ankle-brachial index is within normal range. The left toe-brachial index is normal. *See table(s) above for measurements and observations.  Electronically signed by Leotis Pain MD on 07/22/2022 at 4:40:44 PM.    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 12/07/2021   S/P CABG x 3 12/07/2021   Current smoker 12/03/2021   NSTEMI (non-ST elevated myocardial infarction) ([place]) 11/29/2021   OSA on CPAP 08/05/2021   CPAP use counseling 08/05/2021   Obesity (BMI 30-39.9) 08/05/2021   COPD (chronic obstructive pulmonary disease) ([place]) 11/05/2020   Severe tobacco use disorder 11/05/2020   Carotid stenosis, left 08/20/2020   Hyperlipidemia 07/06/2018   Hypertension 07/07/2017   Aortic atherosclerosis ([place]) 07/07/2017   Carotid artery stenosis 07/07/2017   PAD (peripheral artery disease) ([place]) 07/07/2017   Benign essential hypertension 06/14/2015   Coronary artery disease 02/06/2015   Complete left bundle branch block 01/19/2015   Psoriasis 01/10/2015   OSA (obstructive sleep apnea) 12/19/2014   Anxiety 07/06/2014   GERD (gastroesophageal reflux disease) 07/06/2014   Fracture of metatarsal of left foot, open 12/06/2013   Peripheral artery disease ([place]) 12/08/2012   1. OSA on CPAP The patient  tolerate PAP and reports  benefit from PAP use.He lowered his pressure on his own but he is doing better with his AHI. His machine is at end of life and must be replaced.  The patient was reminded how to clean equipment and advised to replace supplies routinely. The patient was also counselled on weight loss. The compliance is excellent. The AHI is 1.6.   OSA  on cpap- CPAP continues to be medically necessary to treat this patient's OSA.  Replace machine. F/u 30d after set up.    2. CPAP use counseling CPAP Counseling: had a lengthy discussion with the patient regarding the importance of PAP therapy in management of the sleep apnea. Patient appears to understand the risk factor reduction and also understands the risks associated with  untreated sleep apnea. Patient will try to make a good faith effort to remain compliant with therapy. Also instructed the patient on proper cleaning of the device including the water must be changed daily if possible and use of distilled water is preferred. Patient understands that the machine should be regularly cleaned with appropriate recommended cleaning solutions that do not damage the PAP machine for example given white vinegar and water rinses. Other methods such as ozone treatment may not be as good as these simple methods to achieve cleaning.   3. Obesity (BMI 30-39.9) Obesity Counseling: Had a lengthy discussion regarding patients BMI and weight issues. Patient was instructed on portion control as well as increased activity. Also discussed caloric restrictions with trying to maintain intake less than 2000 Kcal. Discussions were made in accordance with the 5As of weight management. Simple actions such as not eating late and if able to, taking a walk is suggested.   4. Benign essential hypertension Hypertension Counseling:   The following hypertensive lifestyle modification were recommended and discussed:  1. Limiting alcohol intake to less than 1 oz/day of ethanol:(24 oz of beer or 8 oz of wine or 2 oz of 100-proof whiskey). 2. Take baby ASA 81 mg daily. 3. Importance of regular aerobic exercise and losing weight. 4. Reduce dietary saturated fat and cholesterol intake for overall cardiovascular health. 5. Maintaining adequate dietary potassium, calcium, and magnesium intake. 6. Regular monitoring of the blood pressure. 7. Reduce sodium intake to less than 100 mmol/day (less than 2.3 gm of sodium or less than 6 gm of sodium choride)      General Counseling: I have discussed the findings of the evaluation and examination with Charles Blake.  I have also discussed any further diagnostic evaluation thatmay be needed or ordered today. Charles Blake verbalizes understanding of the findings of todays visit. We  also reviewed his medications today and discussed drug interactions and side effects including but not limited excessive drowsiness and altered mental states. We also discussed that there is always a risk not just to him but also people around him. he has been encouraged to call the office with any questions or concerns that should arise related to todays visit.  No orders of the defined types were placed in this encounter.       I have personally obtained a history, examined the patient, evaluated laboratory and imaging results, formulated the assessment and plan and placed orders. This patient was seen today by Tressie Ellis, PA-C in collaboration with Dr. Devona Konig.   Allyne Gee, MD [facility] Diplomate ABMS Pulmonary Critical Care Medicine and Sleep Medicine

## 2022-08-04 ENCOUNTER — Ambulatory Visit (INDEPENDENT_AMBULATORY_CARE_PROVIDER_SITE_OTHER): Payer: Medicare Other | Admitting: Internal Medicine

## 2022-08-04 VITALS — BP 149/78 | HR 68 | Resp 16 | Ht 67.0 in | Wt 231.4 lb

## 2022-08-04 DIAGNOSIS — I6523 Occlusion and stenosis of bilateral carotid arteries: Secondary | ICD-10-CM

## 2022-08-04 DIAGNOSIS — E669 Obesity, unspecified: Secondary | ICD-10-CM

## 2022-08-04 DIAGNOSIS — G4733 Obstructive sleep apnea (adult) (pediatric): Secondary | ICD-10-CM

## 2022-08-04 DIAGNOSIS — I10 Essential (primary) hypertension: Secondary | ICD-10-CM | POA: Diagnosis not present

## 2022-08-04 DIAGNOSIS — Z7189 Other specified counseling: Secondary | ICD-10-CM

## 2022-08-04 DIAGNOSIS — Z9989 Dependence on other enabling machines and devices: Secondary | ICD-10-CM

## 2022-08-04 NOTE — Patient Instructions (Signed)

## 2022-08-23 ENCOUNTER — Other Ambulatory Visit (INDEPENDENT_AMBULATORY_CARE_PROVIDER_SITE_OTHER): Payer: Self-pay | Admitting: Vascular Surgery

## 2022-10-06 ENCOUNTER — Encounter (INDEPENDENT_AMBULATORY_CARE_PROVIDER_SITE_OTHER): Payer: Self-pay

## 2022-11-17 ENCOUNTER — Ambulatory Visit (INDEPENDENT_AMBULATORY_CARE_PROVIDER_SITE_OTHER): Payer: Medicare Other | Admitting: Internal Medicine

## 2022-11-17 VITALS — BP 140/82 | HR 70 | Resp 16 | Ht 67.0 in | Wt 226.0 lb

## 2022-11-17 DIAGNOSIS — I6523 Occlusion and stenosis of bilateral carotid arteries: Secondary | ICD-10-CM

## 2022-11-17 DIAGNOSIS — I10 Essential (primary) hypertension: Secondary | ICD-10-CM

## 2022-11-17 DIAGNOSIS — Z7189 Other specified counseling: Secondary | ICD-10-CM

## 2022-11-17 DIAGNOSIS — E669 Obesity, unspecified: Secondary | ICD-10-CM

## 2022-11-17 DIAGNOSIS — G4733 Obstructive sleep apnea (adult) (pediatric): Secondary | ICD-10-CM

## 2022-11-17 NOTE — Progress Notes (Signed)
[facility] [place], [place] [address]  Pulmonary Sleep Medicine   Office Visit Note  Patient Name: Charles Blake DOB: 29-Jul-1956 MRN [id number]    Chief Complaint: Obstructive Sleep Apnea visit  Brief History:  Anterio is seen today for follow up 2 months after replacement setup on CPAP at 18 cmh20.  The patient has a 7 year history of sleep apnea. Patient is using PAP nightly.  The patient feels rested after sleeping with PAP.  The patient reports benefiting from PAP use. Reported sleepiness is  improved and the Epworth Sleepiness Score is 1 out of 24. The patient does take naps, about 2-3 times per week for about an hour. The patient complains of the following: Mask leak, he has a mask fitting scheduled. He wakes up after a few hours of sleep and has a hard time falling back asleep. The compliance download shows 100% compliance with an average use time of 10 hours 13 minutes. The AHI is 1.5.  The patient does not complain of limb movements disrupting sleep.  ROS  General: (-) fever, (-) chills, (-) night sweat Nose and Sinuses: (-) nasal stuffiness or itchiness, (-) postnasal drip, (-) nosebleeds, (-) sinus trouble. Mouth and Throat: (-) sore throat, (-) hoarseness. Neck: (-) swollen glands, (-) enlarged thyroid, (-) neck pain. Respiratory: - cough, - shortness of breath, - wheezing. Neurologic: - numbness, - tingling. Psychiatric: + anxiety, + depression   Current Medication: Outpatient Encounter Medications as of 11/17/2022  Medication Sig   acetaminophen (TYLENOL) 650 MG CR tablet Take 650 mg by mouth every 8 (eight) hours as needed for pain.   albuterol (PROVENTIL HFA;VENTOLIN HFA) 108 (90 Base) MCG/ACT inhaler Inhale 2 puffs into the lungs every 4 (four) hours as needed for wheezing.   amLODipine (NORVASC) 5 MG tablet Take 5 mg by mouth daily.   carvedilol (COREG) 6.25 MG tablet Take 6.25 mg by mouth 2 (two) times daily with a meal.    clopidogrel (PLAVIX) 75 MG tablet Take 1 tablet by mouth once daily   escitalopram (LEXAPRO) 10 MG tablet Take 10 mg by mouth in the morning.   furosemide (LASIX) 20 MG tablet Take 20 mg by mouth daily.   ketoconazole (NIZORAL) 2 % shampoo as needed.   lovastatin (MEVACOR) 40 MG tablet Take 40 mg by mouth at bedtime.   Melatonin 3 MG CAPS Take 3 mg by mouth at bedtime.   metFORMIN (GLUCOPHAGE) 500 MG tablet Take 500 mg by mouth 2 (two) times daily with a meal.   Multiple Vitamin (MULTI-VITAMINS) TABS Take 1 tablet by mouth daily.   niacin (SLO-NIACIN) 500 MG tablet Take 500 mg by mouth at bedtime.   nitroGLYCERIN (NITROSTAT) 0.4 MG SL tablet Place 1 tablet (0.4 mg total) under the tongue every 5 (five) minutes as needed for chest pain.   pantoprazole (PROTONIX) 40 MG tablet Take 40 mg by mouth daily.   umeclidinium-vilanterol (ANORO ELLIPTA) 62.5-25 MCG/ACT AEPB Inhale 1 puff into the lungs daily.   valsartan (DIOVAN) 160 MG tablet Take 160 mg by mouth 2 (two) times daily.   No facility-administered encounter medications on file as of 11/17/2022.    Surgical History: Past Surgical History:  Procedure Laterality Date   APPENDECTOMY     CARDIAC CATHETERIZATION     CAROTID ENDARTERECTOMY Left    CAROTID PTA/STENT INTERVENTION Left 08/20/2020   Procedure: CAROTID PTA/STENT INTERVENTION;  Surgeon: Algernon Huxley, MD;  Location: [place] [facility];  Service: Cardiovascular;  Laterality: Left;   CATARACT EXTRACTION Left    CATARACT EXTRACTION W/PHACO Right 05/24/2018   Procedure: CATARACT EXTRACTION PHACO AND INTRAOCULAR LENS PLACEMENT ([place]) RIGHT;  Surgeon: Eulogio Bear, MD;  Location: [place];  Service: Ophthalmology;  Laterality: Right;  CPAP, sleep apnea   COLONOSCOPY N/A 05/29/2015   Procedure: COLONOSCOPY;  Surgeon: Lollie Sails, MD;  Location: [facility];  Service: Endoscopy;  Laterality: N/A;   iliac stents     LEFT HEART CATH AND CORONARY ANGIOGRAPHY N/A  12/03/2021   Procedure: LEFT HEART CATH AND CORONARY ANGIOGRAPHY and possible PCI and stent;  Surgeon: Corey Skains, MD;  Location: [place] [facility];  Service: Cardiovascular;  Laterality: N/A;   TONSILLECTOMY      Medical History: Past Medical History:  Diagnosis Date   Anemia    Anxiety    Arthritis    fingers and hands   Cancer (HCC)    skin cancer   COPD (chronic obstructive pulmonary disease) (HCC)    wheezing   Coronary artery disease    Dyspnea    GERD (gastroesophageal reflux disease)    Headache    2-3 times/ week   History of hiatal hernia    HOH (hard of hearing)    Hypercholesteremia    Hypertension    controlled   Pneumonia    Sleep apnea    CPAP   Wears dentures    upper    Family History: Non contributory to the present illness  Social History: Social History   Socioeconomic History   Marital status: Married    Spouse name: Bretta Bang    Number of children: 1   Years of education: Not on file   Highest education level: Not on file  Occupational History   Occupation: retired   Tobacco Use   Smoking status: Former    Packs/day: 0.50    Years: 40.00    Total pack years: 20.00    Types: Cigarettes    Quit date: 11/29/2021    Years since quitting: 0.9   Smokeless tobacco: Former  Scientific laboratory technician Use: Never used  Substance and Sexual Activity   Alcohol use: Yes    Comment: rarely   Drug use: Never   Sexual activity: Not on file  Other Topics Concern   Not on file  Social History Narrative   Lives at home with wife.    Social Determinants of Health   Financial Resource Strain: Not on file  Food Insecurity: Not on file  Transportation Needs: Not on file  Physical Activity: Not on file  Stress: Not on file  Social Connections: Not on file  Intimate Partner Violence: Not on file    Vital Signs: Blood pressure (!) 140/82, pulse 70, resp. rate 16, height '5\' 7"'$  (1.702 m), weight 226 lb (102.5 kg), SpO2 95 %. Body  mass index is 35.4 kg/m.    Examination: General Appearance: The patient is well-developed, well-nourished, and in no distress. Neck Circumference: 53 cm Skin: Gross inspection of skin unremarkable. Head: normocephalic, no gross deformities. Eyes: no gross deformities noted. ENT: ears appear grossly normal Neurologic: Alert and oriented. No involuntary movements.  STOP BANG RISK ASSESSMENT S (snore) Have you been told that you snore?     NO   T (tired) Are you often tired, fatigued, or sleepy during the day?   NO  O (obstruction) Do you stop breathing, choke, or gasp during sleep? NO   P (pressure) Do  you have or are you being treated for high blood pressure? YES   B (BMI) Is your body index greater than 35 kg/m? NO   A (age) Are you 31 years old or older? YES   N (neck) Do you have a neck circumference greater than 16 inches?   YES   G (gender) Are you a male? YES   TOTAL STOP/BANG "YES" ANSWERS 4       A STOP-Bang score of 2 or less is considered low risk, and a score of 5 or more is high risk for having either moderate or severe OSA. For people who score 3 or 4, doctors may need to perform further assessment to determine how likely they are to have OSA.         EPWORTH SLEEPINESS SCALE:  Scale:  (0)= no chance of dozing; (1)= slight chance of dozing; (2)= moderate chance of dozing; (3)= high chance of dozing  Chance  Situtation    Sitting and reading: 0    Watching TV: 0    Sitting Inactive in public: 0    As a passenger in car: 0      Lying down to rest: 1    Sitting and talking: 0    Sitting quielty after lunch: 0    In a car, stopped in traffic: 0   TOTAL SCORE:   1 out of 24    SLEEP STUDIES:  PSG (07/21/16)  AHI 13.4, supine AHI 22, REM AHI 64, min SPO2 81%   CPAP COMPLIANCE DATA:  Date Range: 10/14/22 - 11/12/22  Average Daily Use: 10 hours 13 minutes  Median Use: 10 hours 25 minutes  Compliance for > 4 Hours: 30 days  AHI: 1.5  respiratory events per hour  Days Used: 30/30  Mask Leak: 95.6  95th Percentile Pressure: 18 cmh20         LABS: No results found for this or any previous visit (from the past 2160 hour(s)).  Radiology: CARDIAC CATHETERIZATION  Result Date: 12/03/2021   Prox RCA lesion is 90% stenosed.   Prox Cx lesion is 75% stenosed.   Mid LM lesion is 85% stenosed.   Ost LAD lesion is 60% stenosed.   The left ventricular systolic function is normal.   LV end diastolic pressure is normal.   The left ventricular ejection fraction is greater than 65% by visual estimate. 66 year old male with hypertension hyper kalemia family history of cardiovascular disease and intolerance to medical management for lipidemia having a non-ST elevation myocardial infarction Normal LV function with ejection fraction of 65% Critical distal left main coronary atherosclerosis of 85% with a critical stenosis of proximal right coronary artery and proximal circumflex artery After discussion with the cardiology team decision for bypass surgery was confirmed Plan Transfer for coronary artery bypass grafting due to severe left main coronary atherosclerosis and 2 other vessel coronary artery disease High intensity cholesterol therapy Beta-blocker for hypertension control Nitrates for chest pain Continue heparin intravenously    No results found.  No results found.    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 12/07/2021   S/P CABG x 3 12/07/2021   Current smoker 12/03/2021   NSTEMI (non-ST elevated myocardial infarction) ([place]) 11/29/2021   OSA on CPAP  08/05/2021   CPAP use counseling 08/05/2021   Obesity (BMI 30-39.9) 08/05/2021   COPD (chronic obstructive  pulmonary disease) ([place]) 11/05/2020   Severe tobacco use disorder 11/05/2020   Carotid stenosis, left 08/20/2020   Hyperlipidemia 07/06/2018   Hypertension 07/07/2017   Aortic atherosclerosis ([place]) 07/07/2017   Carotid artery stenosis 07/07/2017   PAD (peripheral artery disease) ([place]) 07/07/2017   Benign essential hypertension 06/14/2015   Coronary artery disease 02/06/2015   Complete left bundle branch block 01/19/2015   Psoriasis 01/10/2015   OSA (obstructive sleep apnea) 12/19/2014   Anxiety 07/06/2014   GERD (gastroesophageal reflux disease) 07/06/2014   Fracture of metatarsal of left foot, open 12/06/2013   Peripheral artery disease ([place]) 12/08/2012   1. OSA on CPAP The patient does tolerate PAP and reports  benefit from PAP use. The patient was reminded how to clean equipment and advised to replace supplies routinely. He has mask leak, and is set up for mask fitting. The patient was also counselled on weight loss. The compliance is excellent. The AHI is 1.5.   OSA on cpap- controlled. Continue with excellent compliance with pap. CPAP continues to be medically necessary to treat this patient's OSA. F/u one year.    2. CPAP use counseling CPAP Counseling: had a lengthy discussion with the patient regarding the importance of PAP therapy in management of the sleep apnea. Patient appears to understand the risk factor reduction and also understands the risks associated with untreated sleep apnea. Patient will try to make a good faith effort to remain compliant with therapy. Also instructed the patient on proper cleaning of the device including the water must be changed daily if possible and use of distilled water is preferred. Patient understands that the machine should be regularly cleaned with appropriate recommended cleaning solutions that do not damage the PAP machine for example given white vinegar and water rinses. Other methods such as ozone treatment may not be as good as these  simple methods to achieve cleaning.   3. Obesity (BMI 30-39.9) Obesity Counseling: Had a lengthy discussion regarding patients BMI and weight issues. Patient was instructed on portion control as well as increased activity. Also discussed caloric restrictions with trying to maintain intake less than 2000 Kcal. Discussions were made in accordance with the 5As of weight management. Simple actions such as not eating late and if able to, taking a walk is suggested.   4. Benign essential hypertension Hypertension Counseling:   The following hypertensive lifestyle modification were recommended and discussed:  1. Limiting alcohol intake to less than 1 oz/day of ethanol:(24 oz of beer or 8 oz of wine or 2 oz of 100-proof whiskey). 2. Take baby ASA 81 mg daily. 3. Importance of regular aerobic exercise and losing weight. 4. Reduce dietary saturated fat and cholesterol intake for overall cardiovascular health. 5. Maintaining adequate dietary potassium, calcium, and magnesium intake. 6. Regular monitoring of the blood pressure. 7. Reduce sodium intake to less than 100 mmol/day (less than 2.3 gm of sodium or less than 6 gm of sodium choride)       General Counseling: I have discussed the findings of the evaluation and examination with Jori Moll.  I have also discussed any further diagnostic evaluation thatmay be needed or ordered today. Wyett verbalizes understanding of the findings of todays visit. We also reviewed his medications today and discussed drug interactions and side effects including but not limited excessive drowsiness and altered mental states. We also discussed that there is always a risk not  just to him but also people around him. he has been encouraged to call the office with any questions or concerns that should arise related to todays visit.  No orders of the defined types were placed in this encounter.       I have personally obtained a history, examined the patient, evaluated  laboratory and imaging results, formulated the assessment and plan and placed orders.  Allyne Gee, MD [facility] Diplomate ABMS Pulmonary Critical Care Medicine and Sleep Medicine

## 2022-11-17 NOTE — Patient Instructions (Signed)

## 2023-04-22 ENCOUNTER — Other Ambulatory Visit: Payer: Self-pay

## 2023-04-22 DIAGNOSIS — Z87891 Personal history of nicotine dependence: Secondary | ICD-10-CM

## 2023-04-22 DIAGNOSIS — Z122 Encounter for screening for malignant neoplasm of respiratory organs: Secondary | ICD-10-CM

## 2023-05-13 ENCOUNTER — Ambulatory Visit
Admission: RE | Admit: 2023-05-13 | Discharge: 2023-05-13 | Disposition: A | Discharge status: Home / Self Care | Payer: Medicare Other | Source: Ambulatory Visit | Attending: Family Medicine | Admitting: Family Medicine

## 2023-05-13 DIAGNOSIS — Z122 Encounter for screening for malignant neoplasm of respiratory organs: Secondary | ICD-10-CM | POA: Insufficient documentation

## 2023-05-13 DIAGNOSIS — Z87891 Personal history of nicotine dependence: Secondary | ICD-10-CM | POA: Diagnosis present

## 2023-05-19 ENCOUNTER — Other Ambulatory Visit: Payer: Self-pay | Admitting: Acute Care

## 2023-05-19 DIAGNOSIS — Z122 Encounter for screening for malignant neoplasm of respiratory organs: Secondary | ICD-10-CM

## 2023-05-19 DIAGNOSIS — Z87891 Personal history of nicotine dependence: Secondary | ICD-10-CM

## 2023-07-21 ENCOUNTER — Ambulatory Visit (INDEPENDENT_AMBULATORY_CARE_PROVIDER_SITE_OTHER): Payer: Medicare Other

## 2023-07-21 ENCOUNTER — Encounter (INDEPENDENT_AMBULATORY_CARE_PROVIDER_SITE_OTHER): Payer: Self-pay | Admitting: Vascular Surgery

## 2023-07-21 ENCOUNTER — Ambulatory Visit (INDEPENDENT_AMBULATORY_CARE_PROVIDER_SITE_OTHER): Payer: Medicare Other | Admitting: Vascular Surgery

## 2023-07-21 VITALS — BP 162/79 | HR 61 | Resp 16 | Wt 247.0 lb

## 2023-07-21 DIAGNOSIS — I700 Atherosclerosis of aorta: Secondary | ICD-10-CM

## 2023-07-21 DIAGNOSIS — I6522 Occlusion and stenosis of left carotid artery: Secondary | ICD-10-CM

## 2023-07-21 DIAGNOSIS — E785 Hyperlipidemia, unspecified: Secondary | ICD-10-CM | POA: Diagnosis not present

## 2023-07-21 DIAGNOSIS — I739 Peripheral vascular disease, unspecified: Secondary | ICD-10-CM

## 2023-07-21 DIAGNOSIS — I10 Essential (primary) hypertension: Secondary | ICD-10-CM | POA: Diagnosis not present

## 2023-07-21 DIAGNOSIS — I6523 Occlusion and stenosis of bilateral carotid arteries: Secondary | ICD-10-CM | POA: Diagnosis not present

## 2023-07-21 NOTE — Assessment & Plan Note (Signed)
ABIs remain normal and aortoiliac duplex did not show any recurrent stenosis.  No worrisome Symptoms at current.  Continue Plavix and statin agent.  Recheck in 1 year.

## 2023-07-21 NOTE — Progress Notes (Signed)
MRN : [id number]  Charles Blake is a 67 y.o. (1956-06-19) male who presents with chief complaint of  Chief Complaint  Patient presents with   Follow-up    Ultrasound follow up  .  History of Present Illness: Patient returns in follow-up of multiple vascular issues.  He is doing well today.  He has had previous intervention for aortoiliac disease with lifestyle limiting claudication.  This resulted in near complete resolution of his claudication symptoms with no critical limb scheming symptoms. ABIs remain normal and aortoiliac duplex did not show any recurrent stenosis. He is also followed for carotid disease.  He had a carotid stent placed about 3 years ago for recurrent left carotid stenosis.  No recent focal neurologic symptoms. Specifically, the patient denies amaurosis fugax, speech or swallowing difficulties, or arm or leg weakness or numbness. Duplex shows stable 1 to 39% right ICA stenosis and a widely patent left carotid stent with no recurrent stenosis.  Current Outpatient Medications  Medication Sig Dispense Refill   acetaminophen (TYLENOL) 650 MG CR tablet Take 650 mg by mouth every 8 (eight) hours as needed for pain.     albuterol (PROVENTIL HFA;VENTOLIN HFA) 108 (90 Base) MCG/ACT inhaler Inhale 2 puffs into the lungs every 4 (four) hours as needed for wheezing. 1 Inhaler 0   amLODipine (NORVASC) 5 MG tablet Take 5 mg by mouth daily.     carvedilol (COREG) 6.25 MG tablet Take 6.25 mg by mouth 2 (two) times daily with a meal.     clopidogrel (PLAVIX) 75 MG tablet Take 1 tablet by mouth once daily 90 tablet 3   escitalopram (LEXAPRO) 10 MG tablet Take 10 mg by mouth in the morning.     furosemide (LASIX) 20 MG tablet Take 20 mg by mouth daily.     ketoconazole (NIZORAL) 2 % shampoo as needed.     lovastatin (MEVACOR) 40 MG tablet Take 40 mg by mouth at bedtime.     Melatonin 3 MG CAPS Take 3 mg by mouth at bedtime.     metFORMIN (GLUCOPHAGE) 500 MG tablet Take 500 mg by  mouth 2 (two) times daily with a meal.     Multiple Vitamin (MULTI-VITAMINS) TABS Take 1 tablet by mouth daily.     niacin (SLO-NIACIN) 500 MG tablet Take 500 mg by mouth at bedtime.     nitroGLYCERIN (NITROSTAT) 0.4 MG SL tablet Place 1 tablet (0.4 mg total) under the tongue every 5 (five) minutes as needed for chest pain.     pantoprazole (PROTONIX) 40 MG tablet Take 40 mg by mouth daily.     umeclidinium-vilanterol (ANORO ELLIPTA) 62.5-25 MCG/ACT AEPB Inhale 1 puff into the lungs daily.     valsartan (DIOVAN) 160 MG tablet Take 160 mg by mouth 2 (two) times daily.     No current facility-administered medications for this visit.    Past Medical History:  Diagnosis Date   Anemia    Anxiety    Arthritis    fingers and hands   Cancer (HCC)    skin cancer   COPD (chronic obstructive pulmonary disease) (HCC)    wheezing   Coronary artery disease    Dyspnea    GERD (gastroesophageal reflux disease)    Headache    2-3 times/ week   History of hiatal hernia    HOH (hard of hearing)    Hypercholesteremia    Hypertension    controlled   Pneumonia    Sleep apnea  CPAP   Wears dentures    upper    Past Surgical History:  Procedure Laterality Date   APPENDECTOMY     CARDIAC CATHETERIZATION     CAROTID ENDARTERECTOMY Left    CAROTID PTA/STENT INTERVENTION Left 08/20/2020   Procedure: CAROTID PTA/STENT INTERVENTION;  Surgeon: Annice Needy, MD;  Location: [facility];  Service: Cardiovascular;  Laterality: Left;   CATARACT EXTRACTION Left    CATARACT EXTRACTION W/PHACO Right 05/24/2018   Procedure: CATARACT EXTRACTION PHACO AND INTRAOCULAR LENS PLACEMENT (IOC) RIGHT;  Surgeon: Nevada Crane, MD;  Location: [facility];  Service: Ophthalmology;  Laterality: Right;  CPAP, sleep apnea   COLONOSCOPY N/A 05/29/2015   Procedure: COLONOSCOPY;  Surgeon: Christena Deem, MD;  Location: [facility];  Service: Endoscopy;  Laterality: N/A;   iliac stents     LEFT  HEART CATH AND CORONARY ANGIOGRAPHY N/A 12/03/2021   Procedure: LEFT HEART CATH AND CORONARY ANGIOGRAPHY and possible PCI and stent;  Surgeon: Lamar Blinks, MD;  Location: [facility];  Service: Cardiovascular;  Laterality: N/A;   TONSILLECTOMY       Social History   Tobacco Use   Smoking status: Former    Current packs/day: 0.00    Average packs/day: 0.5 packs/day for 40.0 years (20.0 ttl pk-yrs)    Types: Cigarettes    Start date: 11/29/1981    Quit date: 11/29/2021    Years since quitting: 1.6   Smokeless tobacco: Former  Building services engineer status: Never Used  Substance Use Topics   Alcohol use: Yes    Comment: rarely   Drug use: Never       Family History  Problem Relation Age of Onset   Heart attack Mother    Hypertension Mother    Kidney disease Mother    Heart attack Father    Hypertension Father    Colon cancer Father    Heart attack Brother      Allergies  Allergen Reactions   Rosuvastatin     Other reaction(s): Other (See Comments) Extreme joint pain & fatigue     REVIEW OF SYSTEMS (Negative unless checked)   Constitutional: [] Weight loss  [] Fever  [] Chills Cardiac: [] Chest pain   [] Chest pressure   [] Palpitations   [] Shortness of breath when laying flat   [] Shortness of breath at rest   [] Shortness of breath with exertion. Vascular:  [x] Pain in legs with walking   [] Pain in legs at rest   [] Pain in legs when laying flat   [] Claudication   [] Pain in feet when walking  [] Pain in feet at rest  [] Pain in feet when laying flat   [] History of DVT   [] Phlebitis   [] Swelling in legs   [] Varicose veins   [] Non-healing ulcers Pulmonary:   [] Uses home oxygen   [] Productive cough   [] Hemoptysis   [] Wheeze  [] COPD   [] Asthma Neurologic:  [] Dizziness  [] Blackouts   [] Seizures   [] History of stroke   [] History of TIA  [] Aphasia   [] Temporary blindness   [] Dysphagia   [] Weakness or numbness in arms   [] Weakness or numbness in legs Musculoskeletal:   [x] Arthritis   [] Joint swelling   [] Joint pain   [] Low back pain Hematologic:  [] Easy bruising  [] Easy bleeding   [] Hypercoagulable state   [] Anemic  [] Hepatitis Gastrointestinal:  [] Blood in stool   [] Vomiting blood  [x] Gastroesophageal reflux/heartburn   [] Difficulty swallowing. Genitourinary:  [] Chronic kidney disease   [] Difficult urination  []   Frequent urination  [] Burning with urination   [] Blood in urine Skin:  [] Rashes   [] Ulcers   [] Wounds Psychological:  [] History of anxiety   []  History of major depression.  Physical Examination  Vitals:   07/21/23 0903  BP: (!) 162/79  Pulse: 61  Resp: 16  Weight: 247 lb (112 kg)   Body mass index is 38.69 kg/m. Gen:  WD/WN, NAD Head: [place]/AT, No temporalis wasting. Ear/Nose/Throat: Hearing grossly intact, nares w/o erythema or drainage, trachea midline Eyes: Conjunctiva clear. Sclera non-icteric Neck: Supple.  No bruit  Pulmonary:  Good air movement, equal and clear to auscultation bilaterally.  Cardiac: RRR, No JVD Vascular:  Vessel Right Left  Radial Palpable Palpable                          PT Palpable Palpable  DP Palpable Palpable   Gastrointestinal: soft, non-tender/non-distended. No guarding/reflex.  Musculoskeletal: M/S 5/5 throughout.  No deformity or atrophy. No edema. Neurologic: CN 2-12 intact. Sensation grossly intact in extremities.  Symmetrical.  Speech is fluent. Motor exam as listed above. Psychiatric: Judgment intact, Mood & affect appropriate for pt's clinical situation. Dermatologic: No rashes or ulcers noted.  No cellulitis or open wounds.     CBC Lab Results  Component Value Date   WBC 9.5 12/03/2021   HGB 13.8 12/03/2021   HCT 39.2 12/03/2021   MCV 92.0 12/03/2021   PLT 204 12/03/2021    BMET    Component Value Date/Time   NA 137 12/03/2021 0416   NA 139 03/02/2014 0430   K 3.8 12/03/2021 0416   K 3.9 03/02/2014 0430   CL 105 12/03/2021 0416   CL 106 03/02/2014 0430   CO2 24 12/03/2021  0416   CO2 29 03/02/2014 0430   GLUCOSE 125 (H) 12/03/2021 0416   GLUCOSE 123 (H) 03/02/2014 0430   BUN 20 12/03/2021 0416   BUN 13 03/02/2014 0430   CREATININE 0.99 12/03/2021 0416   CREATININE 0.96 03/02/2014 0430   CALCIUM 8.6 (L) 12/03/2021 0416   CALCIUM 7.6 (L) 03/02/2014 0430   GFRNONAA >60 12/03/2021 0416   GFRNONAA >60 03/02/2014 0430   GFRAA >60 08/21/2020 0427   GFRAA >60 03/02/2014 0430   CrCl cannot be calculated (Patient's most recent lab result is older than the maximum 21 days allowed.).  COAG Lab Results  Component Value Date   INR 1.0 11/29/2021   INR 1.1 03/02/2014    Radiology No results found.   Assessment/Plan PAD (peripheral artery disease) (HCC) ABIs remain normal and aortoiliac duplex did not show any recurrent stenosis.  No worrisome Symptoms at current.  Continue Plavix and statin agent.  Recheck in 1 year.  Carotid artery stenosis Duplex shows stable 1 to 39% right ICA stenosis and a widely patent left carotid stent with no recurrent stenosis.  Continue Plavix and statin agent.  Follow-up in 1 year.  Hypertension blood pressure control important in reducing the progression of atherosclerotic disease. On appropriate oral medications.     Aortic atherosclerosis (HCC) Status post aortoiliac intervention several years ago and doing well.   Hyperlipidemia lipid control important in reducing the progression of atherosclerotic disease. Continue statin therapy  Festus Barren, MD  07/21/2023 12:51 PM    This note was created with Dragon medical transcription system.  Any errors from dictation are purely unintentional

## 2023-07-21 NOTE — Assessment & Plan Note (Signed)
Duplex shows stable 1 to 39% right ICA stenosis and a widely patent left carotid stent with no recurrent stenosis.  Continue Plavix and statin agent.  Follow-up in 1 year.

## 2023-08-01 ENCOUNTER — Other Ambulatory Visit (INDEPENDENT_AMBULATORY_CARE_PROVIDER_SITE_OTHER): Payer: Self-pay | Admitting: Vascular Surgery

## 2023-10-29 ENCOUNTER — Other Ambulatory Visit (INDEPENDENT_AMBULATORY_CARE_PROVIDER_SITE_OTHER): Payer: Self-pay | Admitting: Vascular Surgery

## 2023-11-13 NOTE — Progress Notes (Signed)
[facility] [address] [place], [place] [address]  Pulmonary Sleep Medicine   Office Visit Note  Patient Name: Charles Blake DOB: 04/28/1956 MRN [id number]    Chief Complaint: Obstructive Sleep Apnea visit  Brief History:  Veryl is seen today for an annual follow up visit for CPAP@ 18 cmH2O. The patient has a 8 year history of sleep apnea. Patient is using PAP nightly.  The patient feels rested after sleeping with PAP.  The patient reports benefiting from PAP use. Reported sleepiness is  improved and the Epworth Sleepiness Score is 1 out of 24. The patient will occasionally take naps. The patient complains of the following: flatulence and bloating of the stomach.  The compliance download shows 100% compliance with an average use time of 10 hours 19 minutes. The AHI is 1.5.  The patient does not complain of limb movements disrupting sleep. The patient continues to require PAP therapy in order to eliminate sleep apnea.   ROS  General: (-) fever, (-) chills, (-) night sweat Nose and Sinuses: (-) nasal stuffiness or itchiness, (-) postnasal drip, (-) nosebleeds, (-) sinus trouble. Mouth and Throat: (-) sore throat, (-) hoarseness. Neck: (-) swollen glands, (-) enlarged thyroid, (-) neck pain. Respiratory: + cough, + shortness of breath, + wheezing. Neurologic: - numbness, - tingling. Psychiatric: - anxiety, - depression   Current Medication: Outpatient Encounter Medications as of 11/16/2023  Medication Sig   [DISCONTINUED] BREZTRI AEROSPHERE 160-9-4.8 MCG/ACT AERO Inhale into the lungs.   acetaminophen (TYLENOL) 650 MG CR tablet Take 650 mg by mouth every 8 (eight) hours as needed for pain.   albuterol (PROVENTIL HFA;VENTOLIN HFA) 108 (90 Base) MCG/ACT inhaler Inhale 2 puffs into the lungs every 4 (four) hours as needed for wheezing.   amLODipine (NORVASC) 5 MG tablet Take 5 mg by mouth daily.   carvedilol (COREG) 6.25 MG tablet Take 6.25 mg by mouth 2 (two) times  daily with a meal.   clopidogrel (PLAVIX) 75 MG tablet Take 1 tablet by mouth once daily   escitalopram (LEXAPRO) 10 MG tablet Take 10 mg by mouth in the morning.   ezetimibe (ZETIA) 10 MG tablet SMARTSIG:1.0 Tablet(s) By Mouth Daily   furosemide (LASIX) 20 MG tablet Take 20 mg by mouth daily.   isosorbide mononitrate (IMDUR) 30 MG 24 hr tablet SMARTSIG:1.0 Tablet(s) By Mouth Daily   ketoconazole (NIZORAL) 2 % shampoo as needed.   lovastatin (MEVACOR) 40 MG tablet Take 40 mg by mouth at bedtime.   Melatonin 3 MG CAPS Take 3 mg by mouth at bedtime.   metFORMIN (GLUCOPHAGE) 500 MG tablet Take 500 mg by mouth 2 (two) times daily with a meal.   Multiple Vitamin (MULTI-VITAMINS) TABS Take 1 tablet by mouth daily.   niacin (SLO-NIACIN) 500 MG tablet Take 500 mg by mouth at bedtime.   nitroGLYCERIN (NITROSTAT) 0.4 MG SL tablet Place 1 tablet (0.4 mg total) under the tongue every 5 (five) minutes as needed for chest pain.   pantoprazole (PROTONIX) 40 MG tablet Take 40 mg by mouth daily.   umeclidinium-vilanterol (ANORO ELLIPTA) 62.5-25 MCG/ACT AEPB Inhale 1 puff into the lungs daily.   valsartan (DIOVAN) 160 MG tablet Take 160 mg by mouth 2 (two) times daily.   No facility-administered encounter medications on file as of 11/16/2023.    Surgical History: Past Surgical History:  Procedure Laterality Date   APPENDECTOMY     CARDIAC CATHETERIZATION     CAROTID ENDARTERECTOMY Left    CAROTID PTA/STENT INTERVENTION  Left 08/20/2020   Procedure: CAROTID PTA/STENT INTERVENTION;  Surgeon: Annice Needy, MD;  Location: [facility] INVASIVE CV LAB;  Service: Cardiovascular;  Laterality: Left;   CATARACT EXTRACTION Left    CATARACT EXTRACTION W/PHACO Right 05/24/2018   Procedure: CATARACT EXTRACTION PHACO AND INTRAOCULAR LENS PLACEMENT (IOC) RIGHT;  Surgeon: Nevada Crane, MD;  Location: [facility];  Service: Ophthalmology;  Laterality: Right;  CPAP, sleep apnea   COLONOSCOPY N/A 05/29/2015   Procedure:  COLONOSCOPY;  Surgeon: Christena Deem, MD;  Location: [facility] ENDOSCOPY;  Service: Endoscopy;  Laterality: N/A;   iliac stents     LEFT HEART CATH AND CORONARY ANGIOGRAPHY N/A 12/03/2021   Procedure: LEFT HEART CATH AND CORONARY ANGIOGRAPHY and possible PCI and stent;  Surgeon: Lamar Blinks, MD;  Location: [facility] INVASIVE CV LAB;  Service: Cardiovascular;  Laterality: N/A;   TONSILLECTOMY      Medical History: Past Medical History:  Diagnosis Date   Anemia    Anxiety    Arthritis    fingers and hands   Cancer (HCC)    skin cancer   COPD (chronic obstructive pulmonary disease) (HCC)    wheezing   Coronary artery disease    Dyspnea    GERD (gastroesophageal reflux disease)    Headache    2-3 times/ week   History of hiatal hernia    HOH (hard of hearing)    Hypercholesteremia    Hypertension    controlled   Pneumonia    Sleep apnea    CPAP   Wears dentures    upper    Family History: Non contributory to the present illness  Social History: Social History   Socioeconomic History   Marital status: Married    Spouse name: Elmo Putt    Number of children: 1   Years of education: Not on file   Highest education level: Not on file  Occupational History   Occupation: retired   Tobacco Use   Smoking status: Former    Current packs/day: 0.00    Average packs/day: 0.5 packs/day for 40.0 years (20.0 ttl pk-yrs)    Types: Cigarettes    Start date: 11/29/1981    Quit date: 11/29/2021    Years since quitting: 1.9   Smokeless tobacco: Former  Building services engineer status: Never Used  Substance and Sexual Activity   Alcohol use: Yes    Comment: rarely   Drug use: Never   Sexual activity: Not on file  Other Topics Concern   Not on file  Social History Narrative   Lives at home with wife.    Social Determinants of Health   Financial Resource Strain: Low Risk  (11/12/2023)   Received from [facility]   Overall Financial Resource Strain  (CARDIA)    Difficulty of Paying Living Expenses: Not hard at all  Food Insecurity: No Food Insecurity (11/12/2023)   Received from [facility]   Hunger Vital Sign    Worried About Running Out of Food in the Last Year: Never true    Ran Out of Food in the Last Year: Never true  Transportation Needs: No Transportation Needs (11/12/2023)   Received from [facility] - Transportation    In the past 12 months, has lack of transportation kept you from medical appointments or from getting medications?: No    Lack of Transportation (Non-Medical): No  Physical Activity: Not on file  Stress: Not on file  Social Connections: Not on file  Intimate Partner Violence: Not on file    Vital Signs: Blood pressure (!) 142/76, pulse 76, resp. rate 16, height 5\' 7"  (1.702 m), weight 249 lb (112.9 kg), SpO2 96%. Body mass index is 39 kg/m.    Examination: General Appearance: The patient is well-developed, well-nourished, and in no distress. Neck Circumference: 52 cm Skin: Gross inspection of skin unremarkable. Head: normocephalic, no gross deformities. Eyes: no gross deformities noted. ENT: ears appear grossly normal Neurologic: Alert and oriented. No involuntary movements.  STOP BANG RISK ASSESSMENT S (snore) Have you been told that you snore?     NO   T (tired) Are you often tired, fatigued, or sleepy during the day?   NO  O (obstruction) Do you stop breathing, choke, or gasp during sleep? NO   P (pressure) Do you have or are you being treated for high blood pressure? YES   B (BMI) Is your body index greater than 35 kg/m? YES   A (age) Are you 51 years old or older? YES   N (neck) Do you have a neck circumference greater than 16 inches?   YES   G (gender) Are you a male? YES   TOTAL STOP/BANG "YES" ANSWERS 5       A STOP-Bang score of 2 or less is considered low risk, and a score of 5 or more is high risk for having either moderate or  severe OSA. For people who score 3 or 4, doctors may need to perform further assessment to determine how likely they are to have OSA.         EPWORTH SLEEPINESS SCALE:  Scale:  (0)= no chance of dozing; (1)= slight chance of dozing; (2)= moderate chance of dozing; (3)= high chance of dozing  Chance  Situtation    Sitting and reading: 0    Watching TV: 0    Sitting Inactive in public: 0    As a passenger in car: 0      Lying down to rest: 1    Sitting and talking: 0    Sitting quielty after lunch: 0    In a car, stopped in traffic: 0   TOTAL SCORE:   1 out of 24    SLEEP STUDIES:  PSG (07/2016) AHI 13.4/hr, Supine AHI 22/hr, min SpO2 81% Titration (08/2016) CPAP@ 20 cmH2O   CPAP COMPLIANCE DATA:  Date Range: 11/13/2022-11/12/2023  Average Daily Use: 10 hours 20 minutes  Median Use: 10 hours 19 minutes  Compliance for > 4 Hours: 100%  AHI: 1.5 respiratory events per hour  Days Used: 365/365 days  Mask Leak: 80.5   95th Percentile Pressure: 18         LABS: No results found for this or any previous visit (from the past 2160 hour(s)).  Radiology: CT CHEST LUNG CA SCREEN LOW DOSE W/O CM  Result Date: 05/19/2023 CLINICAL DATA:  67 year old male former smoker (quit in 2022) with 52 pack-year history of smoking. Lung cancer screening examination. EXAM: CT CHEST WITHOUT CONTRAST LOW-DOSE FOR LUNG CANCER SCREENING TECHNIQUE: Multidetector CT imaging of the chest was performed following the standard protocol without IV contrast. RADIATION DOSE REDUCTION: This exam was performed according to the departmental dose-optimization program which includes automated exposure control, adjustment of the mA and/or kV according to patient size and/or use of iterative reconstruction technique. COMPARISON:  Low-dose lung cancer screening chest CT 12/11/2020. FINDINGS: Cardiovascular: Heart size is normal. There is no significant pericardial fluid, thickening  or pericardial  calcification. There is aortic atherosclerosis, as well as atherosclerosis of the great vessels of the mediastinum and the coronary arteries, including calcified atherosclerotic plaque in the left main, left anterior descending and right coronary arteries. Status post median sternotomy for CABG including LIMA to the LAD. Mediastinum/Nodes: No pathologically enlarged mediastinal or hilar lymph nodes. Please note that accurate exclusion of hilar adenopathy is limited on noncontrast CT scans. Small hiatal hernia. No axillary lymphadenopathy. Lungs/Pleura: Small pulmonary nodules are again noted, largest of which is in the posterior aspect of the left lower lobe abutting the pleural surface (axial image 221), with a volume derived mean diameter 5.2 mm. No larger more suspicious appearing pulmonary nodules or masses are noted. No acute consolidative airspace disease. No pleural effusions. Mild scarring in the periphery of the left lower lobe. Mild diffuse bronchial wall thickening with mild centrilobular and paraseptal emphysema. Upper Abdomen: Aortic atherosclerosis. Diffuse low attenuation throughout the visualized hepatic parenchyma, indicative of a background of severe hepatic steatosis. Musculoskeletal: Median sternotomy wires. There are no aggressive appearing lytic or blastic lesions noted in the visualized portions of the skeleton. IMPRESSION: 1. Lung-RADS 2, benign appearance or behavior. Continue annual screening with low-dose chest CT without contrast in 12 months. 2. There is aortic atherosclerosis, in addition to left main and three-vessel coronary artery disease. Patient is status post median sternotomy for CABG including LIMA to the LAD. 3. Mild diffuse bronchial wall thickening with mild centrilobular and paraseptal emphysema; imaging findings suggestive of underlying COPD. 4. Hepatic steatosis. Aortic Atherosclerosis (ICD10-I70.0) and Emphysema (ICD10-J43.9). Electronically Signed   By: Trudie Reed  M.D.   On: 05/19/2023 09:48   No results found.  No results found.    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 12/07/2021   S/P CABG x 3 12/07/2021   Type 2 diabetes mellitus (HCC) 12/04/2021   Current smoker 12/03/2021   NSTEMI (non-ST elevated myocardial infarction) (HCC) 11/29/2021   OSA on CPAP 08/05/2021   CPAP use counseling 08/05/2021   Obesity (BMI 30-39.9) 08/05/2021   COPD (chronic obstructive pulmonary disease) (HCC) 11/05/2020   Severe tobacco use disorder 11/05/2020   Carotid stenosis, left 08/20/2020   Hyperlipidemia 07/06/2018   Hypertension 07/07/2017   Aortic atherosclerosis (HCC) 07/07/2017   Carotid artery stenosis 07/07/2017   PAD (peripheral artery disease) (HCC) 07/07/2017   Benign essential hypertension 06/14/2015   Coronary artery disease 02/06/2015   Complete left bundle branch block 01/19/2015   Psoriasis 01/10/2015   OSA (obstructive sleep apnea) 12/19/2014   Anxiety 07/06/2014   GERD (gastroesophageal reflux disease) 07/06/2014   Fracture of metatarsal of left foot, open 12/06/2013   Peripheral artery disease (HCC) 12/08/2012   1. OSA on CPAP The patient does tolerate PAP and reports  benefit from PAP use. The patient was reminded how to clean equipment and advised to replace supplies routinely. He has high mask leak which we discussed. He may be washing his seals in too hot water, he will try backing down on that. The patient was also counselled on weight loss. The compliance is excellent. The AHI is 1.5.   OSA on cpap- controlled. Continue with excellent compliance with pap. CPAP continues to be medically necessary to treat this patient's OSA. F/u one  year.    2. CPAP use  counseling CPAP Counseling: had a lengthy discussion with the patient regarding the importance of PAP therapy in management of the sleep apnea. Patient appears to understand the risk factor reduction and also understands the risks associated with untreated sleep apnea. Patient will try to make a good faith effort to remain compliant with therapy. Also instructed the patient on proper cleaning of the device including the water must be changed daily if possible and use of distilled water is preferred. Patient understands that the machine should be regularly cleaned with appropriate recommended cleaning solutions that do not damage the PAP machine for example given white vinegar and water rinses. Other methods such as ozone treatment may not be as good as these simple methods to achieve cleaning.   3. Obesity (BMI 30-39.9) Obesity Counseling: Had a lengthy discussion regarding patients BMI and weight issues. Patient was instructed on portion control as well as increased activity. Also discussed caloric restrictions with trying to maintain intake less than 2000 Kcal. Discussions were made in accordance with the 5As of weight management. Simple actions such as not eating late and if able to, taking a walk is suggested.   4. Benign essential hypertension  Continue amlodipine, coreg. F/u with provider as scheduled.     General Counseling: I have discussed the findings of the evaluation and examination with Windy Fast.  I have also discussed any further diagnostic evaluation thatmay be needed or ordered today. Lyndol verbalizes understanding of the findings of todays visit. We also reviewed his medications today and discussed drug interactions and side effects including but not limited excessive drowsiness and altered mental states. We also discussed that there is always a risk not just to him but also people around him. he has been encouraged to call the office with any questions or concerns that should arise  related to todays visit.  No orders of the defined types were placed in this encounter.       I have personally obtained a history, examined the patient, evaluated laboratory and imaging results, formulated the assessment and plan and placed orders. This patient was seen today by Emmaline Kluver, PA-C in collaboration with Dr. Freda Munro.   Yevonne Pax, MD [facility] Diplomate ABMS Pulmonary Critical Care Medicine and Sleep Medicine

## 2023-11-16 ENCOUNTER — Ambulatory Visit (INDEPENDENT_AMBULATORY_CARE_PROVIDER_SITE_OTHER): Payer: Medicare Other | Admitting: Internal Medicine

## 2023-11-16 VITALS — BP 142/76 | HR 76 | Resp 16 | Ht 67.0 in | Wt 249.0 lb

## 2023-11-16 DIAGNOSIS — I10 Essential (primary) hypertension: Secondary | ICD-10-CM

## 2023-11-16 DIAGNOSIS — G4733 Obstructive sleep apnea (adult) (pediatric): Secondary | ICD-10-CM | POA: Diagnosis not present

## 2023-11-16 DIAGNOSIS — Z7189 Other specified counseling: Secondary | ICD-10-CM

## 2023-11-16 DIAGNOSIS — E669 Obesity, unspecified: Secondary | ICD-10-CM | POA: Diagnosis not present

## 2023-11-16 NOTE — Patient Instructions (Signed)

## 2023-12-04 ENCOUNTER — Ambulatory Visit: Admit: 2023-12-04 | Payer: Medicare Other

## 2023-12-04 SURGERY — COLONOSCOPY WITH PROPOFOL
Anesthesia: General

## 2024-04-26 ENCOUNTER — Encounter (INDEPENDENT_AMBULATORY_CARE_PROVIDER_SITE_OTHER): Payer: Self-pay

## 2024-05-13 ENCOUNTER — Ambulatory Visit

## 2024-05-17 ENCOUNTER — Ambulatory Visit
Admission: RE | Admit: 2024-05-17 | Discharge: 2024-05-17 | Disposition: A | Discharge status: Home / Self Care | Source: Ambulatory Visit | Attending: Acute Care | Admitting: Acute Care

## 2024-05-17 DIAGNOSIS — Z122 Encounter for screening for malignant neoplasm of respiratory organs: Secondary | ICD-10-CM | POA: Diagnosis not present

## 2024-05-17 DIAGNOSIS — Z87891 Personal history of nicotine dependence: Secondary | ICD-10-CM | POA: Insufficient documentation

## 2024-05-31 ENCOUNTER — Other Ambulatory Visit: Payer: Self-pay

## 2024-05-31 DIAGNOSIS — Z87891 Personal history of nicotine dependence: Secondary | ICD-10-CM

## 2024-05-31 DIAGNOSIS — Z122 Encounter for screening for malignant neoplasm of respiratory organs: Secondary | ICD-10-CM

## 2024-07-21 ENCOUNTER — Encounter (INDEPENDENT_AMBULATORY_CARE_PROVIDER_SITE_OTHER): Payer: Self-pay | Admitting: Vascular Surgery

## 2024-07-21 ENCOUNTER — Other Ambulatory Visit (INDEPENDENT_AMBULATORY_CARE_PROVIDER_SITE_OTHER): Payer: Self-pay | Admitting: Vascular Surgery

## 2024-07-21 DIAGNOSIS — I6523 Occlusion and stenosis of bilateral carotid arteries: Secondary | ICD-10-CM

## 2024-07-21 DIAGNOSIS — I739 Peripheral vascular disease, unspecified: Secondary | ICD-10-CM

## 2024-07-22 ENCOUNTER — Encounter (INDEPENDENT_AMBULATORY_CARE_PROVIDER_SITE_OTHER): Payer: Self-pay | Admitting: Vascular Surgery

## 2024-07-22 ENCOUNTER — Ambulatory Visit (INDEPENDENT_AMBULATORY_CARE_PROVIDER_SITE_OTHER): Payer: Medicare Other | Admitting: Vascular Surgery

## 2024-07-22 ENCOUNTER — Ambulatory Visit (INDEPENDENT_AMBULATORY_CARE_PROVIDER_SITE_OTHER): Payer: Medicare Other

## 2024-07-22 VITALS — BP 157/80 | HR 58 | Ht 67.0 in | Wt 228.0 lb

## 2024-07-22 DIAGNOSIS — I739 Peripheral vascular disease, unspecified: Secondary | ICD-10-CM

## 2024-07-22 DIAGNOSIS — I6523 Occlusion and stenosis of bilateral carotid arteries: Secondary | ICD-10-CM | POA: Diagnosis not present

## 2024-07-22 DIAGNOSIS — I700 Atherosclerosis of aorta: Secondary | ICD-10-CM

## 2024-07-22 DIAGNOSIS — I10 Essential (primary) hypertension: Secondary | ICD-10-CM

## 2024-07-22 DIAGNOSIS — E785 Hyperlipidemia, unspecified: Secondary | ICD-10-CM

## 2024-07-22 LAB — VAS US ABI WITH/WO TBI
Left ABI: 1.12
Right ABI: 1.12

## 2024-07-22 NOTE — Assessment & Plan Note (Signed)
 Carotid duplex today shows stable 1 to 39% right ICA stenosis and a widely patent left carotid stent.  No change in medical regimen.  Continue annual surveillance duplex for follow-up.

## 2024-07-22 NOTE — Progress Notes (Signed)
 MRN : [id number]  Charles Blake is a 68 y.o. (1956/07/03) male who presents with chief complaint of  Chief Complaint  Patient presents with   Carotid    1 year follow up   .  History of Present Illness: Patient returns today in follow up of multiple vascular issues.  He is doing well today.  He is several years status post aortoiliac intervention for short distance claudication.  He currently has no lifestyle-limiting claudication, ischemic rest pain, or ulceration. ABIs today are 1.2 bilaterally with multiphasic waveforms and good digital pressures.  Aortoiliac duplex demonstrates no hemodynamically significant stenosis in the aortoiliac arteries and no aneurysms.  His previous stents appear to be largely patent.   He is also followed for carotid disease.  He previously had a carotid endarterectomy at another institution.  I did a left carotid stent about 4 years ago for recurrent stenosis and he has done well.  No focal neurologic symptoms. Specifically, the patient denies amaurosis fugax, speech or swallowing difficulties, or arm or leg weakness or numbness.  Carotid duplex today shows stable 1 to 39% right ICA stenosis and a widely patent left carotid stent.  Current Outpatient Medications  Medication Sig Dispense Refill   acetaminophen  (TYLENOL ) 650 MG CR tablet Take 650 mg by mouth every 8 (eight) hours as needed for pain.     albuterol  (PROVENTIL  HFA;VENTOLIN  HFA) 108 (90 Base) MCG/ACT inhaler Inhale 2 puffs into the lungs every 4 (four) hours as needed for wheezing. 1 Inhaler 0   amLODipine  (NORVASC ) 5 MG tablet Take 5 mg by mouth daily.     carvedilol  (COREG ) 6.25 MG tablet Take 6.25 mg by mouth 2 (two) times daily with a meal.     clopidogrel  (PLAVIX ) 75 MG tablet Take 1 tablet by mouth once daily 90 tablet 3   escitalopram  (LEXAPRO ) 10 MG tablet Take 10 mg by mouth in the morning.     furosemide  (LASIX ) 20 MG tablet Take 20 mg by mouth daily.     isosorbide  mononitrate (IMDUR )  30 MG 24 hr tablet SMARTSIG:1.0 Tablet(s) By Mouth Daily     ketoconazole (NIZORAL) 2 % shampoo as needed.     lovastatin (MEVACOR) 40 MG tablet Take 40 mg by mouth at bedtime.     Melatonin 3 MG CAPS Take 3 mg by mouth at bedtime.     metFORMIN (GLUCOPHAGE) 500 MG tablet Take 500 mg by mouth 2 (two) times daily with a meal.     Multiple Vitamin (MULTI-VITAMINS) TABS Take 1 tablet by mouth daily.     niacin (SLO-NIACIN) 500 MG tablet Take 500 mg by mouth at bedtime.     nitroGLYCERIN  (NITROSTAT ) 0.4 MG SL tablet Place 1 tablet (0.4 mg total) under the tongue every 5 (five) minutes as needed for chest pain.     pantoprazole  (PROTONIX ) 40 MG tablet Take 40 mg by mouth daily.     umeclidinium-vilanterol (ANORO ELLIPTA ) 62.5-25 MCG/ACT AEPB Inhale 1 puff into the lungs daily.     valsartan (DIOVAN) 160 MG tablet Take 160 mg by mouth 2 (two) times daily.     ezetimibe (ZETIA) 10 MG tablet SMARTSIG:1.0 Tablet(s) By Mouth Daily (Patient not taking: Reported on 07/22/2024)     No current facility-administered medications for this visit.    Past Medical History:  Diagnosis Date   Anemia    Anxiety    Arthritis    fingers and hands   Cancer (HCC)    skin cancer  COPD (chronic obstructive pulmonary disease) (HCC)    wheezing   Coronary artery disease    Dyspnea    GERD (gastroesophageal reflux disease)    Headache    2-3 times/ week   History of hiatal hernia    HOH (hard of hearing)    Hypercholesteremia    Hypertension    controlled   Pneumonia    Sleep apnea    CPAP   Wears dentures    upper    Past Surgical History:  Procedure Laterality Date   APPENDECTOMY     CARDIAC CATHETERIZATION     CAROTID ENDARTERECTOMY Left    CAROTID PTA/STENT INTERVENTION Left 08/20/2020   Procedure: CAROTID PTA/STENT INTERVENTION;  Surgeon: Marea Selinda RAMAN, MD;  Location: [facility];  Service: Cardiovascular;  Laterality: Left;   CATARACT EXTRACTION Left    CATARACT EXTRACTION W/PHACO  Right 05/24/2018   Procedure: CATARACT EXTRACTION PHACO AND INTRAOCULAR LENS PLACEMENT (IOC) RIGHT;  Surgeon: Myrna Adine Anes, MD;  Location: [facility];  Service: Ophthalmology;  Laterality: Right;  CPAP, sleep apnea   COLONOSCOPY N/A 05/29/2015   Procedure: COLONOSCOPY;  Surgeon: Gladis RAYMOND Mariner, MD;  Location: [facility] ENDOSCOPY;  Service: Endoscopy;  Laterality: N/A;   iliac stents     LEFT HEART CATH AND CORONARY ANGIOGRAPHY N/A 12/03/2021   Procedure: LEFT HEART CATH AND CORONARY ANGIOGRAPHY and possible PCI and stent;  Surgeon: Hester Wolm PARAS, MD;  Location: [facility] INV[facility] CV [facility];  Service: Cardiovascular;  Laterality: N/A;   TONSILLECTOMY       Social History   Tobacco Use   Smoking status: Former    Current packs/day: 0.00    Average packs/day: 0.5 packs/day for 40.0 years (20.0 ttl pk-yrs)    Types: Cigarettes    Start date: 11/29/1981    Quit date: 11/29/2021    Years since quitting: 2.6   Smokeless tobacco: Former  Building services engineer status: Never Used  Substance Use Topics   Alcohol use: Yes    Comment: rarely   Drug use: Never      Family History  Problem Relation Age of Onset   Heart attack Mother    Hypertension Mother    Kidney disease Mother    Heart attack Father    Hypertension Father    Colon cancer Father    Heart attack Brother      Allergies  Allergen Reactions   Rosuvastatin     Other reaction(s): Other (See Comments) Extreme joint pain & fatigue    Breztri Aerosphere [Budeson-Glycopyrrol-Formoterol] Palpitations     REVIEW OF SYSTEMS (Negative unless checked)   Constitutional: [] Weight loss  [] Fever  [] Chills Cardiac: [] Chest pain   [] Chest pressure   [] Palpitations   [] Shortness of breath when laying flat   [] Shortness of breath at rest   [] Shortness of breath with exertion. Vascular:  [x] Pain in legs with walking   [] Pain in legs at rest   [] Pain in legs when laying flat   [] Claudication   [] Pain in feet when walking   [] Pain in feet at rest  [] Pain in feet when laying flat   [] History of DVT   [] Phlebitis   [] Swelling in legs   [] Varicose veins   [] Non-healing ulcers Pulmonary:   [] Uses home oxygen   [] Productive cough   [] Hemoptysis   [] Wheeze  [] COPD   [] Asthma Neurologic:  [] Dizziness  [] Blackouts   [] Seizures   [] History of stroke   [] History of TIA  [] Aphasia   []   Temporary blindness   [] Dysphagia   [] Weakness or numbness in arms   [] Weakness or numbness in legs Musculoskeletal:  [x] Arthritis   [] Joint swelling   [] Joint pain   [] Low back pain Hematologic:  [] Easy bruising  [] Easy bleeding   [] Hypercoagulable state   [] Anemic  [] Hepatitis Gastrointestinal:  [] Blood in stool   [] Vomiting blood  [x] Gastroesophageal reflux/heartburn   [] Difficulty swallowing. Genitourinary:  [] Chronic kidney disease   [] Difficult urination  [] Frequent urination  [] Burning with urination   [] Blood in urine Skin:  [] Rashes   [] Ulcers   [] Wounds Psychological:  [] History of anxiety   []  History of major depression.  Physical Examination  BP (!) 157/80   Pulse (!) 58   Ht 5' 7 (1.702 m)   Wt 228 lb (103.4 kg)   BMI 35.71 kg/m  Gen:  WD/WN, NAD Head: [place]/AT, No temporalis wasting. Ear/Nose/Throat: Hearing grossly intact, nares w/o erythema or drainage Eyes: Conjunctiva clear. Sclera non-icteric Neck: Supple.  Trachea midline Pulmonary:  Good air movement, no use of accessory muscles.  Cardiac: bradycardic Vascular:  Vessel Right Left  Radial Palpable Palpable                          PT Palpable Palpable  DP Palpable Palpable   Gastrointestinal: soft, non-tender/non-distended. No guarding/reflex.  Musculoskeletal: M/S 5/5 throughout.  No deformity or atrophy. No edema. Neurologic: Sensation grossly intact in extremities.  Symmetrical.  Speech is fluent.  Psychiatric: Judgment intact, Mood & affect appropriate for pt's clinical situation. Dermatologic: No rashes or ulcers noted.  No cellulitis or open  wounds.      Labs Recent Results (from the past 2160 hours)  VAS US  ABI WITH/WO TBI     Status: None   Collection Time: 07/22/24  8:25 AM  Result Value Ref Range   Right ABI 1.12    Left ABI 1.12     Radiology VAS US  AORTA/IVC/ILIACS Result Date: 07/22/2024 ABDOMINAL AORTA STUDY Patient Name:  IZAAN KINGBIRD  Date of Exam:   07/22/2024 Medical Rec #: [id number]          Accession #:    [id number] Date of Birth: [birth year]           Patient Gender: M Patient Age:   1 years Exam Location:  [facility] Procedure:      VAS US  AORTA/IVC/ILIACS Referring Phys: SELINDA GU --------------------------------------------------------------------------------  Risk Factors: Hypertension, hyperlipidemia. Vascular Interventions: 05/27/12: Bilateral common iliac artery PTA/stents;                         01/19/14: distal aorta & bilateral common iliac artery                         angioplasties.  Comparison Study: 07/2022 Performing Technologist: Jerel Croak RVT  Examination Guidelines: A complete evaluation includes B-mode imaging, spectral Doppler, color Doppler, and power Doppler as needed of all accessible portions of each vessel. Bilateral testing is considered an integral part of a complete examination. Limited examinations for reoccurring indications may be performed as noted.  Abdominal Aorta Findings: +-------------+-------+----------+----------+--------+--------+--------+ Location     AP (cm)Trans (cm)PSV (cm/s)WaveformThrombusComments +-------------+-------+----------+----------+--------+--------+--------+ Proximal     2.10   2.20      62                                 +-------------+-------+----------+----------+--------+--------+--------+  Mid          1.71   1.76      81                                 +-------------+-------+----------+----------+--------+--------+--------+ Distal       1.62   1.69      104                                 +-------------+-------+----------+----------+--------+--------+--------+ RT CIA Prox  0.9    1.0       134                                +-------------+-------+----------+----------+--------+--------+--------+ RT CIA Mid                    156                                +-------------+-------+----------+----------+--------+--------+--------+ RT CIA Distal                 202                                +-------------+-------+----------+----------+--------+--------+--------+ RT EIA Prox                   133                                +-------------+-------+----------+----------+--------+--------+--------+ RT EIA Mid                    102                                +-------------+-------+----------+----------+--------+--------+--------+ RT EIA Distal                 118                                +-------------+-------+----------+----------+--------+--------+--------+ LT CIA Prox  1.0    1.1       113                                +-------------+-------+----------+----------+--------+--------+--------+ LT CIA Mid                    80                                 +-------------+-------+----------+----------+--------+--------+--------+ LT CIA Distal                 153                                +-------------+-------+----------+----------+--------+--------+--------+ LT EIA Prox                   117                                +-------------+-------+----------+----------+--------+--------+--------+  LT EIA Mid                    137                                +-------------+-------+----------+----------+--------+--------+--------+ LT EIA Distal                 124                                +-------------+-------+----------+----------+--------+--------+--------+  Summary: Abdominal Aorta: No evidence of an abdominal aortic aneurysm was visualized. The largest aortic measurement is 2.2  cm. Evidence of patent CIA stents with mildly increased velocities in the distal CIA's bilaterally. Normal ABI's bilaterally.  *See table(s) above for measurements and observations.  Electronically signed by Selinda Gu MD on 07/22/2024 at 8:47:25 AM.    Final    VAS US  ABI WITH/WO TBI Result Date: 07/22/2024  LOWER EXTREMITY DOPPLER STUDY Patient Name:  KORBIN MAPPS  Date of Exam:   07/22/2024 Medical Rec #: [id number]          Accession #:    [id number] Date of Birth: Oct 12, 1956           Patient Gender: M Patient Age:   29 years Exam Location:  [facility] Procedure:      VAS US  ABI WITH/WO TBI Referring Phys: --------------------------------------------------------------------------------  Indications: Claudication, and peripheral artery disease. High Risk Factors: Hypertension, hyperlipidemia.  Vascular Interventions: 05/27/12: Bilateral common iliac artery PTA/stents;                         01/19/14: distal aorta & bilateral common iliac artery                         angioplasties. Performing Technologist: Jerel Croak RVT  Examination Guidelines: A complete evaluation includes at minimum, Doppler waveform signals and systolic blood pressure reading at the level of bilateral brachial, anterior tibial, and posterior tibial arteries, when vessel segments are accessible. Bilateral testing is considered an integral part of a complete examination. Photoelectric Plethysmograph (PPG) waveforms and toe systolic pressure readings are included as required and additional duplex testing as needed. Limited examinations for reoccurring indications may be performed as noted.  ABI Findings: +---------+------------------+-----+---------+--------+ Right    Rt Pressure (mmHg)IndexWaveform Comment  +---------+------------------+-----+---------+--------+ Brachial 146                                      +---------+------------------+-----+---------+--------+ PTA      163               1.12 triphasic          +---------+------------------+-----+---------+--------+ DP       160               1.10 triphasic         +---------+------------------+-----+---------+--------+ Great Toe172               1.18 Normal            +---------+------------------+-----+---------+--------+ +---------+------------------+-----+---------+-------+ Left     Lt Pressure (mmHg)IndexWaveform Comment +---------+------------------+-----+---------+-------+ Brachial 145                                     +---------+------------------+-----+---------+-------+  PTA      164               1.12 triphasic        +---------+------------------+-----+---------+-------+ DP       152               1.04 triphasic        +---------+------------------+-----+---------+-------+ Great Toe175               1.20 Normal           +---------+------------------+-----+---------+-------+ +-------+-----------+-----------+------------+------------+ ABI/TBIToday's ABIToday's TBIPrevious ABIPrevious TBI +-------+-----------+-----------+------------+------------+ Right  1.12       1.18       1.09        1.02         +-------+-----------+-----------+------------+------------+ Left   1.12       1.20       1.10        1.07         +-------+-----------+-----------+------------+------------+ Bilateral ABIs and TBIs appear essentially unchanged compared to prior study on 07/22/2022.  Summary: Right: Resting right ankle-brachial index is within normal range. The right toe-brachial index is normal. Left: Resting left ankle-brachial index is within normal range. The left toe-brachial index is normal. *See table(s) above for measurements and observations.  Electronically signed by Selinda Gu MD on 07/22/2024 at 8:47:12 AM.    Final    VAS US  CAROTID Result Date: 07/22/2024 Carotid Arterial Duplex Study Patient Name:  PORTER MOES  Date of Exam:   07/22/2024 Medical Rec #: [id number]          Accession #:    [id number]  Date of Birth: 1956/12/02           Patient Gender: M Patient Age:   5 years Exam Location:  [facility] Procedure:      VAS US  CAROTID Referring Phys: SELINDA GU --------------------------------------------------------------------------------  Risk Factors:     Hypertension, hyperlipidemia, prior MI, coronary artery                   disease. Other Factors:    Left CEA 2015                   08/20/2020 Left ICA stent. Comparison Study: 07/2023 Performing Technologist: Jerel Croak RVT  Examination Guidelines: A complete evaluation includes B-mode imaging, spectral Doppler, color Doppler, and power Doppler as needed of all accessible portions of each vessel. Bilateral testing is considered an integral part of a complete examination. Limited examinations for reoccurring indications may be performed as noted.  Right Carotid Findings: +----------+--------+--------+--------+-------------------+--------+           PSV cm/sEDV cm/sStenosisPlaque Description Comments +----------+--------+--------+--------+-------------------+--------+ CCA Prox  49      12                                          +----------+--------+--------+--------+-------------------+--------+ CCA Mid   65      15                                          +----------+--------+--------+--------+-------------------+--------+ CCA Distal78      16                                          +----------+--------+--------+--------+-------------------+--------+  ICA Prox  89      16                                          +----------+--------+--------+--------+-------------------+--------+ ICA Mid   110     27      1-39%   calcific and smooth         +----------+--------+--------+--------+-------------------+--------+ ICA Distal106     25                                          +----------+--------+--------+--------+-------------------+--------+ ECA       104     15                                           +----------+--------+--------+--------+-------------------+--------+ +----------+--------+-------+----------------+-------------------+           PSV cm/sEDV cmsDescribe        Arm Pressure (mmHG) +----------+--------+-------+----------------+-------------------+ [id number]            Multiphasic, WNL                    +----------+--------+-------+----------------+-------------------+ +---------+--------+--+--------+-+---------+ VertebralPSV cm/s43EDV cm/s9Antegrade +---------+--------+--+--------+-+---------+  Left Carotid Findings: +----------+--------+--------+--------+------------------+--------+           PSV cm/sEDV cm/sStenosisPlaque DescriptionComments +----------+--------+--------+--------+------------------+--------+ CCA Prox  68      18                                         +----------+--------+--------+--------+------------------+--------+ CCA Mid   66      20                                stent    +----------+--------+--------+--------+------------------+--------+ CCA Distal55      13                                stent    +----------+--------+--------+--------+------------------+--------+ ICA Prox  47      11                                stent    +----------+--------+--------+--------+------------------+--------+ ICA Mid   51      12                                         +----------+--------+--------+--------+------------------+--------+ ICA Distal54      14                                         +----------+--------+--------+--------+------------------+--------+ ECA       70      10                                         +----------+--------+--------+--------+------------------+--------+ +----------+--------+--------+----------------+-------------------+  PSV cm/sEDV cm/sDescribe        Arm Pressure (mmHG) +----------+--------+--------+----------------+-------------------+ [id number]               Multiphasic, WNL                    +----------+--------+--------+----------------+-------------------+ +---------+--------+--+--------+--+---------+ VertebralPSV cm/s51EDV cm/s13Antegrade +---------+--------+--+--------+--+---------+   Summary: Right Carotid: Velocities in the right ICA are consistent with a 1-39% stenosis.                Non-hemodynamically significant plaque <50% noted in the CCA. The                ECA appears <50% stenosed. Left Carotid: There is no evidence of stenosis in the left ICA. Patent CCA/ICA               stent with no evidence of significant stenosis. Vertebrals:  Bilateral vertebral arteries demonstrate antegrade flow. Subclavians: Normal flow hemodynamics were seen in bilateral subclavian              arteries. *See table(s) above for measurements and observations.  Electronically signed by Selinda Gu MD on 07/22/2024 at 8:47:02 AM.    Final     Assessment/Plan  Hyperlipidemia lipid control important in reducing the progression of atherosclerotic disease. Continue statin therapy   PAD (peripheral artery disease) (HCC) ABIs today are 1.2 bilaterally with multiphasic waveforms and good digital pressures.  Aortoiliac duplex demonstrates no hemodynamically significant stenosis in the aortoiliac arteries and no aneurysms.  His previous stents appear to be largely patent.  Continue current medical regimen.  Follow-up in 1 year.  Carotid artery stenosis Carotid duplex today shows stable 1 to 39% right ICA stenosis and a widely patent left carotid stent.  No change in medical regimen.  Continue annual surveillance duplex for follow-up.  Hypertension blood pressure control important in reducing the progression of atherosclerotic disease. On appropriate oral medications.     Aortic atherosclerosis (HCC) Status post aortoiliac intervention several years ago and doing well.   Hyperlipidemia lipid control important in reducing the progression of  atherosclerotic disease. Continue statin therapy  Selinda Gu, MD  07/22/2024 9:09 AM    This note was created with Dragon medical transcription system.  Any errors from dictation are purely unintentional

## 2024-07-22 NOTE — Assessment & Plan Note (Signed)
 lipid control important in reducing the progression of atherosclerotic disease. Continue statin therapy

## 2024-07-22 NOTE — Assessment & Plan Note (Signed)
 ABIs today are 1.2 bilaterally with multiphasic waveforms and good digital pressures.  Aortoiliac duplex demonstrates no hemodynamically significant stenosis in the aortoiliac arteries and no aneurysms.  His previous stents appear to be largely patent.  Continue current medical regimen.  Follow-up in 1 year.

## 2024-10-13 ENCOUNTER — Other Ambulatory Visit (INDEPENDENT_AMBULATORY_CARE_PROVIDER_SITE_OTHER): Payer: Self-pay | Admitting: Vascular Surgery

## 2024-11-11 NOTE — Progress Notes (Deleted)
 [facility] [address] [place], [place] [address]  Pulmonary Sleep Medicine   Office Visit Note  Patient Name: GRIFFON HERBERG DOB: 1956/02/26 MRN [id number]    Chief Complaint: Obstructive Sleep Apnea visit  Brief History:  Casper is seen today for an annual follow up visit for CPAP@ 18 cmH2O. The patient has a 9 year history of sleep apnea. Patient is using PAP nightly.  The patient feels rested after sleeping with PAP.  The patient reports benefiting from PAP use. Reported sleepiness is  improved and the Epworth Sleepiness Score is *** out of 24. The patient *** take naps. The patient complains of the following: ***  The compliance download shows 100% compliance with an average use time of 10 hours 10 minutes. The AHI is 1.8.  The patient *** of limb movements disrupting sleep. The patient continues to require PAP therapy in order to eliminate sleep apnea.   ROS  General: (-) fever, (-) chills, (-) night sweat Nose and Sinuses: (-) nasal stuffiness or itchiness, (-) postnasal drip, (-) nosebleeds, (-) sinus trouble. Mouth and Throat: (-) sore throat, (-) hoarseness. Neck: (-) swollen glands, (-) enlarged thyroid, (-) neck pain. Respiratory: *** cough, *** shortness of breath, *** wheezing. Neurologic: *** numbness, *** tingling. Psychiatric: *** anxiety, *** depression   Current Medication: Outpatient Encounter Medications as of 11/14/2024  Medication Sig   acetaminophen  (TYLENOL ) 650 MG CR tablet Take 650 mg by mouth every 8 (eight) hours as needed for pain.   albuterol  (PROVENTIL  HFA;VENTOLIN  HFA) 108 (90 Base) MCG/ACT inhaler Inhale 2 puffs into the lungs every 4 (four) hours as needed for wheezing.   amLODipine  (NORVASC ) 5 MG tablet Take 5 mg by mouth daily.   carvedilol  (COREG ) 6.25 MG tablet Take 6.25 mg by mouth 2 (two) times daily with a meal.   clopidogrel  (PLAVIX ) 75 MG tablet Take 1 tablet by mouth once daily   escitalopram  (LEXAPRO ) 10 MG tablet  Take 10 mg by mouth in the morning.   ezetimibe (ZETIA) 10 MG tablet SMARTSIG:1.0 Tablet(s) By Mouth Daily (Patient not taking: Reported on 07/22/2024)   furosemide  (LASIX ) 20 MG tablet Take 20 mg by mouth daily.   isosorbide  mononitrate (IMDUR ) 30 MG 24 hr tablet SMARTSIG:1.0 Tablet(s) By Mouth Daily   ketoconazole (NIZORAL) 2 % shampoo as needed.   lovastatin (MEVACOR) 40 MG tablet Take 40 mg by mouth at bedtime.   Melatonin 3 MG CAPS Take 3 mg by mouth at bedtime.   metFORMIN (GLUCOPHAGE) 500 MG tablet Take 500 mg by mouth 2 (two) times daily with a meal.   Multiple Vitamin (MULTI-VITAMINS) TABS Take 1 tablet by mouth daily.   niacin (SLO-NIACIN) 500 MG tablet Take 500 mg by mouth at bedtime.   nitroGLYCERIN  (NITROSTAT ) 0.4 MG SL tablet Place 1 tablet (0.4 mg total) under the tongue every 5 (five) minutes as needed for chest pain.   pantoprazole  (PROTONIX ) 40 MG tablet Take 40 mg by mouth daily.   umeclidinium-vilanterol (ANORO ELLIPTA ) 62.5-25 MCG/ACT AEPB Inhale 1 puff into the lungs daily.   valsartan (DIOVAN) 160 MG tablet Take 160 mg by mouth 2 (two) times daily.   No facility-administered encounter medications on file as of 11/14/2024.    Surgical History: Past Surgical History:  Procedure Laterality Date   APPENDECTOMY     CARDIAC CATHETERIZATION     CAROTID ENDARTERECTOMY Left    CAROTID PTA/STENT INTERVENTION Left 08/20/2020   Procedure: CAROTID PTA/STENT INTERVENTION;  Surgeon: Marea Selinda RAMAN, MD;  Location: [facility] INV[facility]IVE [facility];  Service: Cardiovascular;  Laterality: Left;   CATARACT EXTRACTION Left    CATARACT EXTRACTION W/PHACO Right 05/24/2018   Procedure: CATARACT EXTRACTION PHACO AND INTRAOCULAR LENS PLACEMENT (IOC) RIGHT;  Surgeon: Myrna Adine Anes, MD;  Location: [facility];  Service: Ophthalmology;  Laterality: Right;  CPAP, sleep apnea   COLONOSCOPY N/A 05/29/2015   Procedure: COLONOSCOPY;  Surgeon: Gladis RAYMOND Mariner, MD;  Location: [facility] ENDOSCOPY;  Service:  Endoscopy;  Laterality: N/A;   iliac stents     LEFT HEART CATH AND CORONARY ANGIOGRAPHY N/A 12/03/2021   Procedure: LEFT HEART CATH AND CORONARY ANGIOGRAPHY and possible PCI and stent;  Surgeon: Hester Wolm PARAS, MD;  Location: [facility] INVASIVE CV LAB;  Service: Cardiovascular;  Laterality: N/A;   TONSILLECTOMY      Medical History: Past Medical History:  Diagnosis Date   Anemia    Anxiety    Arthritis    fingers and hands   Cancer (HCC)    skin cancer   COPD (chronic obstructive pulmonary disease) (HCC)    wheezing   Coronary artery disease    Dyspnea    GERD (gastroesophageal reflux disease)    Headache    2-3 times/ week   History of hiatal hernia    HOH (hard of hearing)    Hypercholesteremia    Hypertension    controlled   Pneumonia    Sleep apnea    CPAP   Wears dentures    upper    Family History: Non contributory to the present illness  Social History: Social History   Socioeconomic History   Marital status: Married    Spouse name: Arlyne Rima    Number of children: 1   Years of education: Not on file   Highest education level: Not on file  Occupational History   Occupation: retired   Tobacco Use   Smoking status: Former    Current packs/day: 0.00    Average packs/day: 0.5 packs/day for 40.0 years (20.0 ttl pk-yrs)    Types: Cigarettes    Start date: 11/29/1981    Quit date: 11/29/2021    Years since quitting: 2.9   Smokeless tobacco: Former  Building Services Engineer status: Never Used  Substance and Sexual Activity   Alcohol use: Yes    Comment: rarely   Drug use: Never   Sexual activity: Not on file  Other Topics Concern   Not on file  Social History Narrative   Lives at home with wife.    Social Drivers of Corporate Investment Banker Strain: Low Risk  (03/29/2024)   Received from [facility]   Overall Financial Resource Strain (CARDIA)    Difficulty of Paying Living Expenses: Not very hard  Food Insecurity: No Food  Insecurity (03/29/2024)   Received from [facility]   Hunger Vital Sign    Within the past 12 months, you worried that your food would run out before you got the money to buy more.: Never true    Within the past 12 months, the food you bought just didn't last and you didn't have money to get more.: Never true  Transportation Needs: No Transportation Needs (03/29/2024)   Received from [facility] - Transportation    In the past 12 months, has lack of transportation kept you from medical appointments or from getting medications?: No    Lack of Transportation (Non-Medical): No  Physical Activity: Not on  file  Stress: Not on file  Social Connections: Not on file  Intimate Partner Violence: Not on file    Vital Signs: There were no vitals taken for this visit. There is no height or weight on file to calculate BMI.    Examination: General Appearance: The patient is well-developed, well-nourished, and in no distress. Neck Circumference: *** Skin: Gross inspection of skin unremarkable. Head: normocephalic, no gross deformities. Eyes: no gross deformities noted. ENT: ears appear grossly normal Neurologic: Alert and oriented. No involuntary movements.  STOP BANG RISK ASSESSMENT S (snore) Have you been told that you snore?     YES/N   T (tired) Are you often tired, fatigued, or sleepy during the day?   YES/NO  O (obstruction) Do you stop breathing, choke, or gasp during sleep? YES/NO   P (pressure) Do you have or are you being treated for high blood pressure? YES/NO   B (BMI) Is your body index greater than 35 kg/m? YES/NO   A (age) Are you 33 years old or older? YES   N (neck) Do you have a neck circumference greater than 16 inches?   YES/NO   G (gender) Are you a male? YES   TOTAL STOP/BANG "YES" ANSWERS        A STOP-Bang score of 2 or less is considered low risk, and a score of 5 or more is high risk for having either moderate or  severe OSA. For people who score 3 or 4, doctors may need to perform further assessment to determine how likely they are to have OSA.         EPWORTH SLEEPINESS SCALE:  Scale:  (0)= no chance of dozing; (1)= slight chance of dozing; (2)= moderate chance of dozing; (3)= high chance of dozing  Chance  Situtation    Sitting and reading: ***    Watching TV: ***    Sitting Inactive in public: ***    As a passenger in car: ***      Lying down to rest: ***    Sitting and talking: ***    Sitting quielty after lunch: ***    In a car, stopped in traffic: ***   TOTAL SCORE:   *** out of 24    SLEEP STUDIES:  PSG (07/2016) AHI 13.4/hr, Supine AHI 22/hr, min SpO2 81% Titration (08/2016) CPAP@ 20 cmH2O   CPAP COMPLIANCE DATA:  Date Range: 11/11/2023-11/09/2024  Average Daily Use: 10 hours 10 minutes  Median Use: 10 hours 10 minutes  Compliance for > 4 Hours: 100%  AHI: 1.8 respiratory events per hour  Days Used: 365/365 days  Mask Leak: 77.6  95th Percentile Pressure: 18         LABS: No results found for this or any previous visit (from the past 2160 hours).  Radiology: CT CHEST LUNG CA SCREEN LOW DOSE W/O CM Result Date: 05/31/2024 CLINICAL DATA:  52 pack-year smoking history/quit 3 years ago EXAM: CT CHEST WITHOUT CONTRAST LOW-DOSE FOR LUNG CANCER SCREENING TECHNIQUE: Multidetector CT imaging of the chest was performed following the standard protocol without IV contrast. RADIATION DOSE REDUCTION: This exam was performed according to the departmental dose-optimization program which includes automated exposure control, adjustment of the mA and/or kV according to patient size and/or use of iterative reconstruction technique. COMPARISON:  05/13/2023 FINDINGS: Cardiovascular: Aortic atherosclerosis. Mild cardiomegaly. Median sternotomy for CABG. Mediastinum/Nodes: No mediastinal or hilar adenopathy, given limitations of unenhanced CT. Lungs/Pleura: No pleural fluid.  Moderate centrilobular emphysema. Left lower lobe scarring  laterally. Pulmonary nodules of maximally volume derived equivalent diameter 5.3 mm. Upper Abdomen: Moderate hepatic steatosis. Normal imaged portions of the spleen, pancreas, gallbladder, adrenal glands, left kidney. Tiny hiatal hernia. Musculoskeletal: The median sternotomy is nonunited. There may be fractures within 2 posterior sternal wires including on 26/3 and 22/3. IMPRESSION: 1. Lung-RADS 2, benign appearance or behavior. Continue annual screening with low-dose chest CT without contrast in 12 months. 2. Hepatic steatosis. 3. Tiny hiatal hernia. 4. Aortic Atherosclerosis (ICD10-I70.0) and Emphysema (ICD10-J43.9). Electronically Signed   By: Rockey Kilts M.D.   On: 05/31/2024 11:22    No results found.  No results found.    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 12/07/2021   S/P CABG x 3 12/07/2021   Type 2 diabetes mellitus (HCC) 12/04/2021   Current smoker 12/03/2021   NSTEMI (non-ST elevated myocardial infarction) (HCC) 11/29/2021   OSA on CPAP 08/05/2021   CPAP use counseling 08/05/2021   Obesity (BMI 30-39.9) 08/05/2021   COPD (chronic obstructive pulmonary disease) (HCC) 11/05/2020   Severe tobacco use disorder 11/05/2020   Carotid stenosis, left 08/20/2020   Hyperlipidemia 07/06/2018   Hypertension 07/07/2017   Aortic atherosclerosis 07/07/2017   Carotid artery stenosis 07/07/2017   PAD (peripheral artery disease) 07/07/2017   Benign essential hypertension 06/14/2015   Coronary artery disease 02/06/2015   Complete left bundle branch block 01/19/2015   Psoriasis 01/10/2015   OSA (obstructive sleep apnea) 12/19/2014   Anxiety 07/06/2014   GERD  (gastroesophageal reflux disease) 07/06/2014   Fracture of metatarsal of left foot, open 12/06/2013   Peripheral artery disease 12/08/2012      The patient *** tolerate PAP and reports *** benefit from PAP use. The patient was reminded how to *** and advised to ***. The patient was also counselled on ***. The compliance is ***. The AHI is ***.   ***  General Counseling: I have discussed the findings of the evaluation and examination with Tanda.  I have also discussed any further diagnostic evaluation thatmay be needed or ordered today. Selig verbalizes understanding of the findings of todays visit. We also reviewed his medications today and discussed drug interactions and side effects including but not limited excessive drowsiness and altered mental states. We also discussed that there is always a risk not just to him but also people around him. he has been encouraged to call the office with any questions or concerns that should arise related to todays visit.  No orders of the defined types were placed in this encounter.       I have personally obtained a history, examined the patient, evaluated laboratory and imaging results, formulated the assessment and plan and placed orders.  Elfreda DELENA Bathe, MD [facility] Orthopaedic Surgery Diplomate ABMS Pulmonary Critical Care Medicine and Sleep Medicine

## 2024-11-14 ENCOUNTER — Ambulatory Visit

## 2024-12-17 NOTE — Progress Notes (Signed)
 [facility] [address] [place], [place] [address]  Pulmonary Sleep Medicine   Office Visit Note  Patient Name: Charles Blake DOB: Jan 07, 1956 MRN [id number]    Chief Complaint: Obstructive Sleep Apnea visit  Brief History:  Charles Blake is seen today for an annual follow up on CPAP @ 18 cmH2O. The patient has a 9 year history of sleep apnea. Patient is using PAP nightly.  The patient feels rested after sleeping with PAP.  The patient reports benefit from PAP use. Reported sleepiness is improved and the Epworth Sleepiness Score is 0 out of 24. The patient does not take naps. The patient complains of the following: needs new equipment today. The compliance download shows 100% compliance with an average use time of 10 hours 8 minutes. The AHI is 1.8  The patient does not complain of limb movements disrupting sleep.  ROS  General: (-) fever, (-) chills, (-) night sweat Nose and Sinuses: (-) nasal stuffiness or itchiness, (-) postnasal drip, (-) nosebleeds, (-) sinus trouble. Mouth and Throat: (-) sore throat, (-) hoarseness. Neck: (-) swollen glands, (-) enlarged thyroid, (-) neck pain. Respiratory: + cough, + shortness of breath, + wheezing. Neurologic: - numbness, - tingling. Psychiatric: + anxiety, - depression   Current Medication: Outpatient Encounter Medications as of 12/19/2024  Medication Sig   acetaminophen  (TYLENOL ) 650 MG CR tablet Take 650 mg by mouth every 8 (eight) hours as needed for pain.   albuterol  (PROVENTIL  HFA;VENTOLIN  HFA) 108 (90 Base) MCG/ACT inhaler Inhale 2 puffs into the lungs every 4 (four) hours as needed for wheezing.   amLODipine  (NORVASC ) 5 MG tablet Take 5 mg by mouth daily.   carvedilol  (COREG ) 6.25 MG tablet Take 6.25 mg by mouth 2 (two) times daily with a meal.   clopidogrel  (PLAVIX ) 75 MG tablet Take 1 tablet by mouth once daily   escitalopram  (LEXAPRO ) 10 MG tablet Take 10 mg by mouth in the morning.   ezetimibe (ZETIA) 10 MG tablet  SMARTSIG:1.0 Tablet(s) By Mouth Daily (Patient not taking: Reported on 07/22/2024)   furosemide  (LASIX ) 20 MG tablet Take 20 mg by mouth daily.   isosorbide  mononitrate (IMDUR ) 30 MG 24 hr tablet SMARTSIG:1.0 Tablet(s) By Mouth Daily   ketoconazole (NIZORAL) 2 % shampoo as needed.   lovastatin (MEVACOR) 40 MG tablet Take 40 mg by mouth at bedtime.   Melatonin 3 MG CAPS Take 3 mg by mouth at bedtime.   metFORMIN (GLUCOPHAGE) 500 MG tablet Take 500 mg by mouth 2 (two) times daily with a meal.   Multiple Vitamin (MULTI-VITAMINS) TABS Take 1 tablet by mouth daily.   niacin (SLO-NIACIN) 500 MG tablet Take 500 mg by mouth at bedtime.   nitroGLYCERIN  (NITROSTAT ) 0.4 MG SL tablet Place 1 tablet (0.4 mg total) under the tongue every 5 (five) minutes as needed for chest pain.   pantoprazole  (PROTONIX ) 40 MG tablet Take 40 mg by mouth daily.   umeclidinium-vilanterol (ANORO ELLIPTA ) 62.5-25 MCG/ACT AEPB Inhale 1 puff into the lungs daily.   valsartan (DIOVAN) 160 MG tablet Take 160 mg by mouth 2 (two) times daily.   No facility-administered encounter medications on file as of 12/19/2024.    Surgical History: Past Surgical History:  Procedure Laterality Date   APPENDECTOMY     CARDIAC CATHETERIZATION     CAROTID ENDARTERECTOMY Left    CAROTID PTA/STENT INTERVENTION Left 08/20/2020   Procedure: CAROTID PTA/STENT INTERVENTION;  Surgeon: Charles Selinda RAMAN, MD;  Location: [facility] INVASIVE CV LAB;  Service: Cardiovascular;  Laterality: Left;   CATARACT EXTRACTION Left    CATARACT EXTRACTION W/PHACO Right 05/24/2018   Procedure: CATARACT EXTRACTION PHACO AND INTRAOCULAR LENS PLACEMENT (IOC) RIGHT;  Surgeon: Charles Adine Anes, MD;  Location: [facility];  Service: Ophthalmology;  Laterality: Right;  CPAP, sleep apnea   COLONOSCOPY N/A 05/29/2015   Procedure: COLONOSCOPY;  Surgeon: Charles RAYMOND Mariner, MD;  Location: [facility] ENDOSCOPY;  Service: Endoscopy;  Laterality: N/A;   iliac stents     LEFT HEART CATH AND  CORONARY ANGIOGRAPHY N/A 12/03/2021   Procedure: LEFT HEART CATH AND CORONARY ANGIOGRAPHY and possible PCI and stent;  Surgeon: Charles Wolm PARAS, MD;  Location: [facility] INVASIVE CV LAB;  Service: Cardiovascular;  Laterality: N/A;   TONSILLECTOMY      Medical History: Past Medical History:  Diagnosis Date   Anemia    Anxiety    Arthritis    fingers and hands   Cancer (HCC)    skin cancer   COPD (chronic obstructive pulmonary disease) (HCC)    wheezing   Coronary artery disease    Dyspnea    GERD (gastroesophageal reflux disease)    Headache    2-3 times/ week   History of hiatal hernia    HOH (hard of hearing)    Hypercholesteremia    Hypertension    controlled   Pneumonia    Sleep apnea    CPAP   Wears dentures    upper    Family History: Non contributory to the present illness  Social History: Social History   Socioeconomic History   Marital status: Married    Spouse name: Charles Blake    Number of children: 1   Years of education: Not on file   Highest education level: Not on file  Occupational History   Occupation: retired   Tobacco Use   Smoking status: Former    Current packs/day: 0.00    Average packs/day: 0.5 packs/day for 40.0 years (20.0 ttl pk-yrs)    Types: Cigarettes    Start date: 11/29/1981    Quit date: 11/29/2021    Years since quitting: 3.0   Smokeless tobacco: Former  Building Services Engineer status: Never Used  Substance and Sexual Activity   Alcohol use: Yes    Comment: rarely   Drug use: Never   Sexual activity: Not on file  Other Topics Concern   Not on file  Social History Narrative   Lives at home with wife.    Social Drivers of Health   Tobacco Use: Medium Risk (11/21/2024)   Received from [facility]   Patient History    Smoking Tobacco Use: Former    Smokeless Tobacco Use: Former    Passive Exposure: Not on Actuary Strain: Low Risk  (11/21/2024)   [facility]   Overall Financial Resource Strain (CARDIA)    Difficulty of Paying Living Expenses: Not hard at all  Food Insecurity: No Food Insecurity (11/21/2024)   Received from [facility]   Epic    Within the past 12 months, you worried that your food would run out before you got the money to buy more.: Never true    Within the past 12 months, the food you bought just didn't last and you didn't have money to get more.: Never true  Transportation Needs: No Transportation Needs (11/21/2024)   Received from [facility] - [facility]    In the  past 12 months, has lack of transportation kept you from medical appointments or from getting medications?: No    Lack of Transportation (Non-Medical): No  Physical Activity: Not on file  Stress: Not on file  Social Connections: Not on file  Intimate Partner Violence: Not on file  Depression (PHQ2-9): Low Risk (04/10/2022)   Depression (PHQ2-9)    PHQ-2 Score: 2  Alcohol Screen: Not on file  Housing: Low Risk  (11/21/2024)   Received from [facility]   Epic    In the last 12 months, was there a time when you were not able to pay the mortgage or rent on time?: No    In the past 12 months, how many times have you moved where you were living?: 0    At any time in the past 12 months, were you homeless or living in a shelter (including now)?: No  Utilities: Not At Risk (11/21/2024)   Received from [facility]   Epic    In the past 12 months has the electric, gas, oil, or water company threatened to shut off services in your home?: No  Health Literacy: Not on file    Vital Signs: There were no vitals taken for this visit. There is no height or weight on file to calculate BMI.    Examination: General Appearance: The patient is well-developed, well-nourished, and in no distress. Neck Circumference: 48 cm Skin: Gross inspection of skin unremarkable. Head:  normocephalic, no gross deformities. Eyes: no gross deformities noted. ENT: ears appear grossly normal Neurologic: Alert and oriented. No involuntary movements.  STOP BANG RISK ASSESSMENT S (snore) Have you been told that you snore?     YES   T (tired) Are you often tired, fatigued, or sleepy during the day?   YES  O (obstruction) Do you stop breathing, choke, or gasp during sleep? NO   P (pressure) Do you have or are you being treated for high blood pressure? YES   B (BMI) Is your body index greater than 35 kg/m? YES   A (age) Are you 22 years old or older? YES   N (neck) Do you have a neck circumference greater than 16 inches?   YES   G (gender) Are you a male? YES   TOTAL STOP/BANG YES ANSWERS 7       A STOP-Bang score of 2 or less is considered low risk, and a score of 5 or more is high risk for having either moderate or severe OSA. For people who score 3 or 4, doctors may need to perform further assessment to determine how likely they are to have OSA.         EPWORTH SLEEPINESS SCALE:  Scale:  (0)= no chance of dozing; (1)= slight chance of dozing; (2)= moderate chance of dozing; (3)= high chance of dozing  Chance  Situtation    Sitting and reading: 0    Watching TV: 0    Sitting Inactive in public: 0    As a passenger in car: 0      Lying down to rest: 0    Sitting and talking: 0    Sitting quielty after lunch: 0    In a car, stopped in traffic: 0   TOTAL SCORE:   0 out of 24    SLEEP STUDIES:  PSG (07/2016) AHI 13.4/hr, Supine AHI 22/hr, min Sp02 81% Titration (08/2016) CPAP @ 20cmH2O   CPAP COMPLIANCE DATA:  Date Range: 12/16/2023 -  12/14/2024   Average Daily Use: 10 hours 8 minutes  Median Use: 10 hours 8 minutes  Compliance for > 4 Hours: 100% days  AHI: 1.8 respiratory events per hour  Days Used: 365/365  Mask Leak: 77.7  95th Percentile Pressure: 18 cmH2O         LABS: No results found for this or any previous visit  (from the past 2160 hours).  Radiology: CT CHEST LUNG CA SCREEN LOW DOSE W/O CM Result Date: 05/31/2024 CLINICAL DATA:  52 pack-year smoking history/quit 3 years ago EXAM: CT CHEST WITHOUT CONTRAST LOW-DOSE FOR LUNG CANCER SCREENING TECHNIQUE: Multidetector CT imaging of the chest was performed following the standard protocol without IV contrast. RADIATION DOSE REDUCTION: This exam was performed according to the departmental dose-optimization program which includes automated exposure control, adjustment of the mA and/or kV according to patient size and/or use of iterative reconstruction technique. COMPARISON:  05/13/2023 FINDINGS: Cardiovascular: Aortic atherosclerosis. Mild cardiomegaly. Median sternotomy for CABG. Mediastinum/Nodes: No mediastinal or hilar adenopathy, given limitations of unenhanced CT. Lungs/Pleura: No pleural fluid. Moderate centrilobular emphysema. Left lower lobe scarring laterally. Pulmonary nodules of maximally volume derived equivalent diameter 5.3 mm. Upper Abdomen: Moderate hepatic steatosis. Normal imaged portions of the spleen, pancreas, gallbladder, adrenal glands, left kidney. Tiny hiatal hernia. Musculoskeletal: The median sternotomy is nonunited. There may be fractures within 2 posterior sternal wires including on 26/3 and 22/3. IMPRESSION: 1. Lung-RADS 2, benign appearance or behavior. Continue annual screening with low-dose chest CT without contrast in 12 months. 2. Hepatic steatosis. 3. Tiny hiatal hernia. 4. Aortic Atherosclerosis (ICD10-I70.0) and Emphysema (ICD10-J43.9). Electronically Signed   By: Rockey Kilts M.D.   On: 05/31/2024 11:22    No results found.  No results found.    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 12/07/2021   S/P CABG x 3 12/07/2021   Type 2 diabetes mellitus (HCC) 12/04/2021   Current smoker 12/03/2021   NSTEMI (non-ST elevated myocardial infarction) (HCC) 11/29/2021   OSA on CPAP 08/05/2021   CPAP use counseling 08/05/2021   Obesity (BMI 30-39.9) 08/05/2021   COPD (chronic obstructive pulmonary disease) (HCC) 11/05/2020   Severe tobacco use disorder 11/05/2020   Carotid stenosis, left 08/20/2020   Hyperlipidemia 07/06/2018   Hypertension 07/07/2017   Aortic atherosclerosis 07/07/2017   Carotid artery stenosis 07/07/2017   PAD (peripheral artery disease) 07/07/2017   Benign essential hypertension 06/14/2015   Coronary artery disease 02/06/2015   Complete left bundle branch block 01/19/2015   Psoriasis 01/10/2015   OSA (obstructive sleep apnea) 12/19/2014   Anxiety 07/06/2014   GERD (gastroesophageal reflux disease) 07/06/2014   Fracture of metatarsal of left foot, open 12/06/2013   Peripheral artery disease 12/08/2012   1. OSA on CPAP (Primary) The patient does tolerate PAP and reports  benefit from PAP use. The patient was reminded how to clean equipment and advised to replace supplies routinely. The patient was also counselled on weight loss. The compliance is excellent. The AHI is 1.8.   OSA on cpap- controlled. Continue with excellent compliance with pap. CPAP continues to be medically necessary to treat this patient's OSA. F/u one year.    2. CPAP use counseling CPAP Counseling: had a lengthy discussion with the patient regarding the importance of  PAP therapy in management of the sleep apnea. Patient appears to understand the risk factor reduction and also understands the risks associated with untreated sleep apnea. Patient will try to make a good faith effort to remain compliant with therapy. Also instructed the patient on proper cleaning of the device including the water must be changed daily if possible and use of distilled water is  preferred. Patient understands that the machine should be regularly cleaned with appropriate recommended cleaning solutions that do not damage the PAP machine for example given white vinegar and water rinses. Other methods such as ozone treatment may not be as good as these simple methods to achieve cleaning.   3. Benign essential hypertension Hypertension Counseling:   The following hypertensive lifestyle modification were recommended and discussed:  1. Limiting alcohol intake to less than 1 oz/day of ethanol:(24 oz of beer or 8 oz of wine or 2 oz of 100-proof whiskey). 2. Take baby ASA 81 mg daily. 3. Importance of regular aerobic exercise and losing weight. 4. Reduce dietary saturated fat and cholesterol intake for overall cardiovascular health. 5. Maintaining adequate dietary potassium, calcium , and magnesium  intake. 6. Regular monitoring of the blood pressure. 7. Reduce sodium intake to less than 100 mmol/day (less than 2.3 gm of sodium or less than 6 gm of sodium choride)    4. Obesity (BMI 30-39.9) Discussed- patient may be a good candidate for zepbound. He will discuss with his doctor.      General Counseling: I have discussed the findings of the evaluation and examination with Tanda.  I have also discussed any further diagnostic evaluation thatmay be needed or ordered today. Marcellino verbalizes understanding of the findings of todays visit. We also reviewed his medications today and discussed drug interactions and side effects including but not limited excessive drowsiness and altered mental states. We also discussed that there is always a risk not just to him but also people around him. he has been encouraged to call the office with any questions or concerns that should arise related to todays visit.  No orders of the defined types were placed in this encounter.       I have personally obtained a history, examined the patient, evaluated laboratory and imaging results, formulated  the assessment and plan and placed orders. This patient was seen today by Lauraine Lay, PA-C in collaboration with Dr. Elfreda Bathe.   Elfreda DELENA Bathe, MD [facility] Diplomate ABMS Pulmonary Critical Care Medicine and Sleep Medicine

## 2024-12-19 ENCOUNTER — Ambulatory Visit (INDEPENDENT_AMBULATORY_CARE_PROVIDER_SITE_OTHER): Admitting: Internal Medicine

## 2024-12-19 VITALS — BP 154/80 | HR 67 | Resp 16 | Ht 66.0 in | Wt 235.0 lb

## 2024-12-19 DIAGNOSIS — I10 Essential (primary) hypertension: Secondary | ICD-10-CM | POA: Diagnosis not present

## 2024-12-19 DIAGNOSIS — G4733 Obstructive sleep apnea (adult) (pediatric): Secondary | ICD-10-CM | POA: Diagnosis not present

## 2024-12-19 DIAGNOSIS — Z7189 Other specified counseling: Secondary | ICD-10-CM

## 2024-12-19 DIAGNOSIS — E669 Obesity, unspecified: Secondary | ICD-10-CM | POA: Diagnosis not present

## 2024-12-19 NOTE — Patient Instructions (Signed)

## 2025-01-08 ENCOUNTER — Other Ambulatory Visit (INDEPENDENT_AMBULATORY_CARE_PROVIDER_SITE_OTHER): Payer: Self-pay | Admitting: Nurse Practitioner

## 2025-07-21 ENCOUNTER — Ambulatory Visit (INDEPENDENT_AMBULATORY_CARE_PROVIDER_SITE_OTHER): Admitting: Nurse Practitioner

## 2025-07-21 ENCOUNTER — Encounter (INDEPENDENT_AMBULATORY_CARE_PROVIDER_SITE_OTHER)
